# Patient Record
Sex: Female | Born: 1980 | Race: Black or African American | Hispanic: No | Marital: Single | Smoking: Never smoker
Health system: Southern US, Community
[De-identification: ages and names within clinical notes are randomized; demographics above are authoritative.]

## PROBLEM LIST (undated history)

## (undated) ENCOUNTER — Inpatient Hospital Stay (HOSPITAL_COMMUNITY): Payer: Self-pay

## (undated) DIAGNOSIS — L309 Dermatitis, unspecified: Secondary | ICD-10-CM

## (undated) DIAGNOSIS — Z3490 Encounter for supervision of normal pregnancy, unspecified, unspecified trimester: Secondary | ICD-10-CM

## (undated) DIAGNOSIS — O139 Gestational [pregnancy-induced] hypertension without significant proteinuria, unspecified trimester: Secondary | ICD-10-CM

## (undated) DIAGNOSIS — J302 Other seasonal allergic rhinitis: Secondary | ICD-10-CM

## (undated) HISTORY — PX: NO PAST SURGERIES: SHX2092

## (undated) HISTORY — PX: OTHER SURGICAL HISTORY: SHX169

## (undated) HISTORY — PX: UPPER GI ENDOSCOPY: SHX6162

---

## 2004-06-07 ENCOUNTER — Ambulatory Visit: Payer: Self-pay | Admitting: Internal Medicine

## 2004-06-14 ENCOUNTER — Ambulatory Visit: Payer: Self-pay | Admitting: Internal Medicine

## 2004-06-21 ENCOUNTER — Ambulatory Visit: Payer: Self-pay | Admitting: Internal Medicine

## 2004-11-28 ENCOUNTER — Ambulatory Visit: Payer: Self-pay | Admitting: Internal Medicine

## 2004-12-08 ENCOUNTER — Ambulatory Visit: Payer: Self-pay | Admitting: Internal Medicine

## 2005-05-09 ENCOUNTER — Ambulatory Visit: Payer: Self-pay | Admitting: Internal Medicine

## 2006-02-15 ENCOUNTER — Ambulatory Visit: Payer: Self-pay | Admitting: Obstetrics and Gynecology

## 2006-02-15 ENCOUNTER — Inpatient Hospital Stay (HOSPITAL_COMMUNITY): Admission: AD | Admit: 2006-02-15 | Discharge: 2006-02-15 | Payer: Self-pay | Admitting: Family Medicine

## 2006-02-23 ENCOUNTER — Other Ambulatory Visit: Admission: RE | Admit: 2006-02-23 | Discharge: 2006-02-23 | Payer: Self-pay | Admitting: Obstetrics and Gynecology

## 2006-03-26 ENCOUNTER — Inpatient Hospital Stay (HOSPITAL_COMMUNITY): Admission: AD | Admit: 2006-03-26 | Discharge: 2006-03-30 | Payer: Self-pay | Admitting: Obstetrics and Gynecology

## 2006-10-11 ENCOUNTER — Emergency Department (HOSPITAL_COMMUNITY): Admission: EM | Admit: 2006-10-11 | Discharge: 2006-10-11 | Payer: Self-pay | Admitting: Emergency Medicine

## 2009-10-04 ENCOUNTER — Emergency Department (HOSPITAL_COMMUNITY): Admission: EM | Admit: 2009-10-04 | Discharge: 2009-10-04 | Payer: Self-pay | Admitting: Emergency Medicine

## 2010-07-03 LAB — POCT I-STAT, CHEM 8
BUN: 6 mg/dL (ref 6–23)
Calcium, Ion: 1.11 mmol/L — ABNORMAL LOW (ref 1.12–1.32)
Chloride: 105 mEq/L (ref 96–112)
Hemoglobin: 12.9 g/dL (ref 12.0–15.0)
Sodium: 139 mEq/L (ref 135–145)

## 2010-07-03 LAB — URINALYSIS, ROUTINE W REFLEX MICROSCOPIC
Bilirubin Urine: NEGATIVE
Hgb urine dipstick: NEGATIVE
Urobilinogen, UA: 0.2 mg/dL (ref 0.0–1.0)
pH: 6 (ref 5.0–8.0)

## 2010-10-06 ENCOUNTER — Emergency Department (HOSPITAL_BASED_OUTPATIENT_CLINIC_OR_DEPARTMENT_OTHER)
Admission: EM | Admit: 2010-10-06 | Discharge: 2010-10-06 | Disposition: A | Discharge status: Home / Self Care | Payer: Self-pay | Attending: Emergency Medicine | Admitting: Emergency Medicine

## 2010-10-06 ENCOUNTER — Emergency Department (INDEPENDENT_AMBULATORY_CARE_PROVIDER_SITE_OTHER): Payer: Self-pay

## 2010-10-06 DIAGNOSIS — R51 Headache: Secondary | ICD-10-CM | POA: Insufficient documentation

## 2010-10-06 DIAGNOSIS — I10 Essential (primary) hypertension: Secondary | ICD-10-CM | POA: Insufficient documentation

## 2011-01-29 ENCOUNTER — Emergency Department (HOSPITAL_COMMUNITY)
Admission: EM | Admit: 2011-01-29 | Discharge: 2011-01-29 | Disposition: A | Discharge status: Home / Self Care | Payer: No Typology Code available for payment source | Attending: Emergency Medicine | Admitting: Emergency Medicine

## 2011-01-29 ENCOUNTER — Emergency Department (HOSPITAL_COMMUNITY): Payer: No Typology Code available for payment source

## 2011-01-29 DIAGNOSIS — Y92410 Unspecified street and highway as the place of occurrence of the external cause: Secondary | ICD-10-CM | POA: Insufficient documentation

## 2011-01-29 DIAGNOSIS — S2249XA Multiple fractures of ribs, unspecified side, initial encounter for closed fracture: Secondary | ICD-10-CM | POA: Insufficient documentation

## 2011-01-29 DIAGNOSIS — R109 Unspecified abdominal pain: Secondary | ICD-10-CM | POA: Insufficient documentation

## 2011-01-29 DIAGNOSIS — I10 Essential (primary) hypertension: Secondary | ICD-10-CM | POA: Insufficient documentation

## 2011-01-29 DIAGNOSIS — R599 Enlarged lymph nodes, unspecified: Secondary | ICD-10-CM | POA: Insufficient documentation

## 2011-01-29 DIAGNOSIS — Y998 Other external cause status: Secondary | ICD-10-CM | POA: Insufficient documentation

## 2011-01-29 LAB — POCT I-STAT, CHEM 8
BUN: 6 mg/dL (ref 6–23)
Calcium, Ion: 1.17 mmol/L (ref 1.12–1.32)
Chloride: 103 mEq/L (ref 96–112)
Potassium: 3.7 mEq/L (ref 3.5–5.1)

## 2011-01-29 MED ORDER — IOHEXOL 300 MG/ML  SOLN
125.0000 mL | Freq: Once | INTRAMUSCULAR | Status: AC | PRN
  Administered 2011-01-29: 125 mL via INTRAVENOUS

## 2011-01-30 ENCOUNTER — Encounter: Payer: Self-pay | Admitting: Internal Medicine

## 2011-02-14 ENCOUNTER — Encounter: Payer: Self-pay | Admitting: Internal Medicine

## 2011-02-17 ENCOUNTER — Ambulatory Visit: Payer: No Typology Code available for payment source | Admitting: Internal Medicine

## 2011-04-14 ENCOUNTER — Ambulatory Visit: Payer: No Typology Code available for payment source | Admitting: Internal Medicine

## 2011-05-01 ENCOUNTER — Encounter: Payer: Self-pay | Admitting: Internal Medicine

## 2011-05-09 ENCOUNTER — Encounter: Payer: Self-pay | Admitting: Internal Medicine

## 2011-05-11 ENCOUNTER — Ambulatory Visit (INDEPENDENT_AMBULATORY_CARE_PROVIDER_SITE_OTHER): Payer: Self-pay | Admitting: Internal Medicine

## 2011-05-11 ENCOUNTER — Encounter: Payer: Self-pay | Admitting: Internal Medicine

## 2011-05-11 DIAGNOSIS — K625 Hemorrhage of anus and rectum: Secondary | ICD-10-CM

## 2011-05-11 DIAGNOSIS — R9389 Abnormal findings on diagnostic imaging of other specified body structures: Secondary | ICD-10-CM

## 2011-05-11 DIAGNOSIS — R933 Abnormal findings on diagnostic imaging of other parts of digestive tract: Secondary | ICD-10-CM

## 2011-05-11 HISTORY — DX: Hemorrhage of anus and rectum: K62.5

## 2011-05-11 MED ORDER — PEG-KCL-NACL-NASULF-NA ASC-C 100 G PO SOLR: 1.0000 | Freq: Once | ORAL | Status: DC

## 2011-05-11 NOTE — Patient Instructions (Signed)
You have been scheduled for a colonoscopy. Please follow written instructions given to you at your visit today.  Please pick up your prep kit at the pharmacy within the next 2-3 days.  You have been given a sample of Suprep, please follow the instructions you were given today at your visit.
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
## 2011-05-30 ENCOUNTER — Ambulatory Visit (AMBULATORY_SURGERY_CENTER): Payer: Self-pay | Admitting: Internal Medicine

## 2011-05-30 ENCOUNTER — Encounter: Payer: Self-pay | Admitting: Internal Medicine

## 2011-05-30 DIAGNOSIS — K625 Hemorrhage of anus and rectum: Secondary | ICD-10-CM

## 2011-05-30 DIAGNOSIS — R9389 Abnormal findings on diagnostic imaging of other specified body structures: Secondary | ICD-10-CM

## 2011-05-30 DIAGNOSIS — R933 Abnormal findings on diagnostic imaging of other parts of digestive tract: Secondary | ICD-10-CM

## 2011-05-30 MED ORDER — SODIUM CHLORIDE 0.9 % IV SOLN: 500.0000 mL | INTRAVENOUS | Status: DC

## 2011-05-30 NOTE — Progress Notes (Signed)
Patient did not have preoperative order for IV antibiotic SSI prophylaxis. (G8918)  Patient did not experience any of the following events: a burn prior to discharge; a fall within the facility; wrong site/side/patient/procedure/implant event; or a hospital transfer or hospital admission upon discharge from the facility. (G8907)  

## 2011-05-30 NOTE — Progress Notes (Signed)
FOLLOW DISCHARGE INSTRUCTIONS (BLUE AND GREEN SHEETS).. 

## 2011-05-30 NOTE — Progress Notes (Signed)
No complaints noted in the recovery room. Maw   

## 2011-05-30 NOTE — Op Note (Signed)
[facility] [address] [place], [place]  [address]  COLONOSCOPY PROCEDURE REPORT  PATIENT:  Holly [name], Holly [name]  MR#:  [id number] BIRTHDATE:  [birth year], 30 yrs. old  GENDER:  female ENDOSCOPIST:  Carie Caddy. Novis League, MD  PROCEDURE DATE:  05/30/2011 PROCEDURE:  Colonoscopy 19147 ASA CLASS:  Class I INDICATIONS:  Abnormal CT of abdomen, rectal bleeding MEDICATIONS:   propofol (Diprivan) 310 mg IV, MAC sedation, administered by CRNA  DESCRIPTION OF PROCEDURE:   After the risks benefits and alternatives of the procedure were thoroughly explained, informed consent was obtained.  Digital rectal exam was performed and revealed no rectal masses.   The [id number] endoscope was introduced through the anus and advanced to the terminal ileum which was intubated for a short distance, without limitations. The quality of the prep was good, using MoviPrep.  The instrument was then slowly withdrawn as the colon was fully examined. <<PROCEDUREIMAGES>>  FINDINGS:  The terminal ileum appeared normal.  A normal appearing cecum, ileocecal valve, and appendiceal orifice were identified. The ascending, hepatic flexure, transverse, splenic flexure, descending, sigmoid colon, and rectum appeared unremarkable. Retroflexed views in the rectum revealed no abnormalities.   The scope was then withdrawn from the cecum and the procedure completed.  COMPLICATIONS:  None ENDOSCOPIC IMPRESSION: 1) Normal terminal ileum 2) Normal colon  RECOMMENDATIONS: 1) Can follow-up in GI clinic as needed.  Carie Caddy. Crissy Mccreadie, MD  CC:  The Patient  n. eSIGNED:   Carie Caddy. Deeanna Beightol at 05/30/2011 12:27 PM  Jeralyn Ruths, [phone]

## 2011-05-30 NOTE — Patient Instructions (Addendum)
  DISCHARGE INSTRUCTIONS (BLUE AND GREEN SHEETS).

## 2011-05-31 ENCOUNTER — Telehealth: Payer: Self-pay | Admitting: *Deleted

## 2011-05-31 NOTE — Telephone Encounter (Signed)
  Follow up Call-  Call back number 05/30/2011  Post procedure Call Back phone  # [phone] cell  Permission to leave phone message Yes     Patient questions:  Do you have a fever, pain , or abdominal swelling? no Pain Score  0 *  Have you tolerated food without any problems? yes  Have you been able to return to your normal activities? yes  Do you have any questions about your discharge instructions: Diet   no Medications  no Follow up visit  no  Do you have questions or concerns about your Care? no  Actions: * If pain score is 4 or above: No action needed, pain <4.

## 2011-12-29 ENCOUNTER — Encounter (HOSPITAL_COMMUNITY): Payer: Self-pay | Admitting: Emergency Medicine

## 2011-12-29 ENCOUNTER — Emergency Department (HOSPITAL_COMMUNITY)
Admission: EM | Admit: 2011-12-29 | Discharge: 2011-12-30 | Disposition: A | Discharge status: Home / Self Care | Payer: Medicaid Other | Attending: Emergency Medicine | Admitting: Emergency Medicine

## 2011-12-29 DIAGNOSIS — N390 Urinary tract infection, site not specified: Secondary | ICD-10-CM | POA: Insufficient documentation

## 2011-12-29 DIAGNOSIS — Z800 Family history of malignant neoplasm of digestive organs: Secondary | ICD-10-CM | POA: Insufficient documentation

## 2011-12-29 DIAGNOSIS — Z888 Allergy status to other drugs, medicaments and biological substances status: Secondary | ICD-10-CM | POA: Insufficient documentation

## 2011-12-29 DIAGNOSIS — I10 Essential (primary) hypertension: Secondary | ICD-10-CM | POA: Insufficient documentation

## 2011-12-29 DIAGNOSIS — Z841 Family history of disorders of kidney and ureter: Secondary | ICD-10-CM | POA: Insufficient documentation

## 2011-12-29 DIAGNOSIS — Z331 Pregnant state, incidental: Secondary | ICD-10-CM | POA: Insufficient documentation

## 2011-12-29 DIAGNOSIS — Z3490 Encounter for supervision of normal pregnancy, unspecified, unspecified trimester: Secondary | ICD-10-CM

## 2011-12-29 DIAGNOSIS — Z801 Family history of malignant neoplasm of trachea, bronchus and lung: Secondary | ICD-10-CM | POA: Insufficient documentation

## 2011-12-29 DIAGNOSIS — Z833 Family history of diabetes mellitus: Secondary | ICD-10-CM | POA: Insufficient documentation

## 2011-12-29 HISTORY — DX: Encounter for supervision of normal pregnancy, unspecified, unspecified trimester: Z34.90

## 2011-12-29 LAB — BASIC METABOLIC PANEL
Chloride: 99 mEq/L (ref 96–112)
GFR calc Af Amer: >90 mL/min (ref >90)
Potassium: 3.5 mEq/L (ref 3.5–5.1)
Sodium: 134 mEq/L — ABNORMAL LOW (ref 135–145)

## 2011-12-29 LAB — PREGNANCY, URINE: Preg Test, Ur: POSITIVE — AB

## 2011-12-29 LAB — URINALYSIS, ROUTINE W REFLEX MICROSCOPIC
Bilirubin Urine: NEGATIVE
Glucose, UA: NEGATIVE mg/dL
Nitrite: POSITIVE — AB
Urobilinogen, UA: 0.2 mg/dL (ref 0.0–1.0)

## 2011-12-29 LAB — CBC
MCH: 25.9 pg — ABNORMAL LOW (ref 26.0–34.0)
Platelets: 377 10*3/uL (ref 150–400)

## 2011-12-29 LAB — URINE MICROSCOPIC-ADD ON

## 2011-12-29 MED ORDER — ONDANSETRON HCL 4 MG/2ML IJ SOLN
4.0000 mg | Freq: Once | INTRAMUSCULAR | Status: AC
  Administered 2011-12-29: 4 mg via INTRAVENOUS
  Filled 2011-12-29: qty 2

## 2011-12-29 MED ORDER — SODIUM CHLORIDE 0.9 % IV BOLUS (SEPSIS)
1000.0000 mL | Freq: Once | INTRAVENOUS | Status: AC
  Administered 2011-12-29: 1000 mL via INTRAVENOUS

## 2011-12-29 NOTE — ED Provider Notes (Addendum)
History     CSN: [id number]  Arrival date & time 12/29/11  1827   First MD Initiated Contact with Patient 12/29/11 2208      Chief Complaint  Patient presents with  . Emesis    (Consider location/radiation/quality/duration/timing/severity/associated sxs/prior treatment) HPI Comments: Patient states she is approximately [redacted] weeks pregnant by a home pregnancy test.  She reports, that she has suprapubic pressure urinary frequency and dysuria, nausea, and vomiting for the past 36, hours.  She's noticed that this evening.  She had some streaks of blood in her emesis as well.  She has not established an OB/GYN care as of yet  Patient is a 31 y.o. female presenting with vomiting. The history is provided by the patient.  Emesis  This is a new problem. The current episode started yesterday. The problem occurs more than 10 times per day. The problem has not changed since onset.There has been no fever. Associated symptoms include abdominal pain. Pertinent negatives include no chills, no fever and no headaches.    Past Medical History  Diagnosis Date  . Hypertension   . Pregnancy     Past Surgical History  Procedure Date  . No past surgeries   . C-sectionx1     Family History  Problem Relation Age of Onset  . Colon cancer Neg Hx   . Esophageal cancer Neg Hx   . Stomach cancer Neg Hx   . Lung cancer Paternal Grandmother   . Diabetes Maternal Grandfather   . Kidney disease Paternal Uncle     History  Substance Use Topics  . Smoking status: Never Smoker   . Smokeless tobacco: Never Used  . Alcohol Use: No    OB History    Grav Para Term Preterm Abortions TAB SAB Ect Mult Living                  Review of Systems  Constitutional: Negative for fever, chills and appetite change.  HENT: Negative for rhinorrhea.   Respiratory: Negative for shortness of breath.   Gastrointestinal: Positive for vomiting and abdominal pain. Negative for nausea.  Genitourinary: Negative for  dysuria, flank pain, vaginal bleeding and vaginal discharge.  Musculoskeletal: Negative for back pain.  Skin: Negative for wound.  Neurological: Negative for dizziness, weakness and headaches.    Allergies  Naproxen sodium  Home Medications   Current Outpatient Rx  Name Route Sig Dispense Refill  . BISMUTH SUBSALICYLATE 262 MG/15ML PO SUSP Oral Take 15 mLs by mouth every 6 (six) hours as needed. Indigestion    . AMOXICILLIN 500 MG PO CAPS Oral Take 1 capsule (500 mg total) by mouth 3 (three) times daily. 30 capsule 0  . PRENATAL COMPLETE 14-0.4 MG PO TABS Oral Take 1 tablet by mouth every morning. 60 each 0    BP 117/50  Pulse 83  Temp 99.7 F (37.6 C) (Oral)  Resp 18  Ht 5\' 5"  (1.651 m)  Wt 260 lb (117.935 kg)  BMI 43.27 kg/m2  SpO2 100%  LMP 08/13/2011  Physical Exam  Constitutional: She appears well-developed and well-nourished.  HENT:  Head: Normocephalic.  Eyes: Pupils are equal, round, and reactive to light.  Neck: Normal range of motion.  Cardiovascular: Normal rate.   Abdominal: Soft. Bowel sounds are normal. She exhibits no distension. There is no tenderness.  Neurological: She is alert.  Skin: Skin is warm.    ED Course  Procedures (including critical care time)  Labs Reviewed  URINALYSIS, ROUTINE W REFLEX MICROSCOPIC -  Abnormal; Notable for the following:    APPearance CLOUDY (*)     Ketones, ur TRACE (*)     Nitrite POSITIVE (*)     Leukocytes, UA MODERATE (*)     All other components within normal limits  CBC - Abnormal; Notable for the following:    WBC 11.8 (*)     Hemoglobin 11.0 (*)     HCT 33.9 (*)     MCH 25.9 (*)     RDW 16.6 (*)     All other components within normal limits  BASIC METABOLIC PANEL - Abnormal; Notable for the following:    Sodium 134 (*)     BUN 4 (*)     All other components within normal limits  PREGNANCY, URINE - Abnormal; Notable for the following:    Preg Test, Ur POSITIVE (*)     All other components within  normal limits  URINE MICROSCOPIC-ADD ON - Abnormal; Notable for the following:    Squamous Epithelial / LPF FEW (*)     Bacteria, UA MANY (*)     All other components within normal limits   No results found.   1. UTI (lower urinary tract infection)   2. Pregnancy       MDM  Urine reveals a UTI.  Patient has been hydrated, given antiemetic.  She is now tolerating by mouth his will be discharged home with antibiotic, referral for OB/GYN                Arman Filter, NP 12/30/11 0256  Arman Filter, NP 02/01/12 1052

## 2011-12-29 NOTE — ED Notes (Signed)
Pt made aware that we need a urine sample.  Gave pt call bell and instructed her to call when she has to go.  Will check back shortly.

## 2011-12-29 NOTE — ED Notes (Addendum)
Patient c/o vomiting since last night.  Patient denies fevers.  Patient c/o lower abdominal pain of 8/10-  Patient states she is [redacted] wks pregnant.

## 2011-12-30 MED ORDER — PRENATAL COMPLETE 14-0.4 MG PO TABS: 1.0000 | ORAL_TABLET | Freq: Every morning | ORAL | Status: DC

## 2011-12-30 MED ORDER — AMOXICILLIN 500 MG PO CAPS
500.0000 mg | ORAL_CAPSULE | Freq: Once | ORAL | Status: AC
  Administered 2011-12-30: 500 mg via ORAL
  Filled 2011-12-30: qty 1

## 2011-12-30 MED ORDER — AMOXICILLIN 500 MG PO CAPS: 500.0000 mg | ORAL_CAPSULE | Freq: Three times a day (TID) | ORAL | Status: AC

## 2011-12-30 NOTE — ED Provider Notes (Signed)
Medical screening examination/treatment/procedure(s) were performed by non-physician practitioner and as supervising physician I was immediately available for consultation/collaboration.  Olivia Mackie, MD 12/30/11 [phone]

## 2012-01-03 NOTE — ED Notes (Signed)
Pt sts lost antibiotic Rx.  amoxicillin (AMOXIL) 500 MG capsule called into [facility] at [phone] at pt's request.

## 2012-02-06 NOTE — ED Provider Notes (Signed)
Medical screening examination/treatment/procedure(s) were performed by non-physician practitioner and as supervising physician I was immediately available for consultation/collaboration.   Nicolaos Mitrano, MD 02/06/12 1604 

## 2012-03-26 ENCOUNTER — Inpatient Hospital Stay (HOSPITAL_COMMUNITY): Payer: Medicaid Other

## 2012-03-26 ENCOUNTER — Observation Stay (HOSPITAL_COMMUNITY)
Admission: AD | Admit: 2012-03-26 | Discharge: 2012-03-29 | Disposition: A | Discharge status: Home / Self Care | Payer: Medicaid Other | Source: Ambulatory Visit | Attending: Family Medicine | Admitting: Family Medicine

## 2012-03-26 ENCOUNTER — Encounter (HOSPITAL_COMMUNITY): Payer: Self-pay

## 2012-03-26 DIAGNOSIS — O99210 Obesity complicating pregnancy, unspecified trimester: Secondary | ICD-10-CM

## 2012-03-26 DIAGNOSIS — O0930 Supervision of pregnancy with insufficient antenatal care, unspecified trimester: Secondary | ICD-10-CM

## 2012-03-26 DIAGNOSIS — O2390 Unspecified genitourinary tract infection in pregnancy, unspecified trimester: Secondary | ICD-10-CM | POA: Insufficient documentation

## 2012-03-26 DIAGNOSIS — N390 Urinary tract infection, site not specified: Secondary | ICD-10-CM | POA: Insufficient documentation

## 2012-03-26 DIAGNOSIS — O4690 Antepartum hemorrhage, unspecified, unspecified trimester: Principal | ICD-10-CM | POA: Insufficient documentation

## 2012-03-26 DIAGNOSIS — E669 Obesity, unspecified: Secondary | ICD-10-CM | POA: Insufficient documentation

## 2012-03-26 DIAGNOSIS — O4693 Antepartum hemorrhage, unspecified, third trimester: Secondary | ICD-10-CM | POA: Diagnosis present

## 2012-03-26 DIAGNOSIS — K625 Hemorrhage of anus and rectum: Secondary | ICD-10-CM | POA: Insufficient documentation

## 2012-03-26 DIAGNOSIS — A498 Other bacterial infections of unspecified site: Secondary | ICD-10-CM | POA: Insufficient documentation

## 2012-03-26 HISTORY — DX: Gestational (pregnancy-induced) hypertension without significant proteinuria, unspecified trimester: O13.9

## 2012-03-26 LAB — CBC
HCT: 30 % — ABNORMAL LOW (ref 36.0–46.0)
MCHC: 31.7 g/dL (ref 30.0–36.0)
MCV: 84 fL (ref 78.0–100.0)
RDW: 16.1 % — ABNORMAL HIGH (ref 11.5–15.5)
WBC: 8 10*3/uL (ref 4.0–10.5)

## 2012-03-26 LAB — DIFFERENTIAL
Basophils Absolute: 0 10*3/uL (ref 0.0–0.1)
Eosinophils Relative: 1 % (ref 0–5)
Lymphocytes Relative: 25 % (ref 12–46)
Monocytes Absolute: 0.4 10*3/uL (ref 0.1–1.0)

## 2012-03-26 LAB — RAPID URINE DRUG SCREEN, HOSP PERFORMED
Amphetamines: NOT DETECTED
Benzodiazepines: NOT DETECTED
Cocaine: NOT DETECTED
Opiates: NOT DETECTED
Tetrahydrocannabinol: NOT DETECTED

## 2012-03-26 LAB — HIV ANTIBODY (ROUTINE TESTING W REFLEX): HIV: NONREACTIVE

## 2012-03-26 LAB — PREPARE RBC (CROSSMATCH)

## 2012-03-26 LAB — OB RESULTS CONSOLE ABO/RH: RH Type: POSITIVE

## 2012-03-26 LAB — OB RESULTS CONSOLE ANTIBODY SCREEN: Antibody Screen: NEGATIVE

## 2012-03-26 MED ORDER — ZOLPIDEM TARTRATE 5 MG PO TABS: 5.0000 mg | ORAL_TABLET | Freq: Every evening | ORAL | Status: DC | PRN

## 2012-03-26 MED ORDER — PRENATAL MULTIVITAMIN CH
1.0000 | ORAL_TABLET | Freq: Every day | ORAL | Status: DC
  Administered 2012-03-27 – 2012-03-29 (×3): 1 via ORAL
  Filled 2012-03-26 (×5): qty 1

## 2012-03-26 MED ORDER — CALCIUM CARBONATE ANTACID 500 MG PO CHEW
2.0000 | CHEWABLE_TABLET | ORAL | Status: DC | PRN
  Administered 2012-03-29: 400 mg via ORAL
  Filled 2012-03-26 (×2): qty 2

## 2012-03-26 MED ORDER — DOCUSATE SODIUM 100 MG PO CAPS
100.0000 mg | ORAL_CAPSULE | Freq: Every day | ORAL | Status: DC
  Administered 2012-03-27 – 2012-03-29 (×3): 100 mg via ORAL
  Filled 2012-03-26 (×5): qty 1

## 2012-03-26 MED ORDER — ACETAMINOPHEN 325 MG PO TABS: 650.0000 mg | ORAL_TABLET | ORAL | Status: DC | PRN

## 2012-03-26 NOTE — MAU Note (Signed)
Patient states she had a positive pregnancy back in September. Does not remember last period and has no idea how far pregnant she is. Has felt movement. Has some spotting on tissue after urinating but no active bleeding and is not wearing a pad. Has had some cramping but none now.
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
## 2012-03-26 NOTE — MAU Provider Note (Signed)
Chart reviewed and agree with management and plan.  
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
## 2012-03-26 NOTE — H&P (Signed)
Chart reviewed and agree with management and plan.  

## 2012-03-27 DIAGNOSIS — O4690 Antepartum hemorrhage, unspecified, unspecified trimester: Secondary | ICD-10-CM

## 2012-03-27 DIAGNOSIS — O4693 Antepartum hemorrhage, unspecified, third trimester: Secondary | ICD-10-CM | POA: Diagnosis present

## 2012-03-27 MED ORDER — SODIUM CHLORIDE 0.9 % IJ SOLN
3.0000 mL | Freq: Two times a day (BID) | INTRAMUSCULAR | Status: DC
  Administered 2012-03-27 – 2012-03-28 (×4): 3 mL via INTRAVENOUS

## 2012-03-27 NOTE — Progress Notes (Signed)
Ur chart review completed.  

## 2012-03-27 NOTE — Progress Notes (Signed)
Patient ID: Holly [name], female   DOB: 03-19-1981, 31 y.o.   MRN: [id number] FACULTY PRACTICE ANTEPARTUM(COMPREHENSIVE) NOTE  Holly [name] is a 31 y.o. G3P1011 at [redacted]w[redacted]d by best clinical estimate who is admitted for vaginal bleeding.   Fetal presentation is cephalic. Length of Stay:  1  Days  Subjective: Still with bleeding, became mildly heavier overnight. Patient reports the fetal movement as active. Patient reports uterine contraction  activity as none. Patient reports  vaginal bleeding as spotting. Patient describes fluid per vagina as None.  Vitals:  Blood pressure 119/71, pulse 89, temperature 99.2 F (37.3 C), temperature source Oral, resp. rate 18, height 5' 3.5" (1.613 m), weight 280 lb (127.007 kg), SpO2 100.00%. Physical Examination:  General appearance - alert, well appearing, and in no distress Abdomen - soft, nontender, nondistended, no masses or organomegaly Fundal Height:  size equals dates Extremities: extremities normal, atraumatic, no cyanosis or edema  Membranes:intact  Fetal Monitoring:  Baseline: 140 bpm, Variability: Good {> 6 bpm), Accelerations: Reactive and Decelerations: Absent  Labs:  Recent Results (from the past 24 hour(s))  URINE RAPID DRUG SCREEN (HOSP PERFORMED)   Collection Time   03/26/12 10:50 AM      Component Value Range   Opiates NONE DETECTED  NONE DETECTED   Cocaine NONE DETECTED  NONE DETECTED   Benzodiazepines NONE DETECTED  NONE DETECTED   Amphetamines NONE DETECTED  NONE DETECTED   Tetrahydrocannabinol NONE DETECTED  NONE DETECTED   Barbiturates NONE DETECTED  NONE DETECTED  HEPATITIS B SURFACE ANTIGEN   Collection Time   03/26/12 11:40 AM      Component Value Range   Hepatitis B Surface Ag NEGATIVE  NEGATIVE  RPR   Collection Time   03/26/12 11:40 AM      Component Value Range   RPR NON REACTIVE  NON REACTIVE  CBC   Collection Time   03/26/12 11:40 AM      Component Value Range   WBC 8.0  4.0 - 10.5 K/uL   RBC 3.57 (*)  3.87 - 5.11 MIL/uL   Hemoglobin 9.5 (*) 12.0 - 15.0 g/dL   HCT 78.2 (*) 95.6 - 21.3 %   MCV 84.0  78.0 - 100.0 fL   MCH 26.6  26.0 - 34.0 pg   MCHC 31.7  30.0 - 36.0 g/dL   RDW 08.6 (*) 57.8 - 46.9 %   Platelets 338  150 - 400 K/uL  DIFFERENTIAL   Collection Time   03/26/12 11:40 AM      Component Value Range   Neutrophils Relative 70  43 - 77 %   Neutro Abs 5.6  1.7 - 7.7 K/uL   Lymphocytes Relative 25  12 - 46 %   Lymphs Abs 2.0  0.7 - 4.0 K/uL   Monocytes Relative 5  3 - 12 %   Monocytes Absolute 0.4  0.1 - 1.0 K/uL   Eosinophils Relative 1  0 - 5 %   Eosinophils Absolute 0.0  0.0 - 0.7 K/uL   Basophils Relative 0  0 - 1 %   Basophils Absolute 0.0  0.0 - 0.1 K/uL  TYPE AND SCREEN   Collection Time   03/26/12 11:40 AM      Component Value Range   ABO/RH(D) O POS     Antibody Screen NEG     Sample Expiration 03/29/2012     Unit Number [id number]     Blood Component Type RED CELLS,LR     Unit division 00  Status of Unit ALLOCATED     Transfusion Status OK TO TRANSFUSE     Crossmatch Result Compatible     Unit Number [id number]     Blood Component Type RBC LR PHER2     Unit division 00     Status of Unit ALLOCATED     Transfusion Status OK TO TRANSFUSE     Crossmatch Result Compatible    HIV ANTIBODY (ROUTINE TESTING)   Collection Time   03/26/12 11:40 AM      Component Value Range   HIV NON REACTIVE  NON REACTIVE  SICKLE CELL SCREEN   Collection Time   03/26/12 11:40 AM      Component Value Range   Sickle Cell Screen NEGATIVE  NEGATIVE  ABO/RH   Collection Time   03/26/12 11:40 AM      Component Value Range   ABO/RH(D) O POS    GLUCOSE TOLERANCE, 1 HOUR   Collection Time   03/26/12  3:32 PM      Component Value Range   Glucose, 1 Hour GTT 128  70 - 140 mg/dL  PREPARE RBC (CROSSMATCH)   Collection Time   03/26/12  5:00 PM      Component Value Range   Order Confirmation ORDER PROCESSED BY BLOOD BANK      Imaging Studies:    U/S shows no  reason for bleeding, nml visualized anatomy, no evidence of previa  Medications:  Scheduled    . docusate sodium  100 mg Oral Daily  . prenatal multivitamin  1 tablet Oral Daily   I have reviewed the patient's current medications.  ASSESSMENT: Patient Active Problem List  Diagnosis  . Rectal bleeding  . Abnormal finding on GI tract imaging    PLAN: Continue in hospital observation, until bleeding subsides.  Plan discussed with pt.  PRATT,TANYA S 03/27/2012,7:20 AM

## 2012-03-28 LAB — URINE CULTURE: Special Requests: NORMAL

## 2012-03-28 MED ORDER — SULFAMETHOXAZOLE-TMP DS 800-160 MG PO TABS
1.0000 | ORAL_TABLET | Freq: Two times a day (BID) | ORAL | Status: DC
  Administered 2012-03-28 – 2012-03-29 (×3): 1 via ORAL
  Filled 2012-03-28 (×3): qty 1

## 2012-03-28 NOTE — Progress Notes (Addendum)
Holly [name] is a 31 y.o. G3P1011 at 30 weeks by best clinical estimate who is admitted for vaginal bleeding.  Fetal presentation is cephalic.  Length of Stay: 2 Days  Subjective:  Her last occasion of vaginal bleeding/spotting was last night. Patient reports the fetal movement as active.  Patient reports uterine contraction activity as none.  Patient reports vaginal bleeding as spotting.  Patient describes fluid per vagina as None.  Vitals: Blood pressure 119/71, pulse 89, temperature 99.2 F (37.3 C), temperature source Oral, resp. rate 18, height 5' 3.5" (1.613 m), weight 280 lb (127.007 kg), SpO2 100.00%.  Physical Examination:  General appearance - alert, well appearing, and in no distress  Abdomen - soft, nontender, nondistended, no masses or organomegaly  Fundal Height: size equals dates  Extremities: extremities normal, atraumatic, no cyanosis or edema  Membranes:intact  Fetal Monitoring: Baseline: 140 bpm, Variability: Good {> 6 bpm), Accelerations: Reactive and Decelerations: Absent  Labs:  Recent Results (from the past 24 hour(s))   URINE RAPID DRUG SCREEN (HOSP PERFORMED)    Collection Time    03/26/12 10:50 AM   Component  Value  Range    Opiates  NONE DETECTED  NONE DETECTED    Cocaine  NONE DETECTED  NONE DETECTED    Benzodiazepines  NONE DETECTED  NONE DETECTED    Amphetamines  NONE DETECTED  NONE DETECTED    Tetrahydrocannabinol  NONE DETECTED  NONE DETECTED    Barbiturates  NONE DETECTED  NONE DETECTED   HEPATITIS B SURFACE ANTIGEN    Collection Time    03/26/12 11:40 AM   Component  Value  Range    Hepatitis B Surface Ag  NEGATIVE  NEGATIVE   RPR    Collection Time    03/26/12 11:40 AM   Component  Value  Range    RPR  NON REACTIVE  NON REACTIVE   CBC    Collection Time    03/26/12 11:40 AM   Component  Value  Range    WBC  8.0  4.0 - 10.5 K/uL    RBC  3.57 (*)  3.87 - 5.11 MIL/uL    Hemoglobin  9.5 (*)  12.0 - 15.0 g/dL    HCT  29.5 (*)  62.1 - 46.0  %    MCV  84.0  78.0 - 100.0 fL    MCH  26.6  26.0 - 34.0 pg    MCHC  31.7  30.0 - 36.0 g/dL    RDW  30.8 (*)  65.7 - 15.5 %    Platelets  338  150 - 400 K/uL   DIFFERENTIAL    Collection Time    03/26/12 11:40 AM   Component  Value  Range    Neutrophils Relative  70  43 - 77 %    Neutro Abs  5.6  1.7 - 7.7 K/uL    Lymphocytes Relative  25  12 - 46 %    Lymphs Abs  2.0  0.7 - 4.0 K/uL    Monocytes Relative  5  3 - 12 %    Monocytes Absolute  0.4  0.1 - 1.0 K/uL    Eosinophils Relative  1  0 - 5 %    Eosinophils Absolute  0.0  0.0 - 0.7 K/uL    Basophils Relative  0  0 - 1 %    Basophils Absolute  0.0  0.0 - 0.1 K/uL   TYPE AND SCREEN    Collection Time  03/26/12 11:40 AM   Component  Value  Range    ABO/RH(D)  O POS     Antibody Screen  NEG     Sample Expiration  03/29/2012     Unit Number  [id number]     Blood Component Type  RED CELLS,LR     Unit division  00     Status of Unit  ALLOCATED     Transfusion Status  OK TO TRANSFUSE     Crossmatch Result  Compatible     Unit Number  [id number]     Blood Component Type  RBC LR PHER2     Unit division  00     Status of Unit  ALLOCATED     Transfusion Status  OK TO TRANSFUSE     Crossmatch Result  Compatible    HIV ANTIBODY (ROUTINE TESTING)    Collection Time    03/26/12 11:40 AM   Component  Value  Range    HIV  NON REACTIVE  NON REACTIVE   SICKLE CELL SCREEN    Collection Time    03/26/12 11:40 AM   Component  Value  Range    Sickle Cell Screen  NEGATIVE  NEGATIVE   ABO/RH    Collection Time    03/26/12 11:40 AM   Component  Value  Range    ABO/RH(D)  O POS    GLUCOSE TOLERANCE, 1 HOUR    Collection Time    03/26/12 3:32 PM   Component  Value  Range    Glucose, 1 Hour GTT  128  70 - 140 mg/dL   PREPARE RBC (CROSSMATCH)    Collection Time    03/26/12 5:00 PM   Component  Value  Range    Order Confirmation  ORDER PROCESSED BY BLOOD BANK    Imaging Studies:  U/S shows no reason for bleeding, nml  visualized anatomy, no evidence of previa  Medications: Scheduled  .  docusate sodium  100 mg  Oral  Daily   .  prenatal multivitamin  1 tablet  Oral  Daily   I have reviewed the patient's current medications.  ASSESSMENT:  Patient Active Problem List   Diagnosis   .  Rectal bleeding - last noted a year ago. She had a normal colonoscopy last year.  .  Abnormal finding on GI tract imaging  E. Coli UTI  PLAN:  Continue in hospital observation, until bleeding subsides. Plan discussed with pt.  Start Bactrim DS

## 2012-03-29 LAB — TYPE AND SCREEN

## 2012-03-29 MED ORDER — SULFAMETHOXAZOLE-TMP DS 800-160 MG PO TABS: 1.0000 | ORAL_TABLET | Freq: Two times a day (BID) | ORAL | Status: DC

## 2012-03-29 NOTE — Discharge Summary (Signed)
Physician Discharge Summary  Patient ID: Holly [name] MRN: [id number] DOB/AGE: 11-27-1980 31 y.o.  Admit date: 03/26/2012 Discharge date: 03/29/2012  Admission Diagnoses: [redacted] weeks EGA with vaginal bleeding, no prenatal care, morbid obesity  Discharge Diagnoses: same Principal Problem:  *Pregnancy with third trimester bleeding, antepartum   Discharged Condition: good  Hospital Course: She was admitted and monitored. Fetal u/s and monitoring were reassuring. Her bleeding resolved almost immediately after admission and by HD#2 she was requesting to go home. (It is her daughter's birthday today). She reports good FM and denies VB, ROM, or CTXs. A 1 hour glucola was normal at 128. A urine culture did show E. Coli and she was started on bactrim DS BID.  Consults: None  Significant Diagnostic Studies: labs: as above  Treatments: observation  Discharge Exam: Blood pressure 124/66, pulse 86, temperature 98.1 F (36.7 C), temperature source Oral, resp. rate 18, height 5' 3.5" (1.613 m), weight 127.007 kg (280 lb), SpO2 100.00%. General appearance: alert Resp: clear to auscultation bilaterally Cardio: regular rate and rhythm, S1, S2 normal, no murmur, click, rub or gallop GI: soft, non-tender; bowel sounds normal; no masses,  no organomegaly Uterus: gravid, benign FHR- reactive  Disposition: 01-Home or Self Care  Discharge Orders    Future Appointments: Provider: Department: Dept Phone: Center:   04/08/2012 10:45 AM Reva Bores, MD [facility] [phone] [facility]       Medication List     As of 03/29/2012  7:40 AM    TAKE these medications         prenatal multivitamin Tabs   Take 1 tablet by mouth daily.      sulfamethoxazole-trimethoprim 800-160 MG per tablet   Commonly known as: BACTRIM DS   Take 1 tablet by mouth every 12 (twelve) hours.           Follow-up Information    Follow up with [facility]. In 10 days. (She already has an appt  scheduled for 10 days from now.)    Contact information:   [address] [place] [address] [phone]         Signed: Allie Bossier. 03/29/2012, 7:40 AM

## 2012-03-29 NOTE — Progress Notes (Signed)
weeks by best clinical estimate who is admitted for vaginal bleeding.  Fetal presentation is cephalic.  Length of Stay: 3 Days  Subjective:  Her last occasion of vaginal bleeding/spotting 2 days ago Patient reports the fetal movement as active.  Patient reports uterine contraction activity as none.  Patient reports vaginal bleeding as spotting.  Patient describes fluid per vagina as None.  Vitals: Blood pressure 119/71, pulse 89, temperature 99.2 F (37.3 C), temperature source Oral, resp. rate 18, height 5' 3.5" (1.613 m), weight 280 lb (127.007 kg), SpO2 100.00%.  Physical Examination:  General appearance - alert, well appearing, and in no distress  Abdomen - soft, nontender, nondistended, no masses or organomegaly  Fundal Height: size equals dates  Extremities: extremities normal, atraumatic, no cyanosis or edema  Membranes:intact  Fetal Monitoring: Baseline: 140 bpm, Variability: Good {> 6 bpm), Accelerations: Reactive and Decelerations: Absent  Labs:  Recent Results (from the past 24 hour(s))   URINE RAPID DRUG SCREEN (HOSP PERFORMED)    Collection Time    03/26/12 10:50 AM   Component  Value  Range    Opiates  NONE DETECTED  NONE DETECTED    Cocaine  NONE DETECTED  NONE DETECTED    Benzodiazepines  NONE DETECTED  NONE DETECTED    Amphetamines  NONE DETECTED  NONE DETECTED    Tetrahydrocannabinol  NONE DETECTED  NONE DETECTED    Barbiturates  NONE DETECTED  NONE DETECTED   HEPATITIS B SURFACE ANTIGEN    Collection Time    03/26/12 11:40 AM   Component  Value  Range    Hepatitis B Surface Ag  NEGATIVE  NEGATIVE   RPR    Collection Time    03/26/12 11:40 AM   Component  Value  Range    RPR  NON REACTIVE  NON REACTIVE   CBC    Collection Time    03/26/12 11:40 AM   Component  Value  Range    WBC  8.0  4.0 - 10.5 K/uL    RBC  3.57 (*)  3.87 - 5.11 MIL/uL    Hemoglobin  9.5 (*)  12.0 - 15.0 g/dL    HCT  16.1 (*)  09.6 - 46.0 %    MCV  84.0  78.0 - 100.0 fL    MCH   26.6  26.0 - 34.0 pg    MCHC  31.7  30.0 - 36.0 g/dL    RDW  04.5 (*)  40.9 - 15.5 %    Platelets  338  150 - 400 K/uL   DIFFERENTIAL    Collection Time    03/26/12 11:40 AM   Component  Value  Range    Neutrophils Relative  70  43 - 77 %    Neutro Abs  5.6  1.7 - 7.7 K/uL    Lymphocytes Relative  25  12 - 46 %    Lymphs Abs  2.0  0.7 - 4.0 K/uL    Monocytes Relative  5  3 - 12 %    Monocytes Absolute  0.4  0.1 - 1.0 K/uL    Eosinophils Relative  1  0 - 5 %    Eosinophils Absolute  0.0  0.0 - 0.7 K/uL    Basophils Relative  0  0 - 1 %    Basophils Absolute  0.0  0.0 - 0.1 K/uL   TYPE AND SCREEN    Collection Time    03/26/12 11:40 AM   Component  Value  Range    ABO/RH(D)  O POS     Antibody Screen  NEG     Sample Expiration  03/29/2012     Unit Number  [id number]     Blood Component Type  RED CELLS,LR     Unit division  00     Status of Unit  ALLOCATED     Transfusion Status  OK TO TRANSFUSE     Crossmatch Result  Compatible     Unit Number  [id number]     Blood Component Type  RBC LR PHER2     Unit division  00     Status of Unit  ALLOCATED     Transfusion Status  OK TO TRANSFUSE     Crossmatch Result  Compatible    HIV ANTIBODY (ROUTINE TESTING)    Collection Time    03/26/12 11:40 AM   Component  Value  Range    HIV  NON REACTIVE  NON REACTIVE   SICKLE CELL SCREEN    Collection Time    03/26/12 11:40 AM   Component  Value  Range    Sickle Cell Screen  NEGATIVE  NEGATIVE   ABO/RH    Collection Time    03/26/12 11:40 AM   Component  Value  Range    ABO/RH(D)  O POS    GLUCOSE TOLERANCE, 1 HOUR    Collection Time    03/26/12 3:32 PM   Component  Value  Range    Glucose, 1 Hour GTT  128  70 - 140 mg/dL   PREPARE RBC (CROSSMATCH)    Collection Time    03/26/12 5:00 PM   Component  Value  Range    Order Confirmation  ORDER PROCESSED BY BLOOD BANK    Imaging Studies:  U/S shows no reason for bleeding, nml visualized anatomy, no evidence of previa   Medications: Scheduled  .  docusate sodium  100 mg  Oral  Daily   .  prenatal multivitamin  1 tablet  Oral  Daily   I have reviewed the patient's current medications.  ASSESSMENT:  Patient Active Problem List   Diagnosis   .  Rectal bleeding - last noted a year ago. She had a normal colonoscopy last year.   .  Abnormal finding on GI tract imaging  E. Coli UTI   PLAN: Discharge home today. She already has an appt scheduled in the high risk clinic in 10 days.  Plan discussed with pt.  Continue Bactrim DS for 5 more days.

## 2012-04-08 ENCOUNTER — Encounter: Payer: Self-pay | Admitting: Family Medicine

## 2012-04-08 ENCOUNTER — Ambulatory Visit (INDEPENDENT_AMBULATORY_CARE_PROVIDER_SITE_OTHER): Payer: Medicaid Other | Admitting: Family Medicine

## 2012-04-08 ENCOUNTER — Other Ambulatory Visit (HOSPITAL_COMMUNITY)
Admission: RE | Admit: 2012-04-08 | Discharge: 2012-04-08 | Disposition: A | Discharge status: Home / Self Care | Payer: Medicaid Other | Source: Ambulatory Visit | Attending: Family Medicine | Admitting: Family Medicine

## 2012-04-08 VITALS — BP 130/85 | Temp 97.0°F | Wt 275.0 lb

## 2012-04-08 DIAGNOSIS — Z1151 Encounter for screening for human papillomavirus (HPV): Secondary | ICD-10-CM | POA: Insufficient documentation

## 2012-04-08 DIAGNOSIS — O34219 Maternal care for unspecified type scar from previous cesarean delivery: Secondary | ICD-10-CM | POA: Insufficient documentation

## 2012-04-08 DIAGNOSIS — O0990 Supervision of high risk pregnancy, unspecified, unspecified trimester: Secondary | ICD-10-CM | POA: Insufficient documentation

## 2012-04-08 DIAGNOSIS — Z01419 Encounter for gynecological examination (general) (routine) without abnormal findings: Secondary | ICD-10-CM | POA: Insufficient documentation

## 2012-04-08 DIAGNOSIS — Z113 Encounter for screening for infections with a predominantly sexual mode of transmission: Secondary | ICD-10-CM | POA: Insufficient documentation

## 2012-04-08 LAB — POCT URINALYSIS DIP (DEVICE)
Glucose, UA: NEGATIVE mg/dL
Nitrite: NEGATIVE
Urobilinogen, UA: 0.2 mg/dL (ref 0.0–1.0)

## 2012-04-08 NOTE — Patient Instructions (Addendum)
Contraception Choices  Contraception (birth control) is the use of any methods or devices to prevent pregnancy. Below are some methods to help avoid pregnancy.  HORMONAL METHODS   · Contraceptive implant. This is a thin, plastic tube containing progesterone hormone. It does not contain estrogen hormone. Your caregiver inserts the tube in the inner part of the upper arm. The tube can remain in place for up to 3 years. After 3 years, the implant must be removed. The implant prevents the ovaries from releasing an egg (ovulation), thickens the cervical mucus which prevents sperm from entering the uterus, and thins the lining of the inside of the uterus.  · Progesterone-only injections. These injections are given every 3 months by your caregiver to prevent pregnancy. This synthetic progesterone hormone stops the ovaries from releasing eggs. It also thickens cervical mucus and changes the uterine lining. This makes it harder for sperm to survive in the uterus.  · Birth control pills. These pills contain estrogen and progesterone hormone. They work by stopping the egg from forming in the ovary (ovulation). Birth control pills are prescribed by a caregiver. Birth control pills can also be used to treat heavy periods.  · Minipill. This type of birth control pill contains only the progesterone hormone. They are taken every day of each month and must be prescribed by your caregiver.  · Birth control patch. The patch contains hormones similar to those in birth control pills. It must be changed once a week and is prescribed by a caregiver.  · Vaginal ring. The ring contains hormones similar to those in birth control pills. It is left in the vagina for 3 weeks, removed for 1 week, and then a new one is put back in place. The patient must be comfortable inserting and removing the ring from the vagina. A caregiver's prescription is necessary.  · Emergency contraception. Emergency contraceptives prevent pregnancy after unprotected  sexual intercourse. This pill can be taken right after sex or up to 5 days after unprotected sex. It is most effective the sooner you take the pills after having sexual intercourse. Emergency contraceptive pills are available without a prescription. Check with your pharmacist. Do not use emergency contraception as your only form of birth control.  BARRIER METHODS   · Female condom. This is a thin sheath (latex or rubber) that is worn over the penis during sexual intercourse. It can be used with spermicide to increase effectiveness.  · Female condom. This is a soft, loose-fitting sheath that is put into the vagina before sexual intercourse.  · Diaphragm. This is a soft, latex, dome-shaped barrier that must be fitted by a caregiver. It is inserted into the vagina, along with a spermicidal jelly. It is inserted before intercourse. The diaphragm should be left in the vagina for 6 to 8 hours after intercourse.  · Cervical cap. This is a round, soft, latex or plastic cup that fits over the cervix and must be fitted by a caregiver. The cap can be left in place for up to 48 hours after intercourse.  · Sponge. This is a soft, circular piece of polyurethane foam. The sponge has spermicide in it. It is inserted into the vagina after wetting it and before sexual intercourse.  · Spermicides. These are chemicals that kill or block sperm from entering the cervix and uterus. They come in the form of creams, jellies, suppositories, foam, or tablets. They do not require a prescription. They are inserted into the vagina with an applicator before having sexual intercourse.   IUD). This is a T-shaped device that is put in a woman's uterus during a menstrual period to prevent pregnancy. There are 2 types:  Copper IUD. This type of IUD is wrapped in copper wire and is placed inside the uterus. Copper makes the uterus and  fallopian tubes produce a fluid that kills sperm. It can stay in place for 10 years.  Hormone IUD. This type of IUD contains the hormone progestin (synthetic progesterone). The hormone thickens the cervical mucus and prevents sperm from entering the uterus, and it also thins the uterine lining to prevent implantation of a fertilized egg. The hormone can weaken or kill the sperm that get into the uterus. It can stay in place for 5 years. PERMANENT METHODS OF CONTRACEPTION  Female tubal ligation. This is when the woman's fallopian tubes are surgically sealed, tied, or blocked to prevent the egg from traveling to the uterus.  Female sterilization. This is when the female has the tubes that carry sperm tied off (vasectomy).This blocks sperm from entering the vagina during sexual intercourse. After the procedure, the man can still ejaculate fluid (semen). NATURAL PLANNING METHODS  Natural family planning. This is not having sexual intercourse or using a barrier method (condom, diaphragm, cervical cap) on days the woman could become pregnant.  Calendar method. This is keeping track of the length of each menstrual cycle and identifying when you are fertile.  Ovulation method. This is avoiding sexual intercourse during ovulation.  Symptothermal method. This is avoiding sexual intercourse during ovulation, using a thermometer and ovulation symptoms.  Post-ovulation method. This is timing sexual intercourse after you have ovulated. Regardless of which type or method of contraception you choose, it is important that you use condoms to protect against the transmission of sexually transmitted diseases (STDs). Talk with your caregiver about which form of contraception is most appropriate for you. Document Released: 04/03/2005 Document Revised: 06/26/2011 Document Reviewed: 08/10/2010 [facility] Patient Information 2013 [place], [place].  Pregnancy - Third Trimester The third trimester of pregnancy (the last 3  months) is a period of the most rapid growth for you and your baby. The baby approaches a length of 20 inches and a weight of 6 to 10 pounds. The baby is adding on fat and getting ready for life outside your body. While inside, babies have periods of sleeping and waking, suck their thumbs, and hiccups. You can often feel small contractions of the uterus. This is false labor. It is also called Braxton-Hicks contractions. This is like a practice for labor. The usual problems in this stage of pregnancy include more difficulty breathing, swelling of the hands and feet from water retention, and having to urinate more often because of the uterus and baby pressing on your bladder.  PRENATAL EXAMS  Blood work may continue to be done during prenatal exams. These tests are done to check on your health and the probable health of your baby. Blood work is used to follow your blood levels (hemoglobin). Anemia (low hemoglobin) is common during pregnancy. Iron and vitamins are given to help prevent this. You may also continue to be checked for diabetes. Some of the past blood tests may be done again.  The size of the uterus is measured during each visit. This makes sure your baby is growing properly according to your pregnancy dates.  Your blood pressure is checked every prenatal visit. This is to make sure you are not getting toxemia.  Your urine is checked every prenatal visit for infection, diabetes and protein.  Your weight is checked at each visit. This is done to make sure gains are happening at the suggested rate and that you and your baby are growing normally.  Sometimes, an ultrasound is performed to confirm the position and the proper growth and development of the baby. This is a test done that bounces harmless sound waves off the baby so your caregiver can more accurately determine due dates.  Discuss the type of pain medication and anesthesia you will have during your labor and delivery.  Discuss the  possibility and anesthesia if a Cesarean Section might be necessary.  Inform your caregiver if there is any mental or physical violence at home. Sometimes, a specialized non-stress test, contraction stress test and biophysical profile are done to make sure the baby is not having a problem. Checking the amniotic fluid surrounding the baby is called an amniocentesis. The amniotic fluid is removed by sticking a needle into the belly (abdomen). This is sometimes done near the end of pregnancy if an early delivery is required. In this case, it is done to help make sure the baby's lungs are mature enough for the baby to live outside of the womb. If the lungs are not mature and it is unsafe to deliver the baby, an injection of cortisone medication is given to the mother 1 to 2 days before the delivery. This helps the baby's lungs mature and makes it safer to deliver the baby. CHANGES OCCURING IN THE THIRD TRIMESTER OF PREGNANCY Your body goes through many changes during pregnancy. They vary from person to person. Talk to your caregiver about changes you notice and are concerned about.  During the last trimester, you have probably had an increase in your appetite. It is normal to have cravings for certain foods. This varies from person to person and pregnancy to pregnancy.  You may begin to get stretch marks on your hips, abdomen, and breasts. These are normal changes in the body during pregnancy. There are no exercises or medications to take which prevent this change.  Constipation may be treated with a stool softener or adding bulk to your diet. Drinking lots of fluids, fiber in vegetables, fruits, and whole grains are helpful.  Exercising is also helpful. If you have been very active up until your pregnancy, most of these activities can be continued during your pregnancy. If you have been less active, it is helpful to start an exercise program such as walking. Consult your caregiver before starting exercise  programs.  Avoid all smoking, alcohol, un-prescribed drugs, herbs and "street drugs" during your pregnancy. These chemicals affect the formation and growth of the baby. Avoid chemicals throughout the pregnancy to ensure the delivery of a healthy infant.  Backache, varicose veins and hemorrhoids may develop or get worse.  You will tire more easily in the third trimester, which is normal.  The baby's movements may be stronger and more often.  You may become short of breath easily.  Your belly button may stick out.  A yellow discharge may leak from your breasts called colostrum.  You may have a bloody mucus discharge. This usually occurs a few days to a week before labor begins. HOME CARE INSTRUCTIONS   Keep your caregiver's appointments. Follow your caregiver's instructions regarding medication use, exercise, and diet.  During pregnancy, you are providing food for you and your baby. Continue to eat regular, well-balanced meals. Choose foods such as meat, fish, milk and other low fat dairy products, vegetables, fruits, and whole-grain breads and  cereals. Your caregiver will tell you of the ideal weight gain.  A physical sexual relationship may be continued throughout pregnancy if there are no other problems such as early (premature) leaking of amniotic fluid from the membranes, vaginal bleeding, or belly (abdominal) pain.  Exercise regularly if there are no restrictions. Check with your caregiver if you are unsure of the safety of your exercises. Greater weight gain will occur in the last 2 trimesters of pregnancy. Exercising helps:  Control your weight.  Get you in shape for labor and delivery.  You lose weight after you deliver.  Rest a lot with legs elevated, or as needed for leg cramps or low back pain.  Wear a good support or jogging bra for breast tenderness during pregnancy. This may help if worn during sleep. Pads or tissues may be used in the bra if you are leaking  colostrum.  Do not use hot tubs, steam rooms, or saunas.  Wear your seat belt when driving. This protects you and your baby if you are in an accident.  Avoid raw meat, cat litter boxes and soil used by cats. These carry germs that can cause birth defects in the baby.  It is easier to loose urine during pregnancy. Tightening up and strengthening the pelvic muscles will help with this problem. You can practice stopping your urination while you are going to the bathroom. These are the same muscles you need to strengthen. It is also the muscles you would use if you were trying to stop from passing gas. You can practice tightening these muscles up 10 times a set and repeating this about 3 times per day. Once you know what muscles to tighten up, do not perform these exercises during urination. It is more likely to cause an infection by backing up the urine.  Ask for help if you have financial, counseling or nutritional needs during pregnancy. Your caregiver will be able to offer counseling for these needs as well as refer you for other special needs.  Make a list of emergency phone numbers and have them available.  Plan on getting help from family or friends when you go home from the hospital.  Make a trial run to the hospital.  Take prenatal classes with the father to understand, practice and ask questions about the labor and delivery.  Prepare the baby's room/nursery.  Do not travel out of the city unless it is absolutely necessary and with the advice of your caregiver.  Wear only low or no heal shoes to have better balance and prevent falling. MEDICATIONS AND DRUG USE IN PREGNANCY  Take prenatal vitamins as directed. The vitamin should contain 1 milligram of folic acid. Keep all vitamins out of reach of children. Only a couple vitamins or tablets containing iron may be fatal to a baby or young child when ingested.  Avoid use of all medications, including herbs, over-the-counter medications,  not prescribed or suggested by your caregiver. Only take over-the-counter or prescription medicines for pain, discomfort, or fever as directed by your caregiver. Do not use aspirin, ibuprofen (Motrin, Advil, Nuprin) or naproxen (Aleve) unless OK'd by your caregiver.  Let your caregiver also know about herbs you may be using.  Alcohol is related to a number of birth defects. This includes fetal alcohol syndrome. All alcohol, in any form, should be avoided completely. Smoking will cause low birth rate and premature babies.  Street/illegal drugs are very harmful to the baby. They are absolutely forbidden. A baby born to an  addicted mother will be addicted at birth. The baby will go through the same withdrawal an adult does. SEEK MEDICAL CARE IF: You have any concerns or worries during your pregnancy. It is better to call with your questions if you feel they cannot wait, rather than worry about them. DECISIONS ABOUT CIRCUMCISION You may or may not know the sex of your baby. If you know your baby is a boy, it may be time to think about circumcision. Circumcision is the removal of the foreskin of the penis. This is the skin that covers the sensitive end of the penis. There is no proven medical need for this. Often this decision is made on what is popular at the time or based upon religious beliefs and social issues. You can discuss these issues with your caregiver or pediatrician. SEEK IMMEDIATE MEDICAL CARE IF:   An unexplained oral temperature above 102 F (38.9 C) develops, or as your caregiver suggests.  You have leaking of fluid from the vagina (birth canal). If leaking membranes are suspected, take your temperature and tell your caregiver of this when you call.  There is vaginal spotting, bleeding or passing clots. Tell your caregiver of the amount and how many pads are used.  You develop a bad smelling vaginal discharge with a change in the color from clear to white.  You develop vomiting  that lasts more than 24 hours.  You develop chills or fever.  You develop shortness of breath.  You develop burning on urination.  You loose more than 2 pounds of weight or gain more than 2 pounds of weight or as suggested by your caregiver.  You notice sudden swelling of your face, hands, and feet or legs.  You develop belly (abdominal) pain. Round ligament discomfort is a common non-cancerous (benign) cause of abdominal pain in pregnancy. Your caregiver still must evaluate you.  You develop a severe headache that does not go away.  You develop visual problems, blurred or double vision.  If you have not felt your baby move for more than 1 hour. If you think the baby is not moving as much as usual, eat something with sugar in it and lie down on your left side for an hour. The baby should move at least 4 to 5 times per hour. Call right away if your baby moves less than that.  You fall, are in a car accident or any kind of trauma.  There is mental or physical violence at home. Document Released: 03/28/2001 Document Revised: 06/26/2011 Document Reviewed: 09/30/2008 [facility] Patient Information 2013 [place], [place].  Breastfeeding Deciding to breastfeed is one of the best choices you can make for you and your baby. The information that follows gives a brief overview of the benefits of breastfeeding as well as common topics surrounding breastfeeding. BENEFITS OF BREASTFEEDING For the baby  The first milk (colostrum) helps the baby's digestive system function better.   There are antibodies in the mother's milk that help the baby fight off infections.   The baby has a lower incidence of asthma, allergies, and sudden infant death syndrome (SIDS).   The nutrients in breast milk are better for the baby than infant formulas, and breast milk helps the baby's brain grow better.   Babies who breastfeed have less gas, colic, and constipation.  For the mother  Breastfeeding helps  develop a very special bond between the mother and her baby.   Breastfeeding is convenient, always available at the correct temperature, and costs nothing.  Breastfeeding burns calories in the mother and helps her lose weight that was gained during pregnancy.   Breastfeeding makes the uterus contract back down to normal size faster and slows bleeding following delivery.   Breastfeeding mothers have a lower risk of developing breast cancer.  BREASTFEEDING FREQUENCY  A healthy, full-term baby may breastfeed as often as every hour or space his or her feedings to every 3 hours.   Watch your baby for signs of hunger. Nurse your baby if he or she shows signs of hunger. How often you nurse will vary from baby to baby.   Nurse as often as the baby requests, or when you feel the need to reduce the fullness of your breasts.   Awaken the baby if it has been 3 4 hours since the last feeding.   Frequent feeding will help the mother make more milk and will help prevent problems, such as sore nipples and engorgement of the breasts.  BABY'S POSITION AT THE BREAST  Whether lying down or sitting, be sure that the baby's tummy is facing your tummy.   Support the breast with 4 fingers underneath the breast and the thumb above. Make sure your fingers are well away from the nipple and baby's mouth.   Stroke the baby's lips gently with your finger or nipple.   When the baby's mouth is open wide enough, place all of your nipple and as much of the areola as possible into your baby's mouth.   Pull the baby in close so the tip of the nose and the baby's cheeks touch the breast during the feeding.  FEEDINGS AND SUCTION  The length of each feeding varies from baby to baby and from feeding to feeding.   The baby must suck about 2 3 minutes for your milk to get to him or her. This is called a "let down." For this reason, allow the baby to feed on each breast as long as he or she wants. Your baby  will end the feeding when he or she has received the right balance of nutrients.   To break the suction, put your finger into the corner of the baby's mouth and slide it between his or her gums before removing your breast from his or her mouth. This will help prevent sore nipples.  HOW TO TELL WHETHER YOUR BABY IS GETTING ENOUGH BREAST MILK. Wondering whether or not your baby is getting enough milk is a common concern among mothers. You can be assured that your baby is getting enough milk if:   Your baby is actively sucking and you hear swallowing.   Your baby seems relaxed and satisfied after a feeding.   Your baby nurses at least 8 12 times in a 24 hour time period. Nurse your baby until he or she unlatches or falls asleep at the first breast (at least 10 20 minutes), then offer the second side.   Your baby is wetting 5 6 disposable diapers (6 8 cloth diapers) in a 24 hour period by 25 4 days of age.   Your baby is having at least 3 4 stools every 24 hours for the first 6 weeks. The stool should be soft and yellow.   Your baby should gain 4 7 ounces per week after he or she is 34 days old.   Your breasts feel softer after nursing.  REDUCING BREAST ENGORGEMENT  In the first week after your baby is born, you may experience signs of breast engorgement. When breasts  are engorged, they feel heavy, warm, full, and may be tender to the touch. You can reduce engorgement if you:   Nurse frequently, every 2 3 hours. Mothers who breastfeed early and often have fewer problems with engorgement.   Place light ice packs on your breasts for 10 20 minutes between feedings. This reduces swelling. Wrap the ice packs in a lightweight towel to protect your skin. Bags of frozen vegetables work well for this purpose.   Take a warm shower or apply warm, moist heat to your breast for 5 10 minutes just before each feeding. This increases circulation and helps the milk flow.   Gently massage your  breast before and during the feeding. Using your finger tips, massage from the chest wall towards your nipple in a circular motion.   Make sure that the baby empties at least one breast at every feeding before switching sides.   Use a breast pump to empty the breasts if your baby is sleepy or not nursing well. You may also want to pump if you are returning to work oryou feel you are getting engorged.   Avoid bottle feeds, pacifiers, or supplemental feedings of water or juice in place of breastfeeding. Breast milk is all the food your baby needs. It is not necessary for your baby to have water or formula. In fact, to help your breasts make more milk, it is best not to give your baby supplemental feedings during the early weeks.   Be sure the baby is latched on and positioned properly while breastfeeding.   Wear a supportive bra, avoiding underwire styles.   Eat a balanced diet with enough fluids.   Rest often, relax, and take your prenatal vitamins to prevent fatigue, stress, and anemia.  If you follow these suggestions, your engorgement should improve in 24 48 hours. If you are still experiencing difficulty, call your lactation consultant or caregiver.  CARING FOR YOURSELF Take care of your breasts  Bathe or shower daily.   Avoid using soap on your nipples.   Start feedings on your left breast at one feeding and on your right breast at the next feeding.   You will notice an increase in your milk supply 2 5 days after delivery. You may feel some discomfort from engorgement, which makes your breasts very firm and often tender. Engorgement "peaks" out within 24 48 hours. In the meantime, apply warm moist towels to your breasts for 5 10 minutes before feeding. Gentle massage and expression of some milk before feeding will soften your breasts, making it easier for your baby to latch on.   Wear a well-fitting nursing bra, and air dry your nipples for a 3 after each  feeding.   Only use cotton bra pads.   Only use pure lanolin on your nipples after nursing. You do not need to wash it off before feeding the baby again. Another option is to express a few drops of breast milk and gently massage it into your nipples.  Take care of yourself  Eat well-balanced meals and nutritious snacks.   Drinking milk, fruit juice, and water to satisfy your thirst (about 8 glasses a day).   Get plenty of rest.  Avoid foods that you notice affect the baby in a bad way.  SEEK MEDICAL CARE IF:   You have difficulty with breastfeeding and need help.   You have a hard, red, sore area on your breast that is accompanied by a fever.   Your baby is  too sleepy to eat well or is having trouble sleeping.   Your baby is wetting less than 6 diapers a day, by 14 days of age.   Your baby's skin or white part of his or her eyes is more yellow than it was in the hospital.   You feel depressed.  Document Released: 04/03/2005 Document Revised: 10/03/2011 Document Reviewed: 07/02/2011 [facility] Patient Information 2013 [place], [place]. Vaginal Birth After Cesarean Delivery Vaginal birth after Cesarean delivery (VBAC) is giving birth vaginally after previously delivering a baby by a cesarean. In the past, if a woman had a Cesarean delivery, all births afterwards would be done by Cesarean delivery. This is no longer true. It can be safe for the mother to try a vaginal delivery after having a Cesarean. The final decision to have a VBAC or repeat Cesarean delivery should be between the patient and her caregiver. The risks and benefits can be discussed relative to the reason for, and the type of the previous Cesarean delivery. WOMEN WHO PLAN TO HAVE A VBAC SHOULD CHECK WITH THEIR DOCTOR TO BE SURE THAT:  The previous Cesarean was done with a low transverse uterine incision (not a vertical classical incision).  The birth canal is big enough for the baby.  There were no other  operations on the uterus.  They will have an electronic fetal monitor (EFM) on at all times during labor.  An operating room would be available and ready in case an emergency Cesarean is needed.  A doctor and surgical nursing staff would be available at all times during labor to be ready to do an emergency Cesarean if necessary.  An anesthesiologist would be present in case an emergency Cesarean is needed.  The nursery is prepared and has adequate personnel and necessary equipment available to care for the baby in case of an emergency Cesarean. BENEFITS OF VBAC:  Shorter stay in the hospital.  Lower delivery, nursery and hospital costs.  Less blood loss and need for blood transfusions.  Less fever and discomfort from major surgery.  Lower risk of blood clots.  Lower risk of infection.  Shorter recovery after going home.  Lower risk of other surgical complications, such as opening of the incision or hernia in the incision.  Decreased risk of injury to other organs.  Decreased risk for having to remove the uterus (hysterectomy).  Decreased risk for the placenta to completely or partially cover the opening of the uterus (placenta previa) with a future pregnancy.  Ability to have a larger family if desired. RISKS OF A VBAC:  Rupture of the uterus.  Having to remove the uterus (hysterectomy) if it ruptures.  All the complications of major surgery and/or injury to other organs.  Excessive bleeding, blood clots and infection.  Lower Apgar scores (method to evaluate the newborn based on appearance, pulse, grimace, activity, and respiration) and more risks to the baby.  There is a higher risk of uterine rupture if you induce or augment labor.  There is a higher risk of uterine rupture if you use medications to ripen the cervix. VBAC SHOULD NOT BE DONE IF:  The previous Cesarean was done with a vertical (classical) or T-shaped incision, or you do not know what kind of an  incision was made.  You had a ruptured uterus.  You had surgery on your uterus.  You have medical or obstetrical problems.  There are problems with the baby.  There were two previous Cesarean deliveries and no vaginal deliveries. OTHER FACTS  TO KNOW ABOUT VBAC:  It is safe to have an epidural anesthetic with VBAC.  It is safe to turn the baby from a breech position (attempt an external cephalic version).  It is safe to try a VBAC with twins.  Pregnancies later than 40 weeks have not been successful with VBAC.  There is an increased failure rate of a VBAC in obese pregnant women.  There is an increased failure rate with VABC if the baby weighs 8.8 pounds (4000 grams) or more.  There is an increased failure rate if the time between the Cesarean and VBAC is less than 19 months.  There is an increased failure rate if pre-eclampsia is present (high blood pressure, protein in the urine and swelling of face and extremities).  VBAC is very successful if there was a previous vaginal birth.  VBAC is very successful when the labor starts spontaneously before the due date.  Delivery of VBAC is similar to having a normal spontaneous vaginal delivery. It is important to discuss VBAC with your caregiver early in the pregnancy so you can understand the risks, benefits and options. It will give you time to decide what is best in your particular case relevant to the reason for your previous Cesarean delivery. It should be understood that medical changes in the mother or pregnancy may occur during the pregnancy, which make it necessary to change you or your caregiver's initial decision. The counseling, concerns and decisions should be documented in the medical record and signed by all parties. Document Released: 09/24/2006 Document Revised: 06/26/2011 Document Reviewed: 05/15/2008 [facility] Patient Information 2013 [place], [place].

## 2012-04-08 NOTE — Progress Notes (Signed)
Pulse- 95 New ob packet given Weight gain of 11-20lbs

## 2012-04-08 NOTE — Progress Notes (Signed)
   Subjective:    Holly [name] is a G3P1011 [redacted]w[redacted]d being seen today for her first obstetrical visit.  Her obstetrical history is significant for obesity, pregnancy induced hypertension and low fluid and previous C-Section for low fluid. Patient does intend to breast feed. Pregnancy history fully reviewed.  Admitted with small amount of bleeding, none since and has resolved.  Patient reports no complaints.  Filed Vitals:   04/08/12 1058  BP: 130/85  Temp: 97 F (36.1 C)  Weight: 275 lb (124.739 kg)    HISTORY: OB History    Grav Para Term Preterm Abortions TAB SAB Ect Mult Living   3 1 1  1  1   1      # Outc Date GA Lbr Len/2nd Wgt Sex Del Anes PTL Lv   1 TRM 12/07 [redacted]w[redacted]d  4lb5oz(1.956kg) F LTCS EPI  Yes   Comments: PIH, loosing fluid   2 SAB            3 CUR              Past Medical History  Diagnosis Date  . Pregnancy   . Pregnancy induced hypertension    Past Surgical History  Procedure Date  . No past surgeries   . C-sectionx1   . Cesarean section    Family History  Problem Relation Age of Onset  . Colon cancer Neg Hx   . Esophageal cancer Neg Hx   . Stomach cancer Neg Hx   . Lung cancer Paternal Grandmother   . Diabetes Maternal Grandfather   . Kidney disease Paternal Uncle      Exam    Uterus:     Pelvic Exam:    Perineum: Normal Perineum   Vulva: normal   Vagina:  normal mucosa       Cervix: no lesions and nulliparous appearance   Adnexa: no mass, fullness, tenderness   Bony Pelvis: average  System:     Skin: normal coloration and turgor, no rashes    Neurologic: oriented   Extremities: normal strength, tone, and muscle mass   HEENT sclera clear, anicteric   Mouth/Teeth mucous membranes moist, pharynx normal without lesions   Neck supple   Cardiovascular: regular rate and rhythm   Respiratory:  appears well, vitals normal, no respiratory distress, acyanotic, normal RR, ear and throat exam is normal, neck free of mass or lymphadenopathy,  chest clear, no wheezing, crepitations, rhonchi, normal symmetric air entry   Abdomen: soft, non-tender; bowel sounds normal; no masses,  no organomegaly      Assessment:    Pregnancy: [id number] Patient Active Problem List  Diagnosis  . Rectal bleeding  . Abnormal finding on GI tract imaging  . Pregnancy with third trimester bleeding, antepartum  . Unspecified high-risk pregnancy        Plan:     Initial labs drawn. Prenatal vitamins. Problem list reviewed and updated. Genetic Screening discussed Quad Screen: too late.  Ultrasound discussed; fetal survey: results reviewed. After lengthy discussion, pt. Elects for TOLAC--needs to sign VBAC consent--info given today.  Follow up in 2 weeks.   Holly [name] S 04/08/2012

## 2012-04-08 NOTE — Progress Notes (Signed)
Nutrition note: 1st visit consult Pt has gained 35# @ [redacted]w[redacted]d, which is > expected. Pt reports eating 3 meals & 2 snacks/d. Pt reports drinking water, milk & juice. Pt reports taking PNV. Pt reports no N&V but has heartburn. NKFA. Pt reports she walks some days.  Pt received verbal & written education on general nutrition during pregnancy & tips to decrease heartburn.  Disc wt gain goals of 11-20# or 0.5#/wk & encouraged exercise & diet along with BF after pregnancy to lose wt. Pt agrees to continue taking PNV & monitor her portion sizes. Pt has WIC & plans to BF. F/u if referred Blondell Reveal, MS, RD, LDN

## 2012-04-09 LAB — CULTURE, OB URINE
Colony Count: NO GROWTH
Organism ID, Bacteria: NO GROWTH

## 2012-04-14 ENCOUNTER — Inpatient Hospital Stay (HOSPITAL_COMMUNITY)
Admission: AD | Admit: 2012-04-14 | Discharge: 2012-04-14 | Disposition: A | Discharge status: Home / Self Care | Payer: Medicaid Other | Source: Ambulatory Visit | Attending: Family Medicine | Admitting: Family Medicine

## 2012-04-14 ENCOUNTER — Encounter (HOSPITAL_COMMUNITY): Payer: Self-pay | Admitting: Family

## 2012-04-14 DIAGNOSIS — R109 Unspecified abdominal pain: Secondary | ICD-10-CM | POA: Insufficient documentation

## 2012-04-14 DIAGNOSIS — O0990 Supervision of high risk pregnancy, unspecified, unspecified trimester: Secondary | ICD-10-CM

## 2012-04-14 DIAGNOSIS — O34219 Maternal care for unspecified type scar from previous cesarean delivery: Secondary | ICD-10-CM

## 2012-04-14 DIAGNOSIS — O99891 Other specified diseases and conditions complicating pregnancy: Secondary | ICD-10-CM

## 2012-04-14 LAB — CBC
HCT: 31.1 % — ABNORMAL LOW (ref 36.0–46.0)
MCV: 85.4 fL (ref 78.0–100.0)
RDW: 16.9 % — ABNORMAL HIGH (ref 11.5–15.5)
WBC: 7.9 10*3/uL (ref 4.0–10.5)

## 2012-04-14 LAB — URINALYSIS, ROUTINE W REFLEX MICROSCOPIC
Protein, ur: NEGATIVE mg/dL
Urobilinogen, UA: 0.2 mg/dL (ref 0.0–1.0)

## 2012-04-14 LAB — COMPREHENSIVE METABOLIC PANEL
Albumin: 2.7 g/dL — ABNORMAL LOW (ref 3.5–5.2)
BUN: 7 mg/dL (ref 6–23)
Chloride: 101 mEq/L (ref 96–112)
Creatinine, Ser: 0.49 mg/dL — ABNORMAL LOW (ref 0.50–1.10)
GFR calc non Af Amer: >90 mL/min (ref >90)
Total Bilirubin: 0.3 mg/dL (ref 0.3–1.2)

## 2012-04-14 LAB — PROTEIN / CREATININE RATIO, URINE
Creatinine, Urine: 252.98 mg/dL
Total Protein, Urine: 44.6 mg/dL

## 2012-04-14 LAB — URINE MICROSCOPIC-ADD ON

## 2012-04-14 MED ORDER — ACETAMINOPHEN 500 MG PO TABS
1000.0000 mg | ORAL_TABLET | Freq: Once | ORAL | Status: AC
  Administered 2012-04-14: 1000 mg via ORAL
  Filled 2012-04-14: qty 2

## 2012-04-14 MED ORDER — GI COCKTAIL ~~LOC~~
30.0000 mL | Freq: Once | ORAL | Status: AC
  Administered 2012-04-14: 30 mL via ORAL
  Filled 2012-04-14: qty 30

## 2012-04-14 NOTE — MAU Provider Note (Signed)
Chief Complaint:  Abdominal Pain   First Provider Initiated Contact with Patient 04/14/12 1129      HPI: Holly [name] is a 31 y.o. G3P1011 at [redacted]w[redacted]d who presents to maternity admissions reporting abdominal pain. The pain has been present for about 1 week but the patient reports that it is worse this morning. She rates it at a 9/10 today. The patient states that the pain is mid and left upper abdomen predominantly, but also occasional in the lower abdomen. She denies N/V/D.   Denies contractions, leakage of fluid or vaginal bleeding. Good fetal movement.   Pregnancy Course:  Late prenatal care Admission for vaginal bleeding 03/26/12 First prenatal care visit in clinic on 04/08/12  Past Medical History: Past Medical History  Diagnosis Date  . Pregnancy   . Pregnancy induced hypertension     Past obstetric history: OB History    Grav Para Term Preterm Abortions TAB SAB Ect Mult Living   3 1 1  1  1   1      # Outc Date GA Lbr Len/2nd Wgt Sex Del Anes PTL Lv   1 TRM 12/07 [redacted]w[redacted]d  4lb5oz(1.956kg) F LTCS EPI  Yes   Comments: PIH, losing fluid   2 SAB            3 CUR               Past Surgical History: Past Surgical History  Procedure Date  . No past surgeries   . C-sectionx1   . Cesarean section     Family History: Family History  Problem Relation Age of Onset  . Colon cancer Neg Hx   . Esophageal cancer Neg Hx   . Stomach cancer Neg Hx   . Lung cancer Paternal Grandmother   . Diabetes Maternal Grandfather   . Kidney disease Paternal Uncle     Social History: History  Substance Use Topics  . Smoking status: Never Smoker   . Smokeless tobacco: Never Used  . Alcohol Use: No    Allergies:  Allergies  Allergen Reactions  . Naproxen Sodium     Chest burns     Meds:  No prescriptions prior to admission    ROS: Pertinent findings in history of present illness.  Physical Exam  Blood pressure 107/65, pulse 89, temperature 98.1 F (36.7 C), temperature  source Oral, resp. rate 18, height 5' 3.5" (1.613 m), weight 278 lb 3.2 oz (126.191 kg). GENERAL: Well-developed, obese female in no acute distress.  HEENT: normocephalic HEART: normal rate, rhythm.  RESP: normal effort. Lungs clear to ascultation ABDOMEN: Soft, mild tenderness to palpation of the LUQ and epigastric area, exam limited by body habitus EXTREMITIES: Nontender, no edema NEURO: alert and oriented    FHT:  Baseline 135 , moderate variability, accelerations present, no decelerations Contractions: None   Labs: Results for orders placed during the hospital encounter of 04/14/12 (from the past 24 hour(s))  URINALYSIS, ROUTINE W REFLEX MICROSCOPIC     Status: Abnormal   Collection Time   04/14/12 10:55 AM      Component Value Range   Color, Urine YELLOW  YELLOW   APPearance CLOUDY (*) CLEAR   Specific Gravity, Urine >1.030 (*) 1.005 - 1.030   pH 6.0  5.0 - 8.0   Glucose, UA NEGATIVE  NEGATIVE mg/dL   Hgb urine dipstick NEGATIVE  NEGATIVE   Bilirubin Urine NEGATIVE  NEGATIVE   Ketones, ur 15 (*) NEGATIVE mg/dL   Protein, ur NEGATIVE  NEGATIVE  mg/dL   Urobilinogen, UA 0.2  0.0 - 1.0 mg/dL   Nitrite NEGATIVE  NEGATIVE   Leukocytes, UA SMALL (*) NEGATIVE  URINE MICROSCOPIC-ADD ON     Status: Abnormal   Collection Time   04/14/12 10:55 AM      Component Value Range   Squamous Epithelial / LPF MANY (*) RARE   WBC, UA 3-6  <3 WBC/hpf   Bacteria, UA MANY (*) RARE   Urine-Other MUCOUS PRESENT    CBC     Status: Abnormal   Collection Time   04/14/12  1:26 PM      Component Value Range   WBC 7.9  4.0 - 10.5 K/uL   RBC 3.64 (*) 3.87 - 5.11 MIL/uL   Hemoglobin 9.9 (*) 12.0 - 15.0 g/dL   HCT 16.1 (*) 09.6 - 04.5 %   MCV 85.4  78.0 - 100.0 fL   MCH 27.2  26.0 - 34.0 pg   MCHC 31.8  30.0 - 36.0 g/dL   RDW 40.9 (*) 81.1 - 91.4 %   Platelets 327  150 - 400 K/uL  COMPREHENSIVE METABOLIC PANEL     Status: Abnormal   Collection Time   04/14/12  1:26 PM      Component Value  Range   Sodium 132 (*) 135 - 145 mEq/L   Potassium 3.7  3.5 - 5.1 mEq/L   Chloride 101  96 - 112 mEq/L   CO2 21  19 - 32 mEq/L   Glucose, Bld 135 (*) 70 - 99 mg/dL   BUN 7  6 - 23 mg/dL   Creatinine, Ser 7.82 (*) 0.50 - 1.10 mg/dL   Calcium 9.0  8.4 - 95.6 mg/dL   Total Protein 6.6  6.0 - 8.3 g/dL   Albumin 2.7 (*) 3.5 - 5.2 g/dL   AST 18  0 - 37 U/L   ALT 12  0 - 35 U/L   Alkaline Phosphatase 79  39 - 117 U/L   Total Bilirubin 0.3  0.3 - 1.2 mg/dL   GFR calc non Af Amer >90  >90 mL/min   GFR calc Af Amer >90  >90 mL/min    Imaging:  None  MAU Course: Patient monitored x 47 minutes. Results noted above.  GI cocktail given @ 1200 - little relief Tylenol give - some relief Consulted with Sharen Counter; suggested CBC, CMP to rule out liver etiology or pre-eclampsia BP good today. CBC, CMP - WNL  Assessment: 1. Unspecified high-risk pregnancy   2. Previous cesarean delivery, antepartum condition or complication   3. Round ligament pain  Plan: Discharge home Patient encourage to use tylenol for pain Discussed pre-eclampsia precautions and when to return to MAU if needed Patient will keep appointment with the clinic for prenatal care as scheduled.      Follow-up Information    Follow up with [facility]. (As scheduled)    Contact information:   [address] [place] [address] [phone]          Medication List     As of 04/14/2012  2:54 PM    TAKE these medications         prenatal multivitamin Tabs   Take 1 tablet by mouth daily.          Freddi Starr, PA-C 04/14/2012 2:54 PM

## 2012-04-14 NOTE — MAU Note (Signed)
Pt reports waking up with pain in her upper and lower abd that comes and goes.  Good fetal movement reported. Denies any vaginal bleeding or discharge

## 2012-04-14 NOTE — MAU Note (Signed)
Patient presents to MAU with c/o sharp, intermittent upper to mid abdominal pain; pain 9.5/10 awakened from sleep this morning with it. Denies n/v/d. LBM today; reports normal. Denies vaginal bleeding/discharge.  Was admitted for vaginal bleeding earlier this month. Late to [facility].

## 2012-04-15 ENCOUNTER — Ambulatory Visit (INDEPENDENT_AMBULATORY_CARE_PROVIDER_SITE_OTHER): Payer: Medicaid Other | Admitting: Obstetrics and Gynecology

## 2012-04-15 VITALS — BP 116/76 | Temp 97.9°F | Wt 278.9 lb

## 2012-04-15 DIAGNOSIS — N390 Urinary tract infection, site not specified: Secondary | ICD-10-CM | POA: Insufficient documentation

## 2012-04-15 DIAGNOSIS — O34219 Maternal care for unspecified type scar from previous cesarean delivery: Secondary | ICD-10-CM

## 2012-04-15 DIAGNOSIS — O0990 Supervision of high risk pregnancy, unspecified, unspecified trimester: Secondary | ICD-10-CM

## 2012-04-15 DIAGNOSIS — E669 Obesity, unspecified: Secondary | ICD-10-CM | POA: Insufficient documentation

## 2012-04-15 LAB — URINE CULTURE: Colony Count: >=100000

## 2012-04-15 LAB — POCT URINALYSIS DIP (DEVICE)
Bilirubin Urine: NEGATIVE
Nitrite: NEGATIVE
Protein, ur: 30 mg/dL — AB
Urobilinogen, UA: 0.2 mg/dL (ref 0.0–1.0)
pH: 6 (ref 5.0–8.0)

## 2012-04-15 MED ORDER — RANITIDINE HCL 150 MG PO TABS: 150.0000 mg | ORAL_TABLET | Freq: Two times a day (BID) | ORAL | Status: DC

## 2012-04-15 NOTE — Progress Notes (Signed)
p=[phone] 

## 2012-04-15 NOTE — Progress Notes (Signed)
Doing well. Late to care. UDS was neg. No further spotting.  Tx'd Bactrim, urine C&S was neg.  Wants TOLAC, states did not get consent form yet.> given and reviewed.  Plans Mirena.  BP recheck 116/76

## 2012-04-15 NOTE — Addendum Note (Signed)
Addended by: Caren Griffins C on: 04/15/2012 10:48 AM   Modules accepted: Orders

## 2012-04-15 NOTE — Progress Notes (Signed)
Pulse- 99  Pain/pressure- lower abd Went MAU 04/14/12 for migraines pressure at [facility]

## 2012-04-15 NOTE — Patient Instructions (Signed)
Pregnancy - Third Trimester  The third trimester of pregnancy (the last 3 months) is a period of the most rapid growth for you and your baby. The baby approaches a length of 20 inches and a weight of 6 to 10 pounds. The baby is adding on fat and getting ready for life outside your body. While inside, babies have periods of sleeping and waking, suck their thumbs, and hiccups. You can often feel small contractions of the uterus. This is false labor. It is also called Braxton-Hicks contractions. This is like a practice for labor. The usual problems in this stage of pregnancy include more difficulty breathing, swelling of the hands and feet from water retention, and having to urinate more often because of the uterus and baby pressing on your bladder.   PRENATAL EXAMS  · Blood work may continue to be done during prenatal exams. These tests are done to check on your health and the probable health of your baby. Blood work is used to follow your blood levels (hemoglobin). Anemia (low hemoglobin) is common during pregnancy. Iron and vitamins are given to help prevent this. You may also continue to be checked for diabetes. Some of the past blood tests may be done again.  · The size of the uterus is measured during each visit. This makes sure your baby is growing properly according to your pregnancy dates.  · Your blood pressure is checked every prenatal visit. This is to make sure you are not getting toxemia.  · Your urine is checked every prenatal visit for infection, diabetes and protein.  · Your weight is checked at each visit. This is done to make sure gains are happening at the suggested rate and that you and your baby are growing normally.  · Sometimes, an ultrasound is performed to confirm the position and the proper growth and development of the baby. This is a test done that bounces harmless sound waves off the baby so your caregiver can more accurately determine due dates.  · Discuss the type of pain medication and  anesthesia you will have during your labor and delivery.  · Discuss the possibility and anesthesia if a Cesarean Section might be necessary.  · Inform your caregiver if there is any mental or physical violence at home.  Sometimes, a specialized non-stress test, contraction stress test and biophysical profile are done to make sure the baby is not having a problem. Checking the amniotic fluid surrounding the baby is called an amniocentesis. The amniotic fluid is removed by sticking a needle into the belly (abdomen). This is sometimes done near the end of pregnancy if an early delivery is required. In this case, it is done to help make sure the baby's lungs are mature enough for the baby to live outside of the womb. If the lungs are not mature and it is unsafe to deliver the baby, an injection of cortisone medication is given to the mother 1 to 2 days before the delivery. This helps the baby's lungs mature and makes it safer to deliver the baby.  CHANGES OCCURING IN THE THIRD TRIMESTER OF PREGNANCY  Your body goes through many changes during pregnancy. They vary from person to person. Talk to your caregiver about changes you notice and are concerned about.  · During the last trimester, you have probably had an increase in your appetite. It is normal to have cravings for certain foods. This varies from person to person and pregnancy to pregnancy.  · You may begin to   get stretch marks on your hips, abdomen, and breasts. These are normal changes in the body during pregnancy. There are no exercises or medications to take which prevent this change.  · Constipation may be treated with a stool softener or adding bulk to your diet. Drinking lots of fluids, fiber in vegetables, fruits, and whole grains are helpful.  · Exercising is also helpful. If you have been very active up until your pregnancy, most of these activities can be continued during your pregnancy. If you have been less active, it is helpful to start an exercise  program such as walking. Consult your caregiver before starting exercise programs.  · Avoid all smoking, alcohol, un-prescribed drugs, herbs and "street drugs" during your pregnancy. These chemicals affect the formation and growth of the baby. Avoid chemicals throughout the pregnancy to ensure the delivery of a healthy infant.  · Backache, varicose veins and hemorrhoids may develop or get worse.  · You will tire more easily in the third trimester, which is normal.  · The baby's movements may be stronger and more often.  · You may become short of breath easily.  · Your belly button may stick out.  · A yellow discharge may leak from your breasts called colostrum.  · You may have a bloody mucus discharge. This usually occurs a few days to a week before labor begins.  HOME CARE INSTRUCTIONS   · Keep your caregiver's appointments. Follow your caregiver's instructions regarding medication use, exercise, and diet.  · During pregnancy, you are providing food for you and your baby. Continue to eat regular, well-balanced meals. Choose foods such as meat, fish, milk and other low fat dairy products, vegetables, fruits, and whole-grain breads and cereals. Your caregiver will tell you of the ideal weight gain.  · A physical sexual relationship may be continued throughout pregnancy if there are no other problems such as early (premature) leaking of amniotic fluid from the membranes, vaginal bleeding, or belly (abdominal) pain.  · Exercise regularly if there are no restrictions. Check with your caregiver if you are unsure of the safety of your exercises. Greater weight gain will occur in the last 2 trimesters of pregnancy. Exercising helps:  · Control your weight.  · Get you in shape for labor and delivery.  · You lose weight after you deliver.  · Rest a lot with legs elevated, or as needed for leg cramps or low back pain.  · Wear a good support or jogging bra for breast tenderness during pregnancy. This may help if worn during  sleep. Pads or tissues may be used in the bra if you are leaking colostrum.  · Do not use hot tubs, steam rooms, or saunas.  · Wear your seat belt when driving. This protects you and your baby if you are in an accident.  · Avoid raw meat, cat litter boxes and soil used by cats. These carry germs that can cause birth defects in the baby.  · It is easier to loose urine during pregnancy. Tightening up and strengthening the pelvic muscles will help with this problem. You can practice stopping your urination while you are going to the bathroom. These are the same muscles you need to strengthen. It is also the muscles you would use if you were trying to stop from passing gas. You can practice tightening these muscles up 10 times a set and repeating this about 3 times per day. Once you know what muscles to tighten up, do not perform these   exercises during urination. It is more likely to cause an infection by backing up the urine.  · Ask for help if you have financial, counseling or nutritional needs during pregnancy. Your caregiver will be able to offer counseling for these needs as well as refer you for other special needs.  · Make a list of emergency phone numbers and have them available.  · Plan on getting help from family or friends when you go home from the hospital.  · Make a trial run to the hospital.  · Take prenatal classes with the father to understand, practice and ask questions about the labor and delivery.  · Prepare the baby's room/nursery.  · Do not travel out of the city unless it is absolutely necessary and with the advice of your caregiver.  · Wear only low or no heal shoes to have better balance and prevent falling.  MEDICATIONS AND DRUG USE IN PREGNANCY  · Take prenatal vitamins as directed. The vitamin should contain 1 milligram of folic acid. Keep all vitamins out of reach of children. Only a couple vitamins or tablets containing iron may be fatal to a baby or young child when ingested.  · Avoid use  of all medications, including herbs, over-the-counter medications, not prescribed or suggested by your caregiver. Only take over-the-counter or prescription medicines for pain, discomfort, or fever as directed by your caregiver. Do not use aspirin, ibuprofen (Motrin®, Advil®, Nuprin®) or naproxen (Aleve®) unless OK'd by your caregiver.  · Let your caregiver also know about herbs you may be using.  · Alcohol is related to a number of birth defects. This includes fetal alcohol syndrome. All alcohol, in any form, should be avoided completely. Smoking will cause low birth rate and premature babies.  · Street/illegal drugs are very harmful to the baby. They are absolutely forbidden. A baby born to an addicted mother will be addicted at birth. The baby will go through the same withdrawal an adult does.  SEEK MEDICAL CARE IF:  You have any concerns or worries during your pregnancy. It is better to call with your questions if you feel they cannot wait, rather than worry about them.  DECISIONS ABOUT CIRCUMCISION  You may or may not know the sex of your baby. If you know your baby is a boy, it may be time to think about circumcision. Circumcision is the removal of the foreskin of the penis. This is the skin that covers the sensitive end of the penis. There is no proven medical need for this. Often this decision is made on what is popular at the time or based upon religious beliefs and social issues. You can discuss these issues with your caregiver or pediatrician.  SEEK IMMEDIATE MEDICAL CARE IF:   · An unexplained oral temperature above 102° F (38.9° C) develops, or as your caregiver suggests.  · You have leaking of fluid from the vagina (birth canal). If leaking membranes are suspected, take your temperature and tell your caregiver of this when you call.  · There is vaginal spotting, bleeding or passing clots. Tell your caregiver of the amount and how many pads are used.  · You develop a bad smelling vaginal discharge with  a change in the color from clear to white.  · You develop vomiting that lasts more than 24 hours.  · You develop chills or fever.  · You develop shortness of breath.  · You develop burning on urination.  · You loose more than 2 pounds of weight   or gain more than 2 pounds of weight or as suggested by your caregiver.  · You notice sudden swelling of your face, hands, and feet or legs.  · You develop belly (abdominal) pain. Round ligament discomfort is a common non-cancerous (benign) cause of abdominal pain in pregnancy. Your caregiver still must evaluate you.  · You develop a severe headache that does not go away.  · You develop visual problems, blurred or double vision.  · If you have not felt your baby move for more than 1 hour. If you think the baby is not moving as much as usual, eat something with sugar in it and lie down on your left side for an hour. The baby should move at least 4 to 5 times per hour. Call right away if your baby moves less than that.  · You fall, are in a car accident or any kind of trauma.  · There is mental or physical violence at home.  Document Released: 03/28/2001 Document Revised: 06/26/2011 Document Reviewed: 09/30/2008  [employer]® Patient Information ©2013 [employer].

## 2012-04-15 NOTE — Progress Notes (Signed)
RX Zantac for GERD. OTC antacids not working

## 2012-04-17 NOTE — L&D Delivery Note (Signed)
Holly [name] is a 32 y.o. G3P1011 with prior c-section presenting at [redacted]w[redacted]d for induction of labor for oligohydramnios. Patient desired TOLAC and consent was signed. She underwent cervical ripening with foley bulb followed by pitocin. She progressed appropriately to complete dilation.  Delivery Note At 12:09 PM a viable female was delivered via Vaginal, Spontaneous Delivery (Presentation: vertex; Occiput Anterior).  APGAR: 9, 9; weight pending.   Placenta status: Intact, Spontaneous.  Cord: 3 vessels with the following complications: Short.  Cord pH: n/a  Anesthesia: Epidural  Episiotomy: None Lacerations: bilateral periurethral, left vaginal wall Suture Repair: 3.0 vicryl Est. Blood Loss (mL): 350  Mom to postpartum.  Baby to nursery-stable.  Napoleon Form 05/28/2012, 12:41 PM

## 2012-04-17 NOTE — L&D Delivery Note (Signed)
Attestation of Attending Supervision of Obstetric Fellow: Evaluation and management procedures were performed by the Obstetric Fellow under my supervision and collaboration.  I have reviewed the Obstetric Fellow's note and chart, and I agree with the management and plan.  Ronrico Dupin, MD, FACOG Attending Obstetrician & Gynecologist Faculty Practice, [facility]   

## 2012-04-23 ENCOUNTER — Encounter: Payer: Self-pay | Admitting: *Deleted

## 2012-04-29 ENCOUNTER — Ambulatory Visit (INDEPENDENT_AMBULATORY_CARE_PROVIDER_SITE_OTHER): Payer: Medicaid Other | Admitting: Family Medicine

## 2012-04-29 VITALS — BP 137/87 | Temp 98.2°F | Wt 280.8 lb

## 2012-04-29 DIAGNOSIS — O228X9 Other venous complications in pregnancy, unspecified trimester: Secondary | ICD-10-CM

## 2012-04-29 DIAGNOSIS — O99891 Other specified diseases and conditions complicating pregnancy: Secondary | ICD-10-CM

## 2012-04-29 DIAGNOSIS — O99619 Diseases of the digestive system complicating pregnancy, unspecified trimester: Secondary | ICD-10-CM

## 2012-04-29 DIAGNOSIS — K649 Unspecified hemorrhoids: Secondary | ICD-10-CM

## 2012-04-29 DIAGNOSIS — O2240 Hemorrhoids in pregnancy, unspecified trimester: Secondary | ICD-10-CM

## 2012-04-29 DIAGNOSIS — K5900 Constipation, unspecified: Secondary | ICD-10-CM

## 2012-04-29 MED ORDER — LIDOCAINE 5 % EX OINT: TOPICAL_OINTMENT | CUTANEOUS | Status: DC | PRN

## 2012-04-29 MED ORDER — POLYETHYLENE GLYCOL 3350 17 GM/SCOOP PO POWD: 17.0000 g | Freq: Three times a day (TID) | ORAL | Status: DC | PRN

## 2012-04-29 NOTE — Progress Notes (Signed)
Rectal bleeding, hemorrhoids, very painful. Using sitz baths, hemorrhoid cream, colace. Stools still hard and q 4 days. Rx MiraLax, pt instructed to use 2 to 3 times today then once or twice daily to keep stools regular/loose. Lidocaine ointment to help with pain. Describes occasional pinkish vaginal discharge, no itching or discomfort, not watery. Declines pelvic/rectal exam.

## 2012-04-29 NOTE — Patient Instructions (Signed)
Take Miralax with 8 oz water or juice. Use 2 or 3 times today and until you have a normal bowel movement, then once or twice daily to keep stools regular and loose.  Constipation, Adult Constipation is when a person has fewer than 3 bowel movements a week; has difficulty having a bowel movement; or has stools that are dry, hard, or larger than normal. As people grow older, constipation is more common. If you try to fix constipation with medicines that make you have a bowel movement (laxatives), the problem may get worse. Long-term laxative use may cause the muscles of the colon to become weak. A low-fiber diet, not taking in enough fluids, and taking certain medicines may make constipation worse. CAUSES   Certain medicines, such as antidepressants, pain medicine, iron supplements, antacids, and water pills.   Certain diseases, such as diabetes, irritable bowel syndrome (IBS), thyroid disease, or depression.   Not drinking enough water.   Not eating enough fiber-rich foods.   Stress or travel.  Lack of physical activity or exercise.  Not going to the restroom when there is the urge to have a bowel movement.  Ignoring the urge to have a bowel movement.  Using laxatives too much. SYMPTOMS   Having fewer than 3 bowel movements a week.   Straining to have a bowel movement.   Having hard, dry, or larger than normal stools.   Feeling full or bloated.   Pain in the lower abdomen.  Not feeling relief after having a bowel movement. DIAGNOSIS  Your caregiver will take a medical history and perform a physical exam. Further testing may be done for severe constipation. Some tests may include:   A barium enema X-ray to examine your rectum, colon, and sometimes, your small intestine.  A sigmoidoscopy to examine your lower colon.  A colonoscopy to examine your entire colon. TREATMENT  Treatment will depend on the severity of your constipation and what is causing it. Some dietary  treatments include drinking more fluids and eating more fiber-rich foods. Lifestyle treatments may include regular exercise. If these diet and lifestyle recommendations do not help, your caregiver may recommend taking over-the-counter laxative medicines to help you have bowel movements. Prescription medicines may be prescribed if over-the-counter medicines do not work.  HOME CARE INSTRUCTIONS   Increase dietary fiber in your diet, such as fruits, vegetables, whole grains, and beans. Limit high-fat and processed sugars in your diet, such as [place] fries, hamburgers, cookies, candies, and soda.   A fiber supplement may be added to your diet if you cannot get enough fiber from foods.   Drink enough fluids to keep your urine clear or pale yellow.   Exercise regularly or as directed by your caregiver.   Go to the restroom when you have the urge to go. Do not hold it.  Only take medicines as directed by your caregiver. Do not take other medicines for constipation without talking to your caregiver first. SEEK IMMEDIATE MEDICAL CARE IF:   You have bright red blood in your stool.   Your constipation lasts for more than 4 days or gets worse.   You have abdominal or rectal pain.   You have thin, pencil-like stools.  You have unexplained weight loss. MAKE SURE YOU:   Understand these instructions.  Will watch your condition.  Will get help right away if you are not doing well or get worse. Document Released: 12/31/2003 Document Revised: 06/26/2011 Document Reviewed: 03/07/2011 [facility] Patient Information 2013 [place], [place].  Pregnancy -  Third Trimester The third trimester of pregnancy (the last 3 months) is a period of the most rapid growth for you and your baby. The baby approaches a length of 20 inches and a weight of 6 to 10 pounds. The baby is adding on fat and getting ready for life outside your body. While inside, babies have periods of sleeping and waking, suck their thumbs,  and hiccups. You can often feel small contractions of the uterus. This is false labor. It is also called Braxton-Hicks contractions. This is like a practice for labor. The usual problems in this stage of pregnancy include more difficulty breathing, swelling of the hands and feet from water retention, and having to urinate more often because of the uterus and baby pressing on your bladder.  PRENATAL EXAMS  Blood work may continue to be done during prenatal exams. These tests are done to check on your health and the probable health of your baby. Blood work is used to follow your blood levels (hemoglobin). Anemia (low hemoglobin) is common during pregnancy. Iron and vitamins are given to help prevent this. You may also continue to be checked for diabetes. Some of the past blood tests may be done again.  The size of the uterus is measured during each visit. This makes sure your baby is growing properly according to your pregnancy dates.  Your blood pressure is checked every prenatal visit. This is to make sure you are not getting toxemia.  Your urine is checked every prenatal visit for infection, diabetes and protein.  Your weight is checked at each visit. This is done to make sure gains are happening at the suggested rate and that you and your baby are growing normally.  Sometimes, an ultrasound is performed to confirm the position and the proper growth and development of the baby. This is a test done that bounces harmless sound waves off the baby so your caregiver can more accurately determine due dates.  Discuss the type of pain medication and anesthesia you will have during your labor and delivery.  Discuss the possibility and anesthesia if a Cesarean Section might be necessary.  Inform your caregiver if there is any mental or physical violence at home. Sometimes, a specialized non-stress test, contraction stress test and biophysical profile are done to make sure the baby is not having a problem.  Checking the amniotic fluid surrounding the baby is called an amniocentesis. The amniotic fluid is removed by sticking a needle into the belly (abdomen). This is sometimes done near the end of pregnancy if an early delivery is required. In this case, it is done to help make sure the baby's lungs are mature enough for the baby to live outside of the womb. If the lungs are not mature and it is unsafe to deliver the baby, an injection of cortisone medication is given to the mother 1 to 2 days before the delivery. This helps the baby's lungs mature and makes it safer to deliver the baby. CHANGES OCCURING IN THE THIRD TRIMESTER OF PREGNANCY Your body goes through many changes during pregnancy. They vary from person to person. Talk to your caregiver about changes you notice and are concerned about.  During the last trimester, you have probably had an increase in your appetite. It is normal to have cravings for certain foods. This varies from person to person and pregnancy to pregnancy.  You may begin to get stretch marks on your hips, abdomen, and breasts. These are normal changes in the body during  pregnancy. There are no exercises or medications to take which prevent this change.  Constipation may be treated with a stool softener or adding bulk to your diet. Drinking lots of fluids, fiber in vegetables, fruits, and whole grains are helpful.  Exercising is also helpful. If you have been very active up until your pregnancy, most of these activities can be continued during your pregnancy. If you have been less active, it is helpful to start an exercise program such as walking. Consult your caregiver before starting exercise programs.  Avoid all smoking, alcohol, un-prescribed drugs, herbs and "street drugs" during your pregnancy. These chemicals affect the formation and growth of the baby. Avoid chemicals throughout the pregnancy to ensure the delivery of a healthy infant.  Backache, varicose veins and  hemorrhoids may develop or get worse.  You will tire more easily in the third trimester, which is normal.  The baby's movements may be stronger and more often.  You may become short of breath easily.  Your belly button may stick out.  A yellow discharge may leak from your breasts called colostrum.  You may have a bloody mucus discharge. This usually occurs a few days to a week before labor begins. HOME CARE INSTRUCTIONS   Keep your caregiver's appointments. Follow your caregiver's instructions regarding medication use, exercise, and diet.  During pregnancy, you are providing food for you and your baby. Continue to eat regular, well-balanced meals. Choose foods such as meat, fish, milk and other low fat dairy products, vegetables, fruits, and whole-grain breads and cereals. Your caregiver will tell you of the ideal weight gain.  A physical sexual relationship may be continued throughout pregnancy if there are no other problems such as early (premature) leaking of amniotic fluid from the membranes, vaginal bleeding, or belly (abdominal) pain.  Exercise regularly if there are no restrictions. Check with your caregiver if you are unsure of the safety of your exercises. Greater weight gain will occur in the last 2 trimesters of pregnancy. Exercising helps:  Control your weight.  Get you in shape for labor and delivery.  You lose weight after you deliver.  Rest a lot with legs elevated, or as needed for leg cramps or low back pain.  Wear a good support or jogging bra for breast tenderness during pregnancy. This may help if worn during sleep. Pads or tissues may be used in the bra if you are leaking colostrum.  Do not use hot tubs, steam rooms, or saunas.  Wear your seat belt when driving. This protects you and your baby if you are in an accident.  Avoid raw meat, cat litter boxes and soil used by cats. These carry germs that can cause birth defects in the baby.  It is easier to loose  urine during pregnancy. Tightening up and strengthening the pelvic muscles will help with this problem. You can practice stopping your urination while you are going to the bathroom. These are the same muscles you need to strengthen. It is also the muscles you would use if you were trying to stop from passing gas. You can practice tightening these muscles up 10 times a set and repeating this about 3 times per day. Once you know what muscles to tighten up, do not perform these exercises during urination. It is more likely to cause an infection by backing up the urine.  Ask for help if you have financial, counseling or nutritional needs during pregnancy. Your caregiver will be able to offer counseling for these needs  as well as refer you for other special needs.  Make a list of emergency phone numbers and have them available.  Plan on getting help from family or friends when you go home from the hospital.  Make a trial run to the hospital.  Take prenatal classes with the father to understand, practice and ask questions about the labor and delivery.  Prepare the baby's room/nursery.  Do not travel out of the city unless it is absolutely necessary and with the advice of your caregiver.  Wear only low or no heal shoes to have better balance and prevent falling. MEDICATIONS AND DRUG USE IN PREGNANCY  Take prenatal vitamins as directed. The vitamin should contain 1 milligram of folic acid. Keep all vitamins out of reach of children. Only a couple vitamins or tablets containing iron may be fatal to a baby or young child when ingested.  Avoid use of all medications, including herbs, over-the-counter medications, not prescribed or suggested by your caregiver. Only take over-the-counter or prescription medicines for pain, discomfort, or fever as directed by your caregiver. Do not use aspirin, ibuprofen (Motrin, Advil, Nuprin) or naproxen (Aleve) unless OK'd by your caregiver.  Let your caregiver also  know about herbs you may be using.  Alcohol is related to a number of birth defects. This includes fetal alcohol syndrome. All alcohol, in any form, should be avoided completely. Smoking will cause low birth rate and premature babies.  Street/illegal drugs are very harmful to the baby. They are absolutely forbidden. A baby born to an addicted mother will be addicted at birth. The baby will go through the same withdrawal an adult does. SEEK MEDICAL CARE IF: You have any concerns or worries during your pregnancy. It is better to call with your questions if you feel they cannot wait, rather than worry about them. DECISIONS ABOUT CIRCUMCISION You may or may not know the sex of your baby. If you know your baby is a boy, it may be time to think about circumcision. Circumcision is the removal of the foreskin of the penis. This is the skin that covers the sensitive end of the penis. There is no proven medical need for this. Often this decision is made on what is popular at the time or based upon religious beliefs and social issues. You can discuss these issues with your caregiver or pediatrician. SEEK IMMEDIATE MEDICAL CARE IF:   An unexplained oral temperature above 102 F (38.9 C) develops, or as your caregiver suggests.  You have leaking of fluid from the vagina (birth canal). If leaking membranes are suspected, take your temperature and tell your caregiver of this when you call.  There is vaginal spotting, bleeding or passing clots. Tell your caregiver of the amount and how many pads are used.  You develop a bad smelling vaginal discharge with a change in the color from clear to white.  You develop vomiting that lasts more than 24 hours.  You develop chills or fever.  You develop shortness of breath.  You develop burning on urination.  You loose more than 2 pounds of weight or gain more than 2 pounds of weight or as suggested by your caregiver.  You notice sudden swelling of your face,  hands, and feet or legs.  You develop belly (abdominal) pain. Round ligament discomfort is a common non-cancerous (benign) cause of abdominal pain in pregnancy. Your caregiver still must evaluate you.  You develop a severe headache that does not go away.  You develop visual problems,  blurred or double vision.  If you have not felt your baby move for more than 1 hour. If you think the baby is not moving as much as usual, eat something with sugar in it and lie down on your left side for an hour. The baby should move at least 4 to 5 times per hour. Call right away if your baby moves less than that.  You fall, are in a car accident or any kind of trauma.  There is mental or physical violence at home. Document Released: 03/28/2001 Document Revised: 06/26/2011 Document Reviewed: 09/30/2008 [facility] Patient Information 2013 [place], [place].

## 2012-04-29 NOTE — Progress Notes (Signed)
Pulse- 107 Patient reports a lot of pain coming from hemorrhoids, also reports rectal bleeding

## 2012-04-30 ENCOUNTER — Inpatient Hospital Stay (HOSPITAL_COMMUNITY)
Admission: AD | Admit: 2012-04-30 | Discharge: 2012-04-30 | Disposition: A | Discharge status: Home / Self Care | Payer: Medicaid Other | Source: Ambulatory Visit | Attending: Family Medicine | Admitting: Family Medicine

## 2012-04-30 ENCOUNTER — Inpatient Hospital Stay (HOSPITAL_COMMUNITY): Payer: Medicaid Other

## 2012-04-30 ENCOUNTER — Encounter (HOSPITAL_COMMUNITY): Payer: Self-pay | Admitting: *Deleted

## 2012-04-30 DIAGNOSIS — A499 Bacterial infection, unspecified: Secondary | ICD-10-CM | POA: Insufficient documentation

## 2012-04-30 DIAGNOSIS — O2390 Unspecified genitourinary tract infection in pregnancy, unspecified trimester: Secondary | ICD-10-CM | POA: Insufficient documentation

## 2012-04-30 DIAGNOSIS — B9689 Other specified bacterial agents as the cause of diseases classified elsewhere: Secondary | ICD-10-CM

## 2012-04-30 DIAGNOSIS — R109 Unspecified abdominal pain: Secondary | ICD-10-CM | POA: Insufficient documentation

## 2012-04-30 DIAGNOSIS — O4690 Antepartum hemorrhage, unspecified, unspecified trimester: Secondary | ICD-10-CM | POA: Insufficient documentation

## 2012-04-30 DIAGNOSIS — N760 Acute vaginitis: Secondary | ICD-10-CM | POA: Insufficient documentation

## 2012-04-30 LAB — URINALYSIS, ROUTINE W REFLEX MICROSCOPIC
Bilirubin Urine: NEGATIVE
Ketones, ur: 40 mg/dL — AB
Nitrite: NEGATIVE
Protein, ur: NEGATIVE mg/dL
Urobilinogen, UA: 0.2 mg/dL (ref 0.0–1.0)

## 2012-04-30 LAB — WET PREP, GENITAL: Trich, Wet Prep: NONE SEEN

## 2012-04-30 MED ORDER — ACETAMINOPHEN 500 MG PO TABS
1000.0000 mg | ORAL_TABLET | Freq: Once | ORAL | Status: AC
  Administered 2012-04-30: 1000 mg via ORAL
  Filled 2012-04-30: qty 2

## 2012-04-30 MED ORDER — METRONIDAZOLE 500 MG PO TABS: 500.0000 mg | ORAL_TABLET | Freq: Two times a day (BID) | ORAL | Status: AC

## 2012-04-30 NOTE — MAU Note (Signed)
C/o diarrhea and nausea along with bleeding started this AM (0730);

## 2012-04-30 NOTE — MAU Note (Signed)
Hospitalized back in December before Christmas for a week for vaginal bleeding;

## 2012-04-30 NOTE — MAU Note (Signed)
No bleeding or pain at this time; was not wearing a peri-pad;

## 2012-04-30 NOTE — MAU Provider Note (Signed)
History     CSN: [id number]  Arrival date and time: 04/30/12 1412   None     Chief Complaint  Patient presents with  . Vaginal Bleeding   HPI 32 y.o. G3P1011 at [redacted]w[redacted]d with bleeding per vagina since this morning. Amount is similar to start of period. Happened when wiping after voided x 2. No spotting between voids. No dysuria. No vaginal discharge or itching. Also having some abdominal pain - epigastric and lower quadrants since this morning with nausea. Abdominal pain is sharp, comes and goes q 5 minutes. Baby moving well. No LOF.  Last intercourse several months ago. Has been constipated with hemorrhoids and rectal bleeding recently. Was started on MiraLax yesterday in clinic but has not taken yet because started having diarrhea today.   Hospitalized in December for pinkish vaginal discharge with no further bleeding, reassuring fetal status and no evidence of preterm labor. She has a UTI at that time and rectal bleeding.   OB History    Grav Para Term Preterm Abortions TAB SAB Ect Mult Living   3 1 1  1  1   1       Past Medical History  Diagnosis Date  . Pregnancy   . Pregnancy induced hypertension     Past Surgical History  Procedure Date  . No past surgeries   . C-sectionx1   . Cesarean section     Family History  Problem Relation Age of Onset  . Colon cancer Neg Hx   . Esophageal cancer Neg Hx   . Stomach cancer Neg Hx   . Lung cancer Paternal Grandmother   . Diabetes Maternal Grandfather   . Kidney disease Paternal Uncle     History  Substance Use Topics  . Smoking status: Never Smoker   . Smokeless tobacco: Never Used  . Alcohol Use: No    Allergies:  Allergies  Allergen Reactions  . Naproxen Sodium     Chest burns     Prescriptions prior to admission  Medication Sig Dispense Refill  . Prenatal Vit-Fe Fumarate-FA (PRENATAL MULTIVITAMIN) TABS Take 1 tablet by mouth daily.      . ranitidine (ZANTAC) 150 MG tablet Take 1 tablet (150 mg total) by  mouth 2 (two) times daily.  30 tablet  1  . lidocaine (XYLOCAINE) 5 % ointment Apply topically as needed.  35.44 g  1  . polyethylene glycol powder (GLYCOLAX) powder Take 17 g by mouth 3 (three) times daily as needed.  255 g  3    Review of Systems  Constitutional: Negative for fever and chills.  Eyes: Negative for blurred vision and double vision.  Gastrointestinal: Positive for nausea, abdominal pain and diarrhea. Negative for vomiting and constipation.  Genitourinary: Negative for dysuria.  Neurological: Positive for headaches.   Physical Exam   Blood pressure 116/67, pulse 89, temperature 98.5 F (36.9 C), temperature source Oral, resp. rate 18, height 5' 3.5" (1.613 m), weight 127.007 kg (280 lb).  Physical Exam  Constitutional: She is oriented to person, place, and time. She appears well-developed and well-nourished. No distress.  HENT:  Head: Normocephalic and atraumatic.  Eyes: Conjunctivae normal and EOM are normal.  Neck: Normal range of motion.  Cardiovascular: Normal rate, regular rhythm and normal heart sounds.   Respiratory: Effort normal and breath sounds normal. No respiratory distress.  GI: Soft. There is tenderness (diffuse, mild). There is no rebound and no guarding.  Genitourinary:       Normal external genitalia. No blood  in vaginal vault or on cervix. Creamy white discharge, odor. Cervix visually closed. Erythema and irritation around introitus. No CMT but uncomfortable with spec exam. Tender, swollen external hemorrhoids at 6:00 and 12:00  Neurological: She is alert and oriented to person, place, and time.  Skin: Skin is warm and dry.  Psychiatric: She has a normal mood and affect.    FHTs:  120, mod variability, accels present, no decels.  Reactive, Cat I Toco:  Irregular contractions and irritability  Cervix:  FT externally/long/high  Results for orders placed during the hospital encounter of 04/30/12 (from the past 24 hour(s))  URINALYSIS, ROUTINE W  REFLEX MICROSCOPIC     Status: Abnormal   Collection Time   04/30/12  2:20 PM      Component Value Range   Color, Urine YELLOW  YELLOW   APPearance CLOUDY (*) CLEAR   Specific Gravity, Urine >1.030 (*) 1.005 - 1.030   pH 6.0  5.0 - 8.0   Glucose, UA NEGATIVE  NEGATIVE mg/dL   Hgb urine dipstick MODERATE (*) NEGATIVE   Bilirubin Urine NEGATIVE  NEGATIVE   Ketones, ur 40 (*) NEGATIVE mg/dL   Protein, ur NEGATIVE  NEGATIVE mg/dL   Urobilinogen, UA 0.2  0.0 - 1.0 mg/dL   Nitrite NEGATIVE  NEGATIVE   Leukocytes, UA TRACE (*) NEGATIVE  URINE MICROSCOPIC-ADD ON     Status: Abnormal   Collection Time   04/30/12  2:20 PM      Component Value Range   Squamous Epithelial / LPF MANY (*) RARE   WBC, UA 0-2  <3 WBC/hpf   Bacteria, UA MANY (*) RARE   Urine-Other MUCOUS PRESENT    WET PREP, GENITAL     Status: Abnormal   Collection Time   04/30/12  3:37 PM      Component Value Range   Yeast Wet Prep HPF POC NONE SEEN  NONE SEEN   Trich, Wet Prep NONE SEEN  NONE SEEN   Clue Cells Wet Prep HPF POC MODERATE (*) NONE SEEN   WBC, Wet Prep HPF POC FEW (*) NONE SEEN   *RADIOLOGY REPORT*  Clinical Data: Vaginal bleeding, pregnant.   LIMITED OBSTETRIC ULTRASOUND  Number of Fetuses: 1  Heart Rate: 122 bpm  Movement: Present  Presentation: Cephalic  Placental Location: Anterior  Previa: None  Amniotic Fluid (Subjective): Normal  Vertical Pocket AFI 11.2 cm (5%ile = 7.9 cm; 95%ile = 24.9 cm)  BPD: 8.5 cm 34 w 1 d EDC: 06/10/2012   MATERNAL FINDINGS:  Cervix: 3.3 cm length, closed  Uterus/Adnexae: Unremarkable   IMPRESSION:  1. Single live 34-week-1-day intrauterine gestation in cephalic  presentation. No evidence of abruption. Recommend followup with non-emergent complete OB 14+ wk US examination for fetal biometric evaluation and anatomic survey if  not already performed.   Original Report Authenticated By: D. Andria Rhein, MD   MAU Course  Procedures   Assessment and Plan  32 y.o.  G3P1011 at [redacted]w[redacted]d with vaginal bleeding. - Likely due to bacterial vaginosis, sono and FHTs reassuring. No cervical change, cervix closed and long on ultrasound - Patient reluctant to go home. Reassured and discussed labor precautions.  - Treat with metronidazole, return if more bleeding, ctx, LOF or baby not moving. Discussed kick counts.   Napoleon Form 04/30/2012, 3:22 PM

## 2012-05-01 LAB — GC/CHLAMYDIA PROBE AMP
CT Probe RNA: NEGATIVE
GC Probe RNA: NEGATIVE

## 2012-05-01 NOTE — MAU Provider Note (Signed)
Chart reviewed and agree with management and plan.  

## 2012-05-13 ENCOUNTER — Encounter: Payer: Medicaid Other | Admitting: Family Medicine

## 2012-05-20 ENCOUNTER — Encounter: Payer: Medicaid Other | Admitting: Obstetrics and Gynecology

## 2012-05-22 ENCOUNTER — Inpatient Hospital Stay (HOSPITAL_COMMUNITY)
Admission: AD | Admit: 2012-05-22 | Discharge: 2012-05-22 | Disposition: A | Discharge status: Home / Self Care | Payer: Medicaid Other | Source: Ambulatory Visit | Attending: Obstetrics and Gynecology | Admitting: Obstetrics and Gynecology

## 2012-05-22 ENCOUNTER — Ambulatory Visit (INDEPENDENT_AMBULATORY_CARE_PROVIDER_SITE_OTHER): Payer: Medicaid Other | Admitting: Family Medicine

## 2012-05-22 ENCOUNTER — Other Ambulatory Visit (HOSPITAL_COMMUNITY)
Admission: RE | Admit: 2012-05-22 | Discharge: 2012-05-22 | Disposition: A | Discharge status: Home / Self Care | Payer: Medicaid Other | Source: Ambulatory Visit | Attending: Family Medicine | Admitting: Family Medicine

## 2012-05-22 ENCOUNTER — Encounter (HOSPITAL_COMMUNITY): Payer: Self-pay | Admitting: *Deleted

## 2012-05-22 VITALS — BP 144/84 | Wt 289.7 lb

## 2012-05-22 DIAGNOSIS — O169 Unspecified maternal hypertension, unspecified trimester: Secondary | ICD-10-CM

## 2012-05-22 DIAGNOSIS — O34219 Maternal care for unspecified type scar from previous cesarean delivery: Secondary | ICD-10-CM

## 2012-05-22 DIAGNOSIS — O99891 Other specified diseases and conditions complicating pregnancy: Secondary | ICD-10-CM | POA: Insufficient documentation

## 2012-05-22 DIAGNOSIS — O139 Gestational [pregnancy-induced] hypertension without significant proteinuria, unspecified trimester: Secondary | ICD-10-CM

## 2012-05-22 DIAGNOSIS — Z113 Encounter for screening for infections with a predominantly sexual mode of transmission: Secondary | ICD-10-CM | POA: Insufficient documentation

## 2012-05-22 DIAGNOSIS — R030 Elevated blood-pressure reading, without diagnosis of hypertension: Secondary | ICD-10-CM | POA: Insufficient documentation

## 2012-05-22 DIAGNOSIS — O0990 Supervision of high risk pregnancy, unspecified, unspecified trimester: Secondary | ICD-10-CM

## 2012-05-22 DIAGNOSIS — R51 Headache: Secondary | ICD-10-CM

## 2012-05-22 LAB — CBC
HCT: 30.4 % — ABNORMAL LOW (ref 36.0–46.0)
HCT: 32.1 % — ABNORMAL LOW (ref 36.0–46.0)
Hemoglobin: 10.3 g/dL — ABNORMAL LOW (ref 12.0–15.0)
Hemoglobin: 10.3 g/dL — ABNORMAL LOW (ref 12.0–15.0)
MCH: 27.2 pg (ref 26.0–34.0)
MCHC: 33.9 g/dL (ref 30.0–36.0)
MCHC: 32.1 g/dL (ref 30.0–36.0)
MCV: 84.9 fL (ref 78.0–100.0)
RBC: 3.76 MIL/uL — ABNORMAL LOW (ref 3.87–5.11)
RDW: 15.8 % — ABNORMAL HIGH (ref 11.5–15.5)

## 2012-05-22 LAB — URINALYSIS, ROUTINE W REFLEX MICROSCOPIC
Hgb urine dipstick: NEGATIVE
Leukocytes, UA: NEGATIVE
Nitrite: NEGATIVE
Protein, ur: NEGATIVE mg/dL
Specific Gravity, Urine: >1.03 — ABNORMAL HIGH (ref 1.005–1.030)
Urobilinogen, UA: 0.2 mg/dL (ref 0.0–1.0)

## 2012-05-22 LAB — COMPREHENSIVE METABOLIC PANEL
ALT: 13 U/L (ref 0–35)
AST: 17 U/L (ref 0–37)
AST: 22 U/L (ref 0–37)
Albumin: 2.5 g/dL — ABNORMAL LOW (ref 3.5–5.2)
Alkaline Phosphatase: 100 U/L (ref 39–117)
Alkaline Phosphatase: 108 U/L (ref 39–117)
BUN: 7 mg/dL (ref 6–23)
Calcium: 8.5 mg/dL (ref 8.4–10.5)
Calcium: 8.8 mg/dL (ref 8.4–10.5)
Creat: 0.55 mg/dL (ref 0.50–1.10)
Glucose, Bld: 133 mg/dL — ABNORMAL HIGH (ref 70–99)
Glucose, Bld: 97 mg/dL (ref 70–99)
Potassium: 3.9 mEq/L (ref 3.5–5.1)
Sodium: 132 mEq/L — ABNORMAL LOW (ref 135–145)
Total Protein: 6.4 g/dL (ref 6.0–8.3)

## 2012-05-22 LAB — PROTEIN / CREATININE RATIO, URINE: Total Protein, Urine: 18.3 mg/dL

## 2012-05-22 MED ORDER — BUTALBITAL-APAP-CAFFEINE 50-325-40 MG PO TABS
1.0000 | ORAL_TABLET | Freq: Once | ORAL | Status: AC
  Administered 2012-05-22: 1 via ORAL
  Filled 2012-05-22: qty 1

## 2012-05-22 NOTE — Patient Instructions (Addendum)
Hypertension During Pregnancy Hypertension is also called high blood pressure. It can occur at any time in life and during pregnancy. When you have hypertension, there is extra pressure inside your blood vessels that carry blood from the heart to the rest of your body (arteries). Hypertension during pregnancy can cause problems for you and your baby. Your baby might not weigh as much as it should at birth or might be born early (premature). Very bad cases of hypertension during pregnancy can be life-threatening.  There are different types of hypertension during pregnancy.   Chronic hypertension. This happens when a woman has hypertension before pregnancy and it continues during pregnancy.  Gestational hypertension. This is when hypertension develops during pregnancy.  Preeclampsia or toxemia of pregnancy. This is a very serious type of hypertension that develops only during pregnancy. It is a disease that affects the whole body (systemic) and can be very dangerous for both mother and baby.  Gestational hypertension and preeclampsia usually go away after your baby is born. Blood pressure generally stabilizes within 6 weeks. Women who have hypertension during pregnancy have a greater chance of developing hypertension later in life or with future pregnancies. UNDERSTANDING BLOOD PRESSURE Blood pressure moves blood in your body. Sometimes, the force that moves the blood becomes too strong.  A blood pressure reading is given in 2 numbers and looks like a fraction.  The top number is called the systolic pressure. When your heart beats, it forces more blood to flow through the arteries. Pressure inside the arteries goes up.  The bottom number is the diastolic pressure. Pressure goes down between beats. That is when the heart is resting.  You may have hypertension if:  Your systolic blood pressure is above 140.  Your diastolic pressure is above 90. RISK FACTORS Some factors make you more likely to  develop hypertension during pregnancy. Risk factors include:  Having hypertension before pregnancy.  Having hypertension during a previous pregnancy.  Being overweight.  Being older than 40.  Being pregnant with more than 1 baby (multiples).  Having diabetes or kidney problems. SYMPTOMS Chronic and gestational hypertension may not cause symptoms. Preeclampsia has symptoms, which may include:  Increased protein in your urine. Your caregiver will check for this at every prenatal visit.  Swelling of your hands and face.  Rapid weight gain.  Headaches.  Visual changes.  Being bothered by light.  Abdominal pain, especially in the right upper area.  Chest pain.  Shortness of breath.  Increased reflexes.  Seizures. Seizures occur with a more severe form of preeclampsia, called eclampsia. DIAGNOSIS   You may be diagnosed with hypertension during pregnancy during a regular prenatal exam. At each visit, tests may include:  Blood pressure checks.  A urine test to check for protein in your urine.  The type of hypertension you are diagnosed with depends on when you developed it. It also depends on your specific blood pressure reading.  Developing hypertension before 20 weeks of pregnancy is consistent with chronic hypertension.  Developing hypertension after 20 weeks of pregnancy is consistent with gestational hypertension.  Hypertension with increased urinary protein is diagnosed as preeclampsia.  Blood pressure measurements that stay above 160 systolic or 110 diastolic are a sign of severe preeclampsia. TREATMENT Treatment for hypertension during pregnancy varies. Treatment depends on the type of hypertension and how serious it is.  If you take medicine for chronic hypertension, you may need to switch medicines.  Drugs called ACE inhibitors should not be taken during pregnancy.    Low-dose aspirin may be suggested for women who have risk factors for preeclampsia.  If  you have gestational hypertension, you may need to take a blood pressure medicine that is safe during pregnancy. Your caregiver will recommend the appropriate medicine.  If you have severe preeclampsia, you may need to be in the hospital. Caregivers will watch you and the baby very closely. You also may need to take medicine (magnesium sulfate) to prevent seizures and lower blood pressure.  Sometimes an early delivery is needed. This may be the case if the condition worsens. It would be done to protect you and the baby. The only cure for preeclampsia is delivery. HOME CARE INSTRUCTIONS  Schedule and keep all of your regular prenatal care.  Follow your caregiver's instructions for taking medicines. Tell your caregiver about all medicines you take. This includes over-the-counter medicines.  Eat as little salt as possible.  Get regular exercise.  Do not drink alcohol.  Do not use tobacco products.  Do not drink products with caffeine.  Lie on your left side when resting.  Tell your doctor if you have any preeclampsia symptoms. SEEK IMMEDIATE MEDICAL CARE IF:  You have severe abdominal pain.  You have sudden swelling in the hands, ankles, or face.  You gain 4 pounds (1.8 kg) or more in 1 week.  You vomit repeatedly.  You have vaginal bleeding.  You do not feel the baby moving as much.  You have a headache.  You have blurred or double vision.  You have muscle twitching or spasms.  You have shortness of breath.  You have blue fingernails and lips.  You have blood in your urine. MAKE SURE YOU:  Understand these instructions.  Will watch your condition.  Will get help right away if you are not doing well. Document Released: 12/20/2010 Document Revised: 06/26/2011 Document Reviewed: 12/20/2010 [facility] Patient Information 2013 [place], [place].  Trial of Labor After Cesarean Information A trial of labor after cesarean (TOLAC) is when a woman tries to give birth  vaginally after a previous cesarean delivery. When successful, this is called a vaginal birth after cesarean (VBAC). TOLAC may be a safe and appropriate option for you depending on your history and other risk factors. The chances of a successful VBAC depends on the reason you previously had a cesarean. Women have higher VBAC success rates if their cesarean was due to:   A breech (malpresentation) baby.  Emergency reasons.  Medical factors like hypertension. Women have lower VBAC success rates if their cesarean was done because they:   Did not dilate.  Could not push their baby out. Talk to your caregiver about VBAC benefits, risks, and success rates. Discuss your plans for having more children. After this is done, you and your caregiver can decide whether to attempt TOLAC.  MOST SUCCESSFUL CANDIDATES FOR TOLAC TOLAC is possible for some women who:  Had 1 past horizontal (low transverse) incision during a cesarean.  Are carrying twins and had 1 past low transverse incision during a cesarean.  Do not have a very large (macrosomic) baby.  Do not have a vertical (classical) uterine scar.  Are in labor and are less than [redacted] weeks pregnant. TOLAC is also supported for women who meet appropriate criteria and:  Are under the age of 21.  Are tall and have a body mass index (BMI) of less than 30.  Are likely carrying a baby that is at an average, or less than average, birth weight.  Have an unknown  uterine scar.  Deliver in a hospital with a proper medical team. This team should be able to handle possible complications such as a uterine rupture.  Have thorough counseling about the benefits and risks of TOLAC.  Have discussed future pregnancy plans with their caregiver.  Plan to have several more pregnancies. TOLAC may be most appropriate for women who meet the above guidelines and who plan to have more pregnancies. TOLAC is not recommended for home births. LEAST SUCCESSFUL CANDIDATES  FOR TOLAC TOLAC is least successful for women who:  Have an induced labor with an unfavorable cervix. An unfavorable cervix is when the cerix is not dilating sufficiently (among other factors).  Have never had a vaginal delivery.  Had a past cesarean for failed progress.  Had a past cesarean due to abnormal fetal heart rate patterns on the monitor (nonrassuring tracing).  Have a macrosomic baby.  Are postterm. This means the pregancy has lasted beyond 42 weeks from the first day of the last menstrual period. There are no high-quality studies comparing the risks and benefits of TOLAC and elective repeat cesarean deliveries. SUGGESTED BENEFITS OF TOLAC The benefits of TOLAC include:   Having a faster recovery time.  Having less pain than with a cesarean.  Having the partner involved in the delivery process. SUGGESTED RISKS OF TOLAC The highest risk of complications happens to women who attempt a TOLAC and fail. A failed TOLAC results in an unplanned cesarean delivery. Risks related to [facility] or repeat cesarean surgeries include:   Blood loss.  Infection.  Blood clot.  Injury to surrounding tissues or organs.  Removal of the uterus (hysterectomy).  Potential problems with the placenta (placenta previa, placental acreta) in future pregnancies. While very rare, the main concerns with TOLAC are:  Rupture of the uterine scar from a past cesarean surgery.  Needing an emergency cesarean surgery.  Having a bad outcome for the baby (perinatal morbidity).  Having closely spaced pregnancies (less than 6 months apart). ADVANTAGES TO HAVING A REPEAT CESAREAN DELIVERY   It is convenient to be able to schedule a cesarean delivery.  A woman can easily have surgery to prevent future pregnancies (sterilization) during a cesarean, if desired.  There is a lower rate of hysterectomy later in life due to the uterus falling down (pelvic relaxation).  The risks associated with TOLAC are  not applicable. FOR MORE INFORMATION [employer] of Obstetricians [employer] Gynecologists: www.acog.org [employer]: www.midwife.org Document Released: 12/20/2010 Document Revised: 06/26/2011 Document Reviewed: 12/20/2010 [facility] Patient Information 2013 [place], [place].

## 2012-05-22 NOTE — Progress Notes (Signed)
Pt states she has had diarrhea for several weeks prior to  Having her lax prescription filled

## 2012-05-22 NOTE — ED Notes (Signed)
Chief Complaint:  No chief complaint on file.   First Provider Initiated Contact with Patient 05/22/12 1651      HPI: Holly [name] is a 32 y.o. G3P1011 at 102w6d who presents to maternity admissions reporting headache. She had elevated BP in clinic this am to 144/84 and told to come back to MAU if she had worsening headache.  Headache is right sided, constant, not relieved with sleeping. Associated with nausea. Did not take any medicine. No change in vision. Also reports some right upper quadrant pain for 1 week.  Denies contractions, leakage of fluid or vaginal bleeding. Good fetal movement.   Pregnancy Course:  Admission on 03/29/12 for vaginal bleeding at 30 wga. Found to have urine positive for E-Coli which was treated with bactrim DS bid.  H/o pregnancy induced hypertension in previous pregnancy  Past Medical History: Past Medical History  Diagnosis Date  . Pregnancy   . Pregnancy induced hypertension     Past obstetric history: OB History    Grav Para Term Preterm Abortions TAB SAB Ect Mult Living   3 1 1  1  1   1      # Outc Date GA Lbr Len/2nd Wgt Sex Del Anes PTL Lv   1 TRM 12/07 [redacted]w[redacted]d  4lb5oz(1.956kg) F LTCS EPI  Yes   Comments: PIH, losing fluid   2 SAB            3 CUR               Past Surgical History: Past Surgical History  Procedure Date  . No past surgeries   . C-sectionx1   . Cesarean section     Family History: Family History  Problem Relation Age of Onset  . Colon cancer Neg Hx   . Esophageal cancer Neg Hx   . Stomach cancer Neg Hx   . Lung cancer Paternal Grandmother   . Diabetes Maternal Grandfather   . Kidney disease Paternal Uncle   . Hypertension Father   . Hyperlipidemia Father     Social History: History  Substance Use Topics  . Smoking status: Never Smoker   . Smokeless tobacco: Never Used  . Alcohol Use: No    Allergies:  Allergies  Allergen Reactions  . Naproxen Sodium     Chest burns     Meds:  Prescriptions prior  to admission  Medication Sig Dispense Refill  . Prenatal Vit-Fe Fumarate-FA (PRENATAL MULTIVITAMIN) TABS Take 1 tablet by mouth daily.      . ranitidine (ZANTAC) 150 MG tablet Take 150 mg by mouth daily as needed. For acid reflux        ROS: Pertinent findings in history of present illness.  Physical Exam  Blood pressure 131/80, pulse 89, temperature 98.2 F (36.8 C), temperature source Oral, resp. rate 18. GENERAL: Well-developed, well-nourished female in no acute distress.  HEENT: normocephalic HEART: normal rate RESP: normal effort ABDOMEN: Soft, mildly tender in epigastric area, no RUQ tenderness, gravid appropriate for gestational age EXTREMITIES: Nontender, trace edema NEURO: alert and oriented, CN2-12 grossly intact, DTR 1+ bilaterally.     FHT:  Baseline 120's , moderate variability, accelerations present, no decelerations Contractions: none   Labs: Results for orders placed during the hospital encounter of 05/22/12 (from the past 24 hour(s))  URINALYSIS, ROUTINE W REFLEX MICROSCOPIC     Status: Abnormal   Collection Time   05/22/12  4:20 PM      Component Value Range   Color, Urine YELLOW  YELLOW   APPearance CLEAR  CLEAR   Specific Gravity, Urine >1.030 (*) 1.005 - 1.030   pH 6.0  5.0 - 8.0   Glucose, UA NEGATIVE  NEGATIVE mg/dL   Hgb urine dipstick NEGATIVE  NEGATIVE   Bilirubin Urine NEGATIVE  NEGATIVE   Ketones, ur NEGATIVE  NEGATIVE mg/dL   Protein, ur NEGATIVE  NEGATIVE mg/dL   Urobilinogen, UA 0.2  0.0 - 1.0 mg/dL   Nitrite NEGATIVE  NEGATIVE   Leukocytes, UA NEGATIVE  NEGATIVE  COMPREHENSIVE METABOLIC PANEL     Status: Abnormal   Collection Time   05/22/12  5:10 PM      Component Value Range   Sodium 132 (*) 135 - 145 mEq/L   Potassium 3.9  3.5 - 5.1 mEq/L   Chloride 101  96 - 112 mEq/L   CO2 20  19 - 32 mEq/L   Glucose, Bld 97  70 - 99 mg/dL   BUN 7  6 - 23 mg/dL   Creatinine, Ser 1.19 (*) 0.50 - 1.10 mg/dL   Calcium 8.8  8.4 - 14.7 mg/dL   Total  Protein 6.4  6.0 - 8.3 g/dL   Albumin 2.5 (*) 3.5 - 5.2 g/dL   AST 22  0 - 37 U/L   ALT 13  0 - 35 U/L   Alkaline Phosphatase 108  39 - 117 U/L   Total Bilirubin 0.2 (*) 0.3 - 1.2 mg/dL   GFR calc non Af Amer >90  >90 mL/min   GFR calc Af Amer >90  >90 mL/min  CBC     Status: Abnormal   Collection Time   05/22/12  5:10 PM      Component Value Range   WBC 8.8  4.0 - 10.5 K/uL   RBC 3.78 (*) 3.87 - 5.11 MIL/uL   Hemoglobin 10.3 (*) 12.0 - 15.0 g/dL   HCT 82.9 (*) 56.2 - 13.0 %   MCV 84.9  78.0 - 100.0 fL   MCH 27.2  26.0 - 34.0 pg   MCHC 32.1  30.0 - 36.0 g/dL   RDW 86.5 (*) 78.4 - 69.6 %   Platelets 328  150 - 400 K/uL    Imaging:  US Ob Limited  04/30/2012  *RADIOLOGY REPORT*  Clinical Data: Vaginal bleeding, pregnant.  LIMITED OBSTETRIC ULTRASOUND  Number of Fetuses: 1 Heart Rate: 122 bpm Movement: Present  Presentation: Cephalic Placental Location: Anterior Previa: None Amniotic Fluid (Subjective): Normal  Vertical Pocket      AFI 11.2 cm  (5%ile = 7.9 cm; 95%ile = 24.9 cm)  BPD:  8.5 cm      34 w  1 d         EDC:  06/10/2012  MATERNAL FINDINGS: Cervix: 3.3 cm length, closed Uterus/Adnexae:  Unremarkable  IMPRESSION:  1.  Single live 34-week-1-day intrauterine gestation in cephalic presentation.  No evidence of abruption.  Recommend followup with non-emergent complete OB 14+ wk US examination for fetal biometric evaluation and anatomic survey if not already performed.   Original Report Authenticated By: D. Andria Rhein, MD    MAU Course: - CBC, CMP drawn. Patient monitored for NST. NST reactive.  - urine Pr/Cr collected.   Normal platelet count, normal LFT's, no protein in urine with BP's in MAU between 128-131/72-80 which are reassuring. NST reactive and reassuring. Headache significantly improved without medications. Will treat it with fiorinal 50 x1.  Pr/Cr ratio still pending. Patient stable for discharge with close follow up next Monday.  Assessment: 1. Headache      Plan: Discharge home Labor precautions and fetal kick counts     Follow-up Information    Follow up with [facility]. In 1 week. (keep your appointment for Monday)    Contact information:   [address] [place] [address]           Medication List     As of 05/22/2012  7:49 PM    TAKE these medications         prenatal multivitamin Tabs   Take 1 tablet by mouth daily.      ranitidine 150 MG tablet   Commonly known as: ZANTAC   Take 150 mg by mouth daily as needed. For acid reflux         Lonia Skinner, MD 05/22/2012 7:49 PM

## 2012-05-22 NOTE — Progress Notes (Signed)
Pulse- 98 

## 2012-05-22 NOTE — Progress Notes (Signed)
Pt states she has a headache that comes and goes and she rates it an 8. Headache is not present at this time

## 2012-05-22 NOTE — Progress Notes (Signed)
Elevated BP today. Will get PIH labs and order 24 hour urine protein. Pt cannot collect until this weekend so will get prot/creat ratio today. Had prot/creat ratio of 0.18 on 12/29. Pt has had mild headaches off and on, none today. No vision changes. Had PIH last pregnancy with low fluid, delivered at 37 weeks.

## 2012-05-23 LAB — PROTEIN / CREATININE RATIO, URINE: Total Protein, Urine: 20 mg/dL

## 2012-05-26 ENCOUNTER — Inpatient Hospital Stay (EMERGENCY_DEPARTMENT_HOSPITAL)
Admission: AD | Admit: 2012-05-26 | Discharge: 2012-05-27 | Disposition: A | Discharge status: Home / Self Care | Payer: Medicaid Other | Source: Ambulatory Visit | Attending: Obstetrics and Gynecology | Admitting: Obstetrics and Gynecology

## 2012-05-26 ENCOUNTER — Encounter (HOSPITAL_COMMUNITY): Payer: Self-pay | Admitting: *Deleted

## 2012-05-26 DIAGNOSIS — O139 Gestational [pregnancy-induced] hypertension without significant proteinuria, unspecified trimester: Secondary | ICD-10-CM | POA: Insufficient documentation

## 2012-05-26 DIAGNOSIS — O133 Gestational [pregnancy-induced] hypertension without significant proteinuria, third trimester: Secondary | ICD-10-CM

## 2012-05-26 DIAGNOSIS — R51 Headache: Secondary | ICD-10-CM | POA: Insufficient documentation

## 2012-05-26 DIAGNOSIS — O34219 Maternal care for unspecified type scar from previous cesarean delivery: Secondary | ICD-10-CM | POA: Insufficient documentation

## 2012-05-26 DIAGNOSIS — H538 Other visual disturbances: Secondary | ICD-10-CM | POA: Insufficient documentation

## 2012-05-26 DIAGNOSIS — O0990 Supervision of high risk pregnancy, unspecified, unspecified trimester: Secondary | ICD-10-CM

## 2012-05-26 DIAGNOSIS — O09899 Supervision of other high risk pregnancies, unspecified trimester: Secondary | ICD-10-CM | POA: Insufficient documentation

## 2012-05-26 DIAGNOSIS — O4693 Antepartum hemorrhage, unspecified, third trimester: Secondary | ICD-10-CM

## 2012-05-26 LAB — URINALYSIS, ROUTINE W REFLEX MICROSCOPIC
Bilirubin Urine: NEGATIVE
Specific Gravity, Urine: >1.03 — ABNORMAL HIGH (ref 1.005–1.030)
Urobilinogen, UA: 0.2 mg/dL (ref 0.0–1.0)
pH: 6 (ref 5.0–8.0)

## 2012-05-26 LAB — URINE MICROSCOPIC-ADD ON

## 2012-05-26 MED ORDER — ACETAMINOPHEN 500 MG PO TABS
1000.0000 mg | ORAL_TABLET | Freq: Once | ORAL | Status: AC
  Administered 2012-05-27: 1000 mg via ORAL
  Filled 2012-05-26: qty 2

## 2012-05-26 NOTE — ED Notes (Signed)
I examined pt and agree with documentation above and resident plan of care. MUHAMMAD,WALIDAH  

## 2012-05-27 ENCOUNTER — Encounter (HOSPITAL_COMMUNITY): Payer: Self-pay | Admitting: Family Medicine

## 2012-05-27 ENCOUNTER — Inpatient Hospital Stay (EMERGENCY_DEPARTMENT_HOSPITAL)
Admission: AD | Admit: 2012-05-27 | Discharge: 2012-05-27 | Disposition: A | Discharge status: Home / Self Care | Payer: Medicaid Other | Source: Ambulatory Visit | Attending: Obstetrics & Gynecology | Admitting: Obstetrics & Gynecology

## 2012-05-27 ENCOUNTER — Other Ambulatory Visit: Payer: Medicaid Other

## 2012-05-27 ENCOUNTER — Encounter: Payer: Self-pay | Admitting: Obstetrics & Gynecology

## 2012-05-27 ENCOUNTER — Encounter (HOSPITAL_COMMUNITY): Payer: Self-pay | Admitting: *Deleted

## 2012-05-27 ENCOUNTER — Encounter: Payer: Self-pay | Admitting: Family Medicine

## 2012-05-27 ENCOUNTER — Inpatient Hospital Stay (HOSPITAL_COMMUNITY)
Admission: AD | Admit: 2012-05-27 | Discharge: 2012-05-30 | DRG: 775 | Disposition: A | Discharge status: Home / Self Care | Payer: Medicaid Other | Source: Ambulatory Visit | Attending: Obstetrics & Gynecology | Admitting: Obstetrics & Gynecology

## 2012-05-27 ENCOUNTER — Inpatient Hospital Stay (HOSPITAL_COMMUNITY): Payer: Medicaid Other

## 2012-05-27 DIAGNOSIS — O0990 Supervision of high risk pregnancy, unspecified, unspecified trimester: Secondary | ICD-10-CM

## 2012-05-27 DIAGNOSIS — R51 Headache: Secondary | ICD-10-CM | POA: Insufficient documentation

## 2012-05-27 DIAGNOSIS — O4100X0 Oligohydramnios, unspecified trimester, not applicable or unspecified: Principal | ICD-10-CM | POA: Diagnosis present

## 2012-05-27 DIAGNOSIS — O4690 Antepartum hemorrhage, unspecified, unspecified trimester: Secondary | ICD-10-CM

## 2012-05-27 DIAGNOSIS — O139 Gestational [pregnancy-induced] hypertension without significant proteinuria, unspecified trimester: Secondary | ICD-10-CM | POA: Diagnosis present

## 2012-05-27 DIAGNOSIS — O09899 Supervision of other high risk pregnancies, unspecified trimester: Secondary | ICD-10-CM

## 2012-05-27 DIAGNOSIS — O34219 Maternal care for unspecified type scar from previous cesarean delivery: Secondary | ICD-10-CM

## 2012-05-27 DIAGNOSIS — O4693 Antepartum hemorrhage, unspecified, third trimester: Secondary | ICD-10-CM

## 2012-05-27 DIAGNOSIS — O169 Unspecified maternal hypertension, unspecified trimester: Secondary | ICD-10-CM

## 2012-05-27 DIAGNOSIS — O359XX0 Maternal care for (suspected) fetal abnormality and damage, unspecified, not applicable or unspecified: Secondary | ICD-10-CM

## 2012-05-27 DIAGNOSIS — O288 Other abnormal findings on antenatal screening of mother: Secondary | ICD-10-CM

## 2012-05-27 LAB — URINALYSIS, ROUTINE W REFLEX MICROSCOPIC
Bilirubin Urine: NEGATIVE
Hgb urine dipstick: NEGATIVE
Ketones, ur: 15 mg/dL — AB
Nitrite: NEGATIVE
Protein, ur: NEGATIVE mg/dL
Urobilinogen, UA: 0.2 mg/dL (ref 0.0–1.0)

## 2012-05-27 LAB — CBC
Hemoglobin: 10 g/dL — ABNORMAL LOW (ref 12.0–15.0)
MCH: 27.1 pg (ref 26.0–34.0)
MCH: 27.2 pg (ref 26.0–34.0)
MCHC: 32.3 g/dL (ref 30.0–36.0)
MCHC: 32.3 g/dL (ref 30.0–36.0)
MCV: 84.4 fL (ref 78.0–100.0)
Platelets: 316 10*3/uL (ref 150–400)
Platelets: 336 10*3/uL (ref 150–400)
RDW: 15.5 % (ref 11.5–15.5)
RDW: 15.5 % (ref 11.5–15.5)
WBC: 8.6 10*3/uL (ref 4.0–10.5)

## 2012-05-27 LAB — PROTEIN / CREATININE RATIO, URINE: Total Protein, Urine: 21.5 mg/dL

## 2012-05-27 LAB — COMPREHENSIVE METABOLIC PANEL
ALT: 15 U/L (ref 0–35)
Albumin: 2.6 g/dL — ABNORMAL LOW (ref 3.5–5.2)
Alkaline Phosphatase: 116 U/L (ref 39–117)
BUN: 7 mg/dL (ref 6–23)
Chloride: 100 mEq/L (ref 96–112)
Potassium: 3.7 mEq/L (ref 3.5–5.1)
Sodium: 130 mEq/L — ABNORMAL LOW (ref 135–145)
Total Bilirubin: 0.3 mg/dL (ref 0.3–1.2)

## 2012-05-27 MED ORDER — PROMETHAZINE HCL 25 MG PO TABS
25.0000 mg | ORAL_TABLET | Freq: Once | ORAL | Status: AC
  Administered 2012-05-27: 25 mg via ORAL
  Filled 2012-05-27: qty 1

## 2012-05-27 MED ORDER — CITRIC ACID-SODIUM CITRATE 334-500 MG/5ML PO SOLN: 30.0000 mL | ORAL | Status: DC | PRN

## 2012-05-27 MED ORDER — ONDANSETRON HCL 4 MG/2ML IJ SOLN
4.0000 mg | Freq: Four times a day (QID) | INTRAMUSCULAR | Status: DC | PRN
  Administered 2012-05-28: 4 mg via INTRAVENOUS
  Filled 2012-05-27: qty 2

## 2012-05-27 MED ORDER — METOCLOPRAMIDE HCL 10 MG PO TABS: 10.0000 mg | ORAL_TABLET | Freq: Once | ORAL | Status: DC

## 2012-05-27 MED ORDER — LACTATED RINGERS IV SOLN
500.0000 mL | INTRAVENOUS | Status: DC | PRN
  Administered 2012-05-28: 300 mL via INTRAVENOUS

## 2012-05-27 MED ORDER — OXYCODONE-ACETAMINOPHEN 5-325 MG PO TABS: 1.0000 | ORAL_TABLET | ORAL | Status: DC | PRN

## 2012-05-27 MED ORDER — LACTATED RINGERS IV SOLN
INTRAVENOUS | Status: DC
  Administered 2012-05-28 (×3): via INTRAVENOUS

## 2012-05-27 MED ORDER — OXYTOCIN BOLUS FROM INFUSION
500.0000 mL | INTRAVENOUS | Status: DC
  Administered 2012-05-28: 500 mL via INTRAVENOUS

## 2012-05-27 MED ORDER — ACETAMINOPHEN 325 MG PO TABS
650.0000 mg | ORAL_TABLET | Freq: Four times a day (QID) | ORAL | Status: DC | PRN
  Administered 2012-05-27: 650 mg via ORAL
  Filled 2012-05-27: qty 2

## 2012-05-27 MED ORDER — ACETAMINOPHEN 325 MG PO TABS: 650.0000 mg | ORAL_TABLET | ORAL | Status: DC | PRN

## 2012-05-27 MED ORDER — LIDOCAINE HCL (PF) 1 % IJ SOLN: 30.0000 mL | INTRAMUSCULAR | Status: DC | PRN

## 2012-05-27 MED ORDER — OXYTOCIN 40 UNITS IN LACTATED RINGERS INFUSION - SIMPLE MED
62.5000 mL/h | INTRAVENOUS | Status: DC
  Filled 2012-05-27: qty 1000

## 2012-05-27 NOTE — MAU Note (Signed)
Pt sent from clinic for elevated B/P's. Pt c/o headache and dizzyness and spots.

## 2012-05-27 NOTE — MAU Provider Note (Signed)
Attestation of Attending Supervision of Advanced Practitioner (CNM/NP): Evaluation and management procedures were performed by the Advanced Practitioner under my supervision and collaboration.  I have reviewed the Advanced Practitioner's note and chart, and I agree with the management and plan.  Rashelle Ireland 05/27/2012 7:56 AM

## 2012-05-27 NOTE — MAU Provider Note (Signed)
Chief Complaint:  No chief complaint on file.  @MAUPATCONTACT @  HPI: Holly [name] is a 32 y.o. G3P1011 at [redacted]w[redacted]d who presents to maternity admissions reporting headache and spotty vision that started last night.  Patient was in MAU last night and had workup for [facility] given that she had headache and elevated BP to 141/83. AST/ALT, platelets were normal. Pr/Cr ratio was 0.10. She was in clinic this am for lab work and felt headache with dark spots in vision and came to MAU for eval.  Denies contractions, leakage of fluid or vaginal bleeding. Good fetal movement.   Pregnancy Course:  PIH Last [place] on January 2014 limited US with AFI: 11.2.  H/o Csection with low fluid at 37wga Past Medical History: Past Medical History  Diagnosis Date  . Pregnancy   . Pregnancy induced hypertension     Past obstetric history: OB History   Grav Para Term Preterm Abortions TAB SAB Ect Mult Living   3 1 1  1  1   1      # Outc Date GA Lbr Len/2nd Wgt Sex Del Anes PTL Lv   1 TRM 12/07 [redacted]w[redacted]d  4.540JW([id number]) F LTCS EPI  Yes   Comments: PIH, losing fluid   2 SAB            3 CUR               Past Surgical History: Past Surgical History  Procedure Laterality Date  . No past surgeries    . C-sectionx1    . Cesarean section      Family History: Family History  Problem Relation Age of Onset  . Colon cancer Neg Hx   . Esophageal cancer Neg Hx   . Stomach cancer Neg Hx   . Lung cancer Paternal Grandmother   . Diabetes Maternal Grandfather   . Kidney disease Paternal Uncle   . Hypertension Father   . Hyperlipidemia Father     Social History: History  Substance Use Topics  . Smoking status: Never Smoker   . Smokeless tobacco: Never Used  . Alcohol Use: No    Allergies:  Allergies  Allergen Reactions  . Naproxen Sodium     Chest burns     Meds:  Prescriptions prior to admission  Medication Sig Dispense Refill  . Prenatal Vit-Fe Fumarate-FA (PRENATAL MULTIVITAMIN) TABS Take 1 tablet  by mouth daily.      . ranitidine (ZANTAC) 150 MG tablet Take 150 mg by mouth daily as needed. For acid reflux        ROS: Pertinent findings in history of present illness.  Physical Exam  Blood pressure 134/87, pulse 91, height 5\' 4"  (1.626 m), weight 133.448 kg (294 lb 3.2 oz), last menstrual period 08/13/2011. GENERAL: Well-developed, well-nourished female in no acute distress.  HEENT: normocephalic HEART: normal rate RESP: normal effort ABDOMEN: Soft, non-tender, gravid appropriate for gestational age EXTREMITIES: Nontender, no edema NEURO: alert and oriented    FHT:  Baseline 130 , moderate variability, accelerations present, no decelerations Contractions: none   Labs: Results for orders placed during the hospital encounter of 05/26/12 (from the past 24 hour(s))  URINALYSIS, ROUTINE W REFLEX MICROSCOPIC     Status: Abnormal   Collection Time    05/26/12 11:00 PM      Result Value Range   Color, Urine YELLOW  YELLOW   APPearance CLEAR  CLEAR   Specific Gravity, Urine >1.030 (*) 1.005 - 1.030   pH 6.0  5.0 - 8.0  Glucose, UA NEGATIVE  NEGATIVE mg/dL   Hgb urine dipstick TRACE (*) NEGATIVE   Bilirubin Urine NEGATIVE  NEGATIVE   Ketones, ur NEGATIVE  NEGATIVE mg/dL   Protein, ur NEGATIVE  NEGATIVE mg/dL   Urobilinogen, UA 0.2  0.0 - 1.0 mg/dL   Nitrite NEGATIVE  NEGATIVE   Leukocytes, UA TRACE (*) NEGATIVE  URINE MICROSCOPIC-ADD ON     Status: Abnormal   Collection Time    05/26/12 11:00 PM      Result Value Range   Squamous Epithelial / LPF FEW (*) RARE   WBC, UA 3-6  <3 WBC/hpf   RBC / HPF 3-6  <3 RBC/hpf   Bacteria, UA FEW (*) RARE  PROTEIN / CREATININE RATIO, URINE     Status: None   Collection Time    05/26/12 11:00 PM      Result Value Range   Creatinine, Urine 224.34     Total Protein, Urine 21.5     PROTEIN CREATININE RATIO 0.10  0.00 - 0.15  COMPREHENSIVE METABOLIC PANEL     Status: Abnormal   Collection Time    05/27/12 12:10 AM      Result Value  Range   Sodium 130 (*) 135 - 145 mEq/L   Potassium 3.7  3.5 - 5.1 mEq/L   Chloride 100  96 - 112 mEq/L   CO2 20  19 - 32 mEq/L   Glucose, Bld 89  70 - 99 mg/dL   BUN 7  6 - 23 mg/dL   Creatinine, Ser 2.95  0.50 - 1.10 mg/dL   Calcium 8.8  8.4 - 28.4 mg/dL   Total Protein 6.7  6.0 - 8.3 g/dL   Albumin 2.6 (*) 3.5 - 5.2 g/dL   AST 19  0 - 37 U/L   ALT 15  0 - 35 U/L   Alkaline Phosphatase 116  39 - 117 U/L   Total Bilirubin 0.3  0.3 - 1.2 mg/dL   GFR calc non Af Amer >90  >90 mL/min   GFR calc Af Amer >90  >90 mL/min  CBC     Status: Abnormal   Collection Time    05/27/12 12:10 AM      Result Value Range   WBC 8.6  4.0 - 10.5 K/uL   RBC 3.78 (*) 3.87 - 5.11 MIL/uL   Hemoglobin 10.3 (*) 12.0 - 15.0 g/dL   HCT 13.2 (*) 44.0 - 10.2 %   MCV 84.4  78.0 - 100.0 fL   MCH 27.2  26.0 - 34.0 pg   MCHC 32.3  30.0 - 36.0 g/dL   RDW 72.5  36.6 - 44.0 %   Platelets 336  150 - 400 K/uL    Imaging:  US Ob Limited  04/30/2012  *RADIOLOGY REPORT*  Clinical Data: Vaginal bleeding, pregnant.  LIMITED OBSTETRIC ULTRASOUND  Number of Fetuses: 1 Heart Rate: 122 bpm Movement: Present  Presentation: Cephalic Placental Location: Anterior Previa: None Amniotic Fluid (Subjective): Normal  Vertical Pocket      AFI 11.2 cm  (5%ile = 7.9 cm; 95%ile = 24.9 cm)  BPD:  8.5 cm      34 w  1 d         EDC:  06/10/2012  MATERNAL FINDINGS: Cervix: 3.3 cm length, closed Uterus/Adnexae:  Unremarkable  IMPRESSION:  1.  Single live 34-week-1-day intrauterine gestation in cephalic presentation.  No evidence of abruption.  Recommend followup with non-emergent complete OB 14+ wk US examination for fetal biometric evaluation  and anatomic survey if not already performed.   Original Report Authenticated By: D. Andria Rhein, MD    MAU Course: - reviewed lab from 05/27/12 with normal AST/ALT, platelet and Pr/Cr.  - will obtain serial BP's - treat headache with reglan, phenergan and tylenol.  - US showing oligohydramnios with AFI  of 3.32 less than 3rd percentile, growth: 85th percentile, AFI: 6/8 (-2 for fluid).    Assessment: 1. Unspecified high-risk pregnancy   2. Previous cesarean delivery, antepartum condition or complication   3. Pregnancy with third trimester bleeding, antepartum   4.  Oligohydramnios at term  Plan: Discharge home for patient to pick up belongings and eat. Patient to return this evening for induction by foley bulb for oligohydramnios (TOLAC patient).  Labor precautions and fetal kick counts    Medication List    ASK your doctor about these medications       prenatal multivitamin Tabs  Take 1 tablet by mouth daily.     ranitidine 150 MG tablet  Commonly known as:  ZANTAC  Take 150 mg by mouth daily as needed. For acid reflux        Lonia Skinner, MD 05/27/2012 11:47 AM  I have seen this patient and agree with the above resident's note.  Discussed assessment and findings with Dr Penne Lash. Pt to return in 2 hours for IOL by Foley bulb.    LEFTWICH-KIRBY, LISA Certified Nurse-Midwife

## 2012-05-27 NOTE — H&P (Signed)
HPI: Holly [name] is a 32 y.o. year old G44P1011 female at [redacted]w[redacted]d weeks gestation by [place] who presents for  IOL. Previous pregnancy induction for Oligohydramnios and GHTN, requiring CS  Maternal Medical History:  Reason for admission: Nausea. IOL for oligohydramnios, GHTN    Fetal activity: Perceived fetal activity is normal.   Last perceived fetal movement was within the past hour.    Prenatal complications: PIH and oligohydramnios.   No pre-eclampsia.   Prenatal Complications - Diabetes: none.    OB History   Grav Para Term Preterm Abortions TAB SAB Ect Mult Living   3 1 1  1  1   1      Past Medical History  Diagnosis Date  . Pregnancy   . Pregnancy induced hypertension    Past Surgical History  Procedure Laterality Date  . No past surgeries    . C-sectionx1    . Cesarean section     Family History: family history includes Diabetes in her maternal grandfather; Hyperlipidemia in her father; Hypertension in her father; Kidney disease in her paternal uncle; and Lung cancer in her paternal grandmother.  There is no history of Colon cancer, and Esophageal cancer, and Stomach cancer, . Social History:  reports that she has never smoked. She has never used smokeless tobacco. She reports that she does not drink alcohol or use illicit drugs.   Prenatal Transfer Tool  Maternal Diabetes: No Genetic Screening: Declined, Too Late in care Maternal Ultrasounds/Referrals: Abnormal:  Findings:   Fetal Kidney Anomalies, Horseshoe Kidney  Fetal Ultrasounds or other Referrals:  None Maternal Substance Abuse:  No Significant Maternal Medications:  None Significant Maternal Lab Results:  Lab values include: Group B Strep negative Other Comments:  None  Review of Systems  Constitutional: Negative for fever.  Eyes: Negative for blurred vision and double vision.  Respiratory: Negative for cough.   Cardiovascular: Positive for leg swelling. Negative for chest pain.  Gastrointestinal: Negative  for nausea, vomiting and abdominal pain.  Genitourinary: Negative for dysuria.  Musculoskeletal: Negative for myalgias.  Neurological: Negative for dizziness and headaches.      Blood pressure 127/71, pulse 80, temperature 98.2 F (36.8 C), temperature source Oral, resp. rate 20, last menstrual period 08/13/2011. Maternal Exam:  Cervix: Cervix evaluated by digital exam.     Physical Exam  Constitutional: She appears well-developed and well-nourished. No distress.  HENT:  Head: Normocephalic.  Eyes: EOM are normal.  Cardiovascular: Normal rate and regular rhythm.   Respiratory: Effort normal and breath sounds normal.  GI: Soft.  Musculoskeletal: She exhibits edema (trace).  Skin: Skin is warm and dry.    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  Tawnya Crook

## 2012-05-27 NOTE — MAU Provider Note (Signed)
History     CSN: [id number]  Arrival date and time: 05/26/12 2252   None     Chief Complaint  Patient presents with  . Hypertension  . Headache   HPI  Holly [name] is a 32 y.o. G3P1011 at [redacted]w[redacted]d who presents today with a headache and blurry vision. She has a history of high blood pressure with her last pregnancy, and was worried about it due to these symptoms.   Past Medical History  Diagnosis Date  . Pregnancy   . Pregnancy induced hypertension     Past Surgical History  Procedure Laterality Date  . No past surgeries    . C-sectionx1    . Cesarean section      Family History  Problem Relation Age of Onset  . Colon cancer Neg Hx   . Esophageal cancer Neg Hx   . Stomach cancer Neg Hx   . Lung cancer Paternal Grandmother   . Diabetes Maternal Grandfather   . Kidney disease Paternal Uncle   . Hypertension Father   . Hyperlipidemia Father     History  Substance Use Topics  . Smoking status: Never Smoker   . Smokeless tobacco: Never Used  . Alcohol Use: No    Allergies:  Allergies  Allergen Reactions  . Naproxen Sodium     Chest burns     Prescriptions prior to admission  Medication Sig Dispense Refill  . Prenatal Vit-Fe Fumarate-FA (PRENATAL MULTIVITAMIN) TABS Take 1 tablet by mouth daily.      . ranitidine (ZANTAC) 150 MG tablet Take 150 mg by mouth daily as needed. For acid reflux        Review of Systems  Constitutional: Negative for fever.  Eyes: Positive for blurred vision.  Gastrointestinal: Negative for nausea, vomiting, abdominal pain, diarrhea and constipation.  Genitourinary: Negative for dysuria, urgency and frequency.  Musculoskeletal: Negative for myalgias.  Neurological: Positive for headaches.   Physical Exam   Blood pressure 141/83, pulse 93, temperature 97.8 F (36.6 C), resp. rate 20, height 5' 5.5" (1.664 m), weight 295 lb 9.6 oz (134.083 kg), last menstrual period 08/13/2011, SpO2 100.00%.  Physical Exam  Nursing note and  vitals reviewed. Constitutional: She is oriented to person, place, and time. She appears well-developed and well-nourished. No distress.  Cardiovascular: Normal rate.   Respiratory: Effort normal.  GI: Soft.  Neurological: She is alert and oriented to person, place, and time. She has normal reflexes.  No clonus  Skin: Skin is warm and dry.  Psychiatric: She has a normal mood and affect.   FHT: 140, moderate variability with 15x15 accels, no decels Toco: no UCS MAU Course  Procedures  Results for orders placed during the hospital encounter of 05/26/12 (from the past 24 hour(s))  URINALYSIS, ROUTINE W REFLEX MICROSCOPIC     Status: Abnormal   Collection Time    05/26/12 11:00 PM      Result Value Range   Color, Urine YELLOW  YELLOW   APPearance CLEAR  CLEAR   Specific Gravity, Urine >1.030 (*) 1.005 - 1.030   pH 6.0  5.0 - 8.0   Glucose, UA NEGATIVE  NEGATIVE mg/dL   Hgb urine dipstick TRACE (*) NEGATIVE   Bilirubin Urine NEGATIVE  NEGATIVE   Ketones, ur NEGATIVE  NEGATIVE mg/dL   Protein, ur NEGATIVE  NEGATIVE mg/dL   Urobilinogen, UA 0.2  0.0 - 1.0 mg/dL   Nitrite NEGATIVE  NEGATIVE   Leukocytes, UA TRACE (*) NEGATIVE  URINE MICROSCOPIC-ADD ON  Status: Abnormal   Collection Time    05/26/12 11:00 PM      Result Value Range   Squamous Epithelial / LPF FEW (*) RARE   WBC, UA 3-6  <3 WBC/hpf   RBC / HPF 3-6  <3 RBC/hpf   Bacteria, UA FEW (*) RARE  PROTEIN / CREATININE RATIO, URINE     Status: None   Collection Time    05/26/12 11:00 PM      Result Value Range   Creatinine, Urine 224.34     Total Protein, Urine 21.5     PROTEIN CREATININE RATIO 0.10  0.00 - 0.15  COMPREHENSIVE METABOLIC PANEL     Status: Abnormal   Collection Time    05/27/12 12:10 AM      Result Value Range   Sodium 130 (*) 135 - 145 mEq/L   Potassium 3.7  3.5 - 5.1 mEq/L   Chloride 100  96 - 112 mEq/L   CO2 20  19 - 32 mEq/L   Glucose, Bld 89  70 - 99 mg/dL   BUN 7  6 - 23 mg/dL   Creatinine,  Ser 1.61  0.50 - 1.10 mg/dL   Calcium 8.8  8.4 - 09.6 mg/dL   Total Protein 6.7  6.0 - 8.3 g/dL   Albumin 2.6 (*) 3.5 - 5.2 g/dL   AST 19  0 - 37 U/L   ALT 15  0 - 35 U/L   Alkaline Phosphatase 116  39 - 117 U/L   Total Bilirubin 0.3  0.3 - 1.2 mg/dL   GFR calc non Af Amer >90  >90 mL/min   GFR calc Af Amer >90  >90 mL/min  CBC     Status: Abnormal   Collection Time    05/27/12 12:10 AM      Result Value Range   WBC 8.6  4.0 - 10.5 K/uL   RBC 3.78 (*) 3.87 - 5.11 MIL/uL   Hemoglobin 10.3 (*) 12.0 - 15.0 g/dL   HCT 04.5 (*) 40.9 - 81.1 %   MCV 84.4  78.0 - 100.0 fL   MCH 27.2  26.0 - 34.0 pg   MCHC 32.3  30.0 - 36.0 g/dL   RDW 91.4  78.2 - 95.6 %   Platelets 336  150 - 400 K/uL    Assessment and Plan   1. Unspecified high-risk pregnancy   2. Previous cesarean delivery, antepartum condition or complication   3. Pregnancy with third trimester bleeding, antepartum   4. Gestational hypertension w/o significant proteinuria in 3rd trimester    Pre-eclampsia danger signs reviewed FU as scheduled in the clinic.   Tawnya Crook 05/27/2012, 12:38 AM

## 2012-05-27 NOTE — Progress Notes (Signed)
Patient here for lab appointment- but per lab all labs done last night.  Patient request to speak to nurse- states she wants to know plan- states was here in MAU last night and worried because she was delivered with previous pregnancy because blood pressure was elevated. Reviewed patient labs and informed her liver enzymes, platelet and protein all wnl and that blood pressure was ok . Discussed doctors will continue to monitor frequent labs, her blood pressure, etc and deliver if needed- next appt 05/29/12. Patient states feels dizzy today and request blood pressure checked. BP 140/95 which is higher than last night. Called Dr. Penne Lash with patient c/o dizziness , and Blood pressure- took patient to MAU for evaluation, report called to MAU nurses.

## 2012-05-28 ENCOUNTER — Encounter (HOSPITAL_COMMUNITY): Payer: Self-pay | Admitting: Anesthesiology

## 2012-05-28 ENCOUNTER — Encounter (HOSPITAL_COMMUNITY): Payer: Self-pay | Admitting: *Deleted

## 2012-05-28 ENCOUNTER — Inpatient Hospital Stay (HOSPITAL_COMMUNITY): Payer: Medicaid Other | Admitting: Anesthesiology

## 2012-05-28 DIAGNOSIS — O139 Gestational [pregnancy-induced] hypertension without significant proteinuria, unspecified trimester: Secondary | ICD-10-CM

## 2012-05-28 DIAGNOSIS — O34219 Maternal care for unspecified type scar from previous cesarean delivery: Secondary | ICD-10-CM

## 2012-05-28 DIAGNOSIS — O4100X0 Oligohydramnios, unspecified trimester, not applicable or unspecified: Secondary | ICD-10-CM

## 2012-05-28 LAB — TYPE AND SCREEN
ABO/RH(D): O POS
Antibody Screen: NEGATIVE

## 2012-05-28 LAB — CBC
HCT: 33.2 % — ABNORMAL LOW (ref 36.0–46.0)
Hemoglobin: 10.5 g/dL — ABNORMAL LOW (ref 12.0–15.0)
MCH: 26.7 pg (ref 26.0–34.0)
MCHC: 31.6 g/dL (ref 30.0–36.0)
MCHC: 32.6 g/dL (ref 30.0–36.0)
MCV: 84.7 fL (ref 78.0–100.0)
Platelets: 296 10*3/uL (ref 150–400)
RDW: 15.4 % (ref 11.5–15.5)
WBC: 16.8 10*3/uL — ABNORMAL HIGH (ref 4.0–10.5)

## 2012-05-28 LAB — PROTEIN / CREATININE RATIO, URINE: Creatinine, Urine: 170.59 mg/dL

## 2012-05-28 LAB — URINE CULTURE: Colony Count: 60000

## 2012-05-28 LAB — COMPREHENSIVE METABOLIC PANEL
AST: 21 U/L (ref 0–37)
Albumin: 2.5 g/dL — ABNORMAL LOW (ref 3.5–5.2)
Chloride: 102 mEq/L (ref 96–112)
Creatinine, Ser: 0.6 mg/dL (ref 0.50–1.10)
Potassium: 4.4 mEq/L (ref 3.5–5.1)
Total Bilirubin: 0.5 mg/dL (ref 0.3–1.2)
Total Protein: 6.4 g/dL (ref 6.0–8.3)

## 2012-05-28 LAB — RPR: RPR Ser Ql: NONREACTIVE

## 2012-05-28 MED ORDER — PRENATAL MULTIVITAMIN CH
1.0000 | ORAL_TABLET | Freq: Every day | ORAL | Status: DC
  Administered 2012-05-29 – 2012-05-30 (×2): 1 via ORAL
  Filled 2012-05-28 (×2): qty 1

## 2012-05-28 MED ORDER — DIPHENHYDRAMINE HCL 50 MG/ML IJ SOLN: 12.5000 mg | INTRAMUSCULAR | Status: DC | PRN

## 2012-05-28 MED ORDER — FERROUS SULFATE 325 (65 FE) MG PO TABS
325.0000 mg | ORAL_TABLET | Freq: Two times a day (BID) | ORAL | Status: DC
  Administered 2012-05-28 – 2012-05-30 (×4): 325 mg via ORAL
  Filled 2012-05-28 (×4): qty 1

## 2012-05-28 MED ORDER — EPHEDRINE 5 MG/ML INJ
10.0000 mg | INTRAVENOUS | Status: DC | PRN
  Filled 2012-05-28: qty 4

## 2012-05-28 MED ORDER — DIBUCAINE 1 % RE OINT: 1.0000 "application " | TOPICAL_OINTMENT | RECTAL | Status: DC | PRN

## 2012-05-28 MED ORDER — OXYCODONE-ACETAMINOPHEN 5-325 MG PO TABS
1.0000 | ORAL_TABLET | ORAL | Status: DC | PRN
  Administered 2012-05-28 – 2012-05-29 (×2): 1 via ORAL
  Administered 2012-05-29: 2 via ORAL
  Filled 2012-05-28: qty 2
  Filled 2012-05-28 (×2): qty 1

## 2012-05-28 MED ORDER — TERBUTALINE SULFATE 1 MG/ML IJ SOLN: 0.2500 mg | Freq: Once | INTRAMUSCULAR | Status: DC | PRN

## 2012-05-28 MED ORDER — LIDOCAINE HCL (PF) 1 % IJ SOLN
INTRAMUSCULAR | Status: DC | PRN
  Administered 2012-05-28 (×4): 4 mL

## 2012-05-28 MED ORDER — DIPHENHYDRAMINE HCL 25 MG PO CAPS: 25.0000 mg | ORAL_CAPSULE | Freq: Four times a day (QID) | ORAL | Status: DC | PRN

## 2012-05-28 MED ORDER — BISACODYL 10 MG RE SUPP: 10.0000 mg | Freq: Every day | RECTAL | Status: DC | PRN

## 2012-05-28 MED ORDER — OXYTOCIN 40 UNITS IN LACTATED RINGERS INFUSION - SIMPLE MED
1.0000 m[IU]/min | INTRAVENOUS | Status: DC
  Administered 2012-05-28: 2 m[IU]/min via INTRAVENOUS

## 2012-05-28 MED ORDER — SENNOSIDES-DOCUSATE SODIUM 8.6-50 MG PO TABS
2.0000 | ORAL_TABLET | Freq: Every day | ORAL | Status: DC
  Administered 2012-05-28 – 2012-05-29 (×2): 2 via ORAL

## 2012-05-28 MED ORDER — TETANUS-DIPHTH-ACELL PERTUSSIS 5-2.5-18.5 LF-MCG/0.5 IM SUSP
0.5000 mL | Freq: Once | INTRAMUSCULAR | Status: AC
  Administered 2012-05-29: 0.5 mL via INTRAMUSCULAR
  Filled 2012-05-28: qty 0.5

## 2012-05-28 MED ORDER — WITCH HAZEL-GLYCERIN EX PADS: 1.0000 "application " | MEDICATED_PAD | CUTANEOUS | Status: DC | PRN

## 2012-05-28 MED ORDER — LANOLIN HYDROUS EX OINT: TOPICAL_OINTMENT | CUTANEOUS | Status: DC | PRN

## 2012-05-28 MED ORDER — FENTANYL CITRATE 0.05 MG/ML IJ SOLN
100.0000 ug | INTRAMUSCULAR | Status: DC | PRN
  Administered 2012-05-28 (×4): 100 ug via INTRAVENOUS
  Filled 2012-05-28 (×4): qty 2

## 2012-05-28 MED ORDER — EPHEDRINE 5 MG/ML INJ: 10.0000 mg | INTRAVENOUS | Status: DC | PRN

## 2012-05-28 MED ORDER — MEASLES, MUMPS & RUBELLA VAC ~~LOC~~ INJ
0.5000 mL | INJECTION | Freq: Once | SUBCUTANEOUS | Status: DC
  Filled 2012-05-28: qty 0.5

## 2012-05-28 MED ORDER — SIMETHICONE 80 MG PO CHEW: 80.0000 mg | CHEWABLE_TABLET | ORAL | Status: DC | PRN

## 2012-05-28 MED ORDER — LACTATED RINGERS IV SOLN
500.0000 mL | Freq: Once | INTRAVENOUS | Status: AC
  Administered 2012-05-28: 500 mL via INTRAVENOUS

## 2012-05-28 MED ORDER — ONDANSETRON HCL 4 MG PO TABS: 4.0000 mg | ORAL_TABLET | ORAL | Status: DC | PRN

## 2012-05-28 MED ORDER — FENTANYL 2.5 MCG/ML BUPIVACAINE 1/10 % EPIDURAL INFUSION (WH - ANES)
14.0000 mL/h | INTRAMUSCULAR | Status: DC
  Administered 2012-05-28: 14 mL/h via EPIDURAL
  Filled 2012-05-28: qty 125

## 2012-05-28 MED ORDER — FLEET ENEMA 7-19 GM/118ML RE ENEM: 1.0000 | ENEMA | Freq: Every day | RECTAL | Status: DC | PRN

## 2012-05-28 MED ORDER — OXYTOCIN 40 UNITS IN LACTATED RINGERS INFUSION - SIMPLE MED: 62.5000 mL/h | INTRAVENOUS | Status: DC | PRN

## 2012-05-28 MED ORDER — PHENYLEPHRINE 40 MCG/ML (10ML) SYRINGE FOR IV PUSH (FOR BLOOD PRESSURE SUPPORT)
80.0000 ug | PREFILLED_SYRINGE | INTRAVENOUS | Status: DC | PRN
  Filled 2012-05-28: qty 5

## 2012-05-28 MED ORDER — PHENYLEPHRINE 40 MCG/ML (10ML) SYRINGE FOR IV PUSH (FOR BLOOD PRESSURE SUPPORT): 80.0000 ug | PREFILLED_SYRINGE | INTRAVENOUS | Status: DC | PRN

## 2012-05-28 MED ORDER — ONDANSETRON HCL 4 MG/2ML IJ SOLN: 4.0000 mg | INTRAMUSCULAR | Status: DC | PRN

## 2012-05-28 MED ORDER — BENZOCAINE-MENTHOL 20-0.5 % EX AERO
1.0000 "application " | INHALATION_SPRAY | CUTANEOUS | Status: DC | PRN
  Administered 2012-05-29: 1 via TOPICAL
  Filled 2012-05-28: qty 56

## 2012-05-28 MED ORDER — IBUPROFEN 600 MG PO TABS
600.0000 mg | ORAL_TABLET | Freq: Four times a day (QID) | ORAL | Status: DC
  Administered 2012-05-28 – 2012-05-30 (×8): 600 mg via ORAL
  Filled 2012-05-28 (×8): qty 1

## 2012-05-28 MED ORDER — ZOLPIDEM TARTRATE 5 MG PO TABS: 5.0000 mg | ORAL_TABLET | Freq: Every evening | ORAL | Status: DC | PRN

## 2012-05-28 NOTE — Progress Notes (Signed)
  Subjective: Pt in some pain at this point, foley bulb still in  Objective: BP 144/88  Pulse 85  Temp(Src) 96.1 F (35.6 C) (Oral)  Resp 20  LMP 08/13/2011      FHT:  FHR: 115 bpm, variability: moderate,  accelerations:  Present,  decelerations:  Absent UC:   none SVE:   Dilation: 1.5 Effacement (%): 40 Station: -3 Exam by:: [employer] CNM  Labs: Lab Results  Component Value Date   WBC 8.7 05/27/2012   HGB 10.0* 05/27/2012   HCT 31.0* 05/27/2012   MCV 84.0 05/27/2012   PLT 316 05/27/2012    Assessment / Plan: Induction of labor due to gestational hypertension and Oligo,  progressing well on pitocin  Labor: Progressing normally Preeclampsia:  N/A Fetal Wellbeing:  Category I Pain Control:  Fentanyl I/D:  n/a Anticipated MOD:  NSVD  Aunya Lemler R. Paulina Fusi, DO of [facility] 05/28/2012, 12:43 AM

## 2012-05-28 NOTE — Progress Notes (Signed)
  Subjective: Pt received more Fentanyl for pain, currently sleeping   Objective: BP 132/61  Pulse 86  Temp(Src) 98 F (36.7 C) (Oral)  Resp 18  LMP 08/13/2011      FHT:  FHR: 110 bpm, variability: moderate,  accelerations:  Present,  decelerations:  Absent UC:   none SVE:   Dilation: 1.5 Effacement (%): 40 Station: -3 Exam by:: [employer] CNM  Labs: Lab Results  Component Value Date   WBC 8.7 05/27/2012   HGB 10.0* 05/27/2012   HCT 31.0* 05/27/2012   MCV 84.0 05/27/2012   PLT 316 05/27/2012    Assessment / Plan: Induction of labor due to gestational hypertension and Oligo,  progressing well on pitocin, foley bulb in place   Labor: Progressing normally, starting Pitocin, foley bulb in place  Preeclampsia:  N/A Fetal Wellbeing:  Category I Pain Control:  Fentanyl I/D:  n/a Anticipated MOD:  NSVD  Holly Solivan R. Paulina Fusi, DO of [facility] 05/28/2012, 4:58 AM

## 2012-05-28 NOTE — Anesthesia Procedure Notes (Signed)
Epidural Patient location during procedure: OB Start time: 05/28/2012 8:14 AM  Staffing Performed by: anesthesiologist   Preanesthetic Checklist Completed: patient identified, site marked, surgical consent, pre-op evaluation, timeout performed, IV checked, risks and benefits discussed and monitors and equipment checked  Epidural Patient position: sitting Prep: site prepped and draped and DuraPrep Patient monitoring: continuous pulse ox and blood pressure Approach: midline Injection technique: LOR air  Needle:  Needle type: Tuohy  Needle gauge: 17 G Needle length: 9 cm and 9 Needle insertion depth: 9 cm Catheter type: closed end flexible Catheter size: 19 Gauge Catheter at skin depth: 15 cm Test dose: negative  Assessment Events: blood not aspirated, injection not painful, no injection resistance, negative IV test and no paresthesia  Additional Notes Discussed risk of headache, infection, bleeding, nerve injury and failed or incomplete block.  Patient voices understanding and wishes to proceed.  Epidural placed with mild difficulty due to body habitus and inability to feel landmarks.  No paresthesia.  Patient tolerated procedure well with no apparent complications.  Jasmine December, MDReason for block:procedure for pain

## 2012-05-28 NOTE — Progress Notes (Signed)
Patient ID: Holly [name], female   DOB: [birth year], 32 y.o.   MRN: [id number]   S:  Pt has epidural, very comfortable. Thinks water broke either last night when went to toilet or this am when she vomited and felt gush of fluid.  Foley bulb out.  OCeasar Mons Vitals:   05/28/12 0914 05/28/12 0919 05/28/12 0924 05/28/12 0932  BP:    123/77  Pulse: 91 90 95 91  Temp:      TempSrc:      Resp:    18   BP have been 120-130s/70-80s except earlier this morning prior to epidural: several 140s/80-90s.  Cervix: 6/100/-2, very soft and stretchy, anterior. Attempted AROM but not much membrane left. Very soft caput.  Fern positive - unknown time of rupture Fluid is pink/blood-tinged.  FHTs:  110s, moderate variability, accels present, repetitive variables, some to 90s, good recovery, now with some earlies Cat II Toco:  q 2-3 minutes, not picking up well   A/P 31 y.o. G3P1011 at [redacted]w[redacted]d with GHTN, oligohydramnios -  BP are controlled/mild. CMP, protein/creatinine ratio pending today, previously normal (05/26/12) -  Progressing well s/p foley bulb. Pitocin at 4, cervix 6 cm - IUPC placed - FHTs cat II, overall reassuring  Napoleon Form, MD 05/28/2012 10:05 AM

## 2012-05-28 NOTE — Progress Notes (Signed)
Patient had epidural cath upon admission.  L & D RN came and removed after review of PIH labs.

## 2012-05-28 NOTE — Progress Notes (Addendum)
Patient ID: Holly [name], female   DOB: [birth year], 32 y.o.   MRN: [id number]   S:  Pt feeling pressure with contractions  O:  Filed Vitals:   05/28/12 1002  BP: 129/78  Pulse: 81  Temp:   Resp: 18    Cervix:  9/100/0  FHTs:  Baseline 110, moderate variability,no accels, early and variable decels present.  MVUs:  <150 but not picking up well.  A/P Progressing well. Will reposition to get rid of cervix on right side. Anticipate SVD, will start pushing once complete. FSE placed  Napoleon Form

## 2012-05-28 NOTE — Progress Notes (Signed)
UR chart review completed.  

## 2012-05-28 NOTE — Anesthesia Postprocedure Evaluation (Signed)
  Anesthesia Post-op Note  Patient: Holly [name]  Procedure(s) Performed: * No procedures listed *  Patient Location: Mother/Baby  Anesthesia Type:Epidural  Level of Consciousness: awake, alert , oriented and patient cooperative  Airway and Oxygen Therapy: Patient Spontanous Breathing  Post-op Pain: mild  Post-op Assessment: Patient's Cardiovascular Status Stable, Respiratory Function Stable, Patent Airway, No signs of Nausea or vomiting, Adequate PO intake and Pain level controlled  Post-op Vital Signs: Reviewed and stable  Complications: No apparent anesthesia complications

## 2012-05-28 NOTE — Anesthesia Preprocedure Evaluation (Signed)
Anesthesia Evaluation  Patient identified by MRN, date of birth, ID band Patient awake    Reviewed: Allergy & Precautions, H&P , NPO status , Patient's Chart, lab work & pertinent test results, reviewed documented beta blocker date and time   History of Anesthesia Complications Negative for: history of anesthetic complications  Airway Mallampati: III TM Distance: >3 FB Neck ROM: full    Dental  (+) Teeth Intact   Pulmonary neg pulmonary ROS,  breath sounds clear to auscultation        Cardiovascular hypertension (PIH), Rhythm:regular Rate:Normal     Neuro/Psych negative neurological ROS  negative psych ROS   GI/Hepatic Neg liver ROS, GERD-  Medicated,  Endo/Other  Morbid obesity  Renal/GU negative Renal ROS  negative genitourinary   Musculoskeletal   Abdominal   Peds  Hematology  (+) anemia ,   Anesthesia Other Findings   Reproductive/Obstetrics (+) Pregnancy (TOLAC - h/o C/ S x1)                           Anesthesia Physical Anesthesia Plan  ASA: III  Anesthesia Plan: Epidural   Post-op Pain Management:    Induction:   Airway Management Planned:   Additional Equipment:   Intra-op Plan:   Post-operative Plan:   Informed Consent: I have reviewed the patients History and Physical, chart, labs and discussed the procedure including the risks, benefits and alternatives for the proposed anesthesia with the patient or authorized representative who has indicated his/her understanding and acceptance.     Plan Discussed with:   Anesthesia Plan Comments:         Anesthesia Quick Evaluation

## 2012-05-29 ENCOUNTER — Encounter: Payer: Medicaid Other | Admitting: Advanced Practice Midwife

## 2012-05-29 ENCOUNTER — Encounter: Payer: Self-pay | Admitting: Advanced Practice Midwife

## 2012-05-29 NOTE — Progress Notes (Signed)
Post Partum Day 1 Subjective: no complaints, up ad lib, voiding, tolerating PO, + flatus and minimal vaginal bleeding, blood pressure this AM is 145/89 with pressures yesterday afternoon from 122-134/79-88, protein/Cr .14 yesterday Objective: Blood pressure 145/89, pulse 80, temperature 98.4 F (36.9 C), temperature source Oral, resp. rate 18, last menstrual period 08/13/2011, unknown if currently breastfeeding.  Physical Exam:  General: alert, cooperative and no distress Lochia: appropriate Uterine Fundus: firm Incision: n/a DVT Evaluation: No evidence of DVT seen on physical exam. Negative Homan's sign. No cords or calf tenderness. No significant calf/ankle edema.   Recent Labs  05/28/12 0733 05/28/12 1348  HGB 10.5* 11.0*  HCT 33.2* 33.7*    Assessment/Plan: Plan for discharge tomorrow, Breastfeeding and Contraception (patient plans on getting IUD with her family physician), no circumcision   LOS: 2 days   Adela Glimpse 05/29/2012, 7:38 AM

## 2012-05-29 NOTE — Progress Notes (Signed)
I have seen and examined this patient and I agree with the above. Cam Hai 8:50 AM 05/29/2012

## 2012-05-29 NOTE — Clinical Social Work Maternal (Signed)
    Clinical Social Work Department PSYCHOSOCIAL ASSESSMENT - MATERNAL/CHILD 05/29/2012  Patient:  Holly [name],Holly [name]  Account Number:  [id number]  Admit Date:  05/27/2012  Marjo Bicker Name:   Holly [name]    Clinical Social Worker:  Nobie Putnam, LCSW   Date/Time:  05/29/2012 11:21 AM  Date Referred:  05/29/2012   Referral source  CN     Referred reason  Other - See comment   Other referral source:    I:  FAMILY / HOME ENVIRONMENT Child's legal guardian:  PARENT  Guardian - Name Guardian - Age Guardian - Address  [name] [address]; [place], [place] [address]  Samella Parr. [place] 25    Other household support members/support persons Name Relationship DOB  [employer] DAUGHTER 03/27/06   Other support:    II  PSYCHOSOCIAL DATA Information Source:  Patient Interview  Insurance claims handler Resources Employment:   Environmental manager resources:  Medicaid If Medicaid - County:  [place] Clinical biochemist  WIC  Section 8   School / Grade:   Maternity Care Coordinator / Child Services Coordination / Early Interventions:  Cultural issues impacting care:    III  STRENGTHS Strengths  Adequate Resources  Home prepared for Child (including basic supplies)  Supportive family/friends   Strength comment:    IV  RISK FACTORS AND CURRENT PROBLEMS Current Problem:  YES   Risk Factor & Current Problem Patient Issue Family Issue Risk Factor / Current Problem Comment  Other - See comment Y N NPNC    V  SOCIAL WORK ASSESSMENT CSW referral received to assess pt's reason for [facility].  Pt told CSW that she started [facility] in November before her care was transferred to high risk clinic.  As per chart review, there is not any documentation to support pt's statement.  CSW explained hospital drug testing policy & pt verbalized understanding.  She denies any illegal substance use.  UDS is negative, meconium results are pending.  Pt denies  previous CPS involvement.  She has all the necessary supplies for the infant.  She identified her parents, Peyton Najjar & Tarri Guilfoil, as her primary support system.  She is not sure if FOB will be involved.  Pt appeared to be bonding with infant and appropriate.  CSW will continue to monitor drug screen results and make a referral if needed.      VI SOCIAL WORK PLAN Social Work Plan  No Further Intervention Required / No Barriers to Discharge   Type of pt/family education:   If child protective services report - county:   If child protective services report - date:   Information/referral to community resources comment:   Other social work plan:

## 2012-05-30 MED ORDER — IBUPROFEN 600 MG PO TABS: 600.0000 mg | ORAL_TABLET | Freq: Four times a day (QID) | ORAL | Status: DC

## 2012-05-30 NOTE — Discharge Summary (Signed)
Holly [name] is a 32 y.o. year old G36P1011 female at [redacted]w[redacted]d weeks gestation by [place] who presented for IOL due to oligohydramnios and GHTN.  Pt with previous CS who desired VBAC, pt received epidural, and progressed well.  On 05/28/12 @ 1209 PM, pt delivered a viable female infant by VSD, position OA, Apgars 9/9, placenta delivered intact and spontaneously, 3 VC without complications, vaginal wall tear requiring 3.0 vicryl repair and EBL 350.  Pt would like to breast feed and unsure of BC at this point.    Obstetric Discharge Summary Reason for Admission: induction of labor Prenatal Procedures: none Intrapartum Procedures: spontaneous vaginal delivery Postpartum Procedures: none Complications-Operative and Postpartum: vaginal laceration Hemoglobin  Date Value Range Status  05/28/2012 11.0* 12.0 - 15.0 g/dL Final     HCT  Date Value Range Status  05/28/2012 33.7* 36.0 - 46.0 % Final    Physical Exam:  General: alert and cooperative Lochia: appropriate Uterine Fundus: firm Incision: N/A DVT Evaluation: No evidence of DVT seen on physical exam.  Discharge Diagnoses: Term Pregnancy-delivered  Discharge Information: Date: 05/30/2012 Activity: pelvic rest Diet: routine Medications: Ibuprofen Condition: stable Instructions: refer to practice specific booklet Discharge to: home   Newborn Data: Live born female  Birth Weight: 7 lb 6 oz (3345 g) APGAR: 9, 9  Home with mother.  [name], DO of [facility] 05/30/2012, 7:36 AM  I have seen and examined this patient and agree the above assessment. CRESENZO-DISHMAN,Garyson Stelly 05/30/2012 7:41 AM

## 2012-06-05 ENCOUNTER — Encounter (HOSPITAL_COMMUNITY): Payer: Self-pay | Admitting: *Deleted

## 2012-06-06 NOTE — H&P (Signed)
Attestation of Attending Supervision of Advanced Practitioner (CNM/NP): Evaluation and management procedures were performed by the Advanced Practitioner under my supervision and collaboration. I have reviewed the Advanced Practitioner's note and chart, and I agree with the management and plan.  LEGGETT,KELLY H. 11:53 PM

## 2012-06-06 NOTE — MAU Provider Note (Signed)
Attestation of Attending Supervision of Advanced Practitioner (CNM/NP): Evaluation and management procedures were performed by the Advanced Practitioner under my supervision and collaboration. I have reviewed the Advanced Practitioner's note and chart, and I agree with the management and plan.  Holly [name] H. 11:55 PM

## 2012-06-07 ENCOUNTER — Encounter (HOSPITAL_COMMUNITY): Payer: Self-pay

## 2012-06-07 ENCOUNTER — Inpatient Hospital Stay (HOSPITAL_COMMUNITY)
Admission: AD | Admit: 2012-06-07 | Discharge: 2012-06-07 | Disposition: A | Discharge status: Home / Self Care | Payer: Medicaid Other | Source: Ambulatory Visit | Attending: Obstetrics and Gynecology | Admitting: Obstetrics and Gynecology

## 2012-06-07 DIAGNOSIS — O2390 Unspecified genitourinary tract infection in pregnancy, unspecified trimester: Secondary | ICD-10-CM | POA: Insufficient documentation

## 2012-06-07 DIAGNOSIS — O165 Unspecified maternal hypertension, complicating the puerperium: Secondary | ICD-10-CM

## 2012-06-07 DIAGNOSIS — R030 Elevated blood-pressure reading, without diagnosis of hypertension: Secondary | ICD-10-CM

## 2012-06-07 DIAGNOSIS — O34219 Maternal care for unspecified type scar from previous cesarean delivery: Secondary | ICD-10-CM

## 2012-06-07 DIAGNOSIS — N949 Unspecified condition associated with female genital organs and menstrual cycle: Secondary | ICD-10-CM | POA: Insufficient documentation

## 2012-06-07 DIAGNOSIS — R300 Dysuria: Secondary | ICD-10-CM | POA: Insufficient documentation

## 2012-06-07 DIAGNOSIS — N390 Urinary tract infection, site not specified: Secondary | ICD-10-CM

## 2012-06-07 DIAGNOSIS — R51 Headache: Secondary | ICD-10-CM | POA: Insufficient documentation

## 2012-06-07 LAB — URINALYSIS, ROUTINE W REFLEX MICROSCOPIC
Glucose, UA: NEGATIVE mg/dL
Protein, ur: 30 mg/dL — AB
pH: 6 (ref 5.0–8.0)

## 2012-06-07 LAB — COMPREHENSIVE METABOLIC PANEL WITH GFR
ALT: 33 U/L (ref 0–35)
AST: 23 U/L (ref 0–37)
Albumin: 3.2 g/dL — ABNORMAL LOW (ref 3.5–5.2)
Alkaline Phosphatase: 106 U/L (ref 39–117)
BUN: 11 mg/dL (ref 6–23)
CO2: 25 meq/L (ref 19–32)
Calcium: 8.7 mg/dL (ref 8.4–10.5)
Chloride: 106 meq/L (ref 96–112)
Creatinine, Ser: 0.85 mg/dL (ref 0.50–1.10)
GFR calc Af Amer: >90 mL/min
GFR calc non Af Amer: 90 mL/min — ABNORMAL LOW
Glucose, Bld: 103 mg/dL — ABNORMAL HIGH (ref 70–99)
Potassium: 3.8 meq/L (ref 3.5–5.1)
Sodium: 140 meq/L (ref 135–145)
Total Bilirubin: 0.4 mg/dL (ref 0.3–1.2)
Total Protein: 6.6 g/dL (ref 6.0–8.3)

## 2012-06-07 LAB — CBC
HCT: 34.7 % — ABNORMAL LOW (ref 36.0–46.0)
Hemoglobin: 10.9 g/dL — ABNORMAL LOW (ref 12.0–15.0)
MCH: 26.7 pg (ref 26.0–34.0)
MCHC: 31.4 g/dL (ref 30.0–36.0)
MCV: 85 fL (ref 78.0–100.0)
Platelets: 308 K/uL (ref 150–400)
RBC: 4.08 MIL/uL (ref 3.87–5.11)
RDW: 15.5 % (ref 11.5–15.5)
WBC: 7.7 K/uL (ref 4.0–10.5)

## 2012-06-07 LAB — URINE MICROSCOPIC-ADD ON

## 2012-06-07 LAB — PROTEIN / CREATININE RATIO, URINE
Protein Creatinine Ratio: 0.44 — ABNORMAL HIGH (ref 0.00–0.15)
Total Protein, Urine: 53.3 mg/dL

## 2012-06-07 MED ORDER — CEPHALEXIN 500 MG PO CAPS: 500.0000 mg | ORAL_CAPSULE | Freq: Two times a day (BID) | ORAL | Status: DC

## 2012-06-07 MED ORDER — HYDROCHLOROTHIAZIDE 25 MG PO TABS: 12.5000 mg | ORAL_TABLET | Freq: Every day | ORAL | Status: DC

## 2012-06-07 MED ORDER — LABETALOL HCL 100 MG PO TABS
200.0000 mg | ORAL_TABLET | Freq: Once | ORAL | Status: AC
  Administered 2012-06-07: 200 mg via ORAL
  Filled 2012-06-07: qty 2

## 2012-06-07 MED ORDER — ACETAMINOPHEN 500 MG PO TABS: 1000.0000 mg | ORAL_TABLET | Freq: Once | ORAL | Status: DC

## 2012-06-07 NOTE — MAU Note (Signed)
SVD 2/11. When I pee it hurts really bad and a lot of pressure. Also pain L side of back. All this started about a wk ago. Also have a headache since yesterday

## 2012-06-07 NOTE — MAU Provider Note (Signed)
History     CSN: [id number]  Arrival date and time: 06/07/12 1851   None     Chief Complaint  Patient presents with  . Dysuria  . Back Pain  . Headache   HPI 32 y.o. [id number] s/p SVD on 05/28/12 with urinary pain/pressure/burning (pain in left flank), headache and elevated BP, pain in vagina around stitches. Patient had home health nurse check BP a week after delivery, states it was "borderline."  Headache started about 2 days ago, mild, not relieved by motrin. No vision changes or RUQ pain. No increased swelling legs or hands noted.   OB History   Grav Para Term Preterm Abortions TAB SAB Ect Mult Living   3 2 2  1  1   2       Past Medical History  Diagnosis Date  . Pregnancy   . Pregnancy induced hypertension     Past Surgical History  Procedure Laterality Date  . No past surgeries    . C-sectionx1    . Cesarean section      Family History  Problem Relation Age of Onset  . Colon cancer Neg Hx   . Esophageal cancer Neg Hx   . Stomach cancer Neg Hx   . Lung cancer Paternal Grandmother   . Diabetes Maternal Grandfather   . Kidney disease Paternal Uncle   . Hypertension Father   . Hyperlipidemia Father     History  Substance Use Topics  . Smoking status: Never Smoker   . Smokeless tobacco: Never Used  . Alcohol Use: No    Allergies:  Allergies  Allergen Reactions  . Naproxen Sodium     Chest burns     Prescriptions prior to admission  Medication Sig Dispense Refill  . ibuprofen (ADVIL,MOTRIN) 600 MG tablet Take 1 tablet (600 mg total) by mouth every 6 (six) hours.  60 tablet  0  . Prenatal Vit-Fe Fumarate-FA (PRENATAL MULTIVITAMIN) TABS Take 1 tablet by mouth daily.        Review of Systems  Constitutional: Negative for fever and chills.  Eyes: Negative for blurred vision, double vision and photophobia.  Respiratory: Negative for shortness of breath and wheezing.   Cardiovascular: Negative for chest pain and palpitations.  Gastrointestinal:  Negative for nausea, vomiting, diarrhea and constipation.  Genitourinary: Positive for dysuria and frequency.  Neurological: Positive for headaches. Negative for dizziness.   Physical Exam   Blood pressure 144/83, pulse 65, temperature 98.3 F (36.8 C), resp. rate 20, height 5\' 5"  (1.651 m), weight 122.38 kg (269 lb 12.8 oz), last menstrual period 08/13/2011, SpO2 99.00%, currently breastfeeding.  Filed Vitals:   06/07/12 1909 06/07/12 1934 06/07/12 2030  BP: 164/93 147/85 144/83  Pulse: 74 66 65  Temp: 98.3 F (36.8 C)    Resp: 20    Height: 5\' 5"  (1.651 m)    Weight: 122.38 kg (269 lb 12.8 oz)    SpO2: 99%       Physical Exam  Constitutional: She is oriented to person, place, and time. She appears well-developed.  Morbidly obese  HENT:  Head: Normocephalic and atraumatic.  Eyes: Conjunctivae and EOM are normal.  Neck: Normal range of motion. Neck supple.  Cardiovascular: Normal rate, regular rhythm and normal heart sounds.   Respiratory: Effort normal and breath sounds normal. No respiratory distress.  GI: Soft. Bowel sounds are normal. There is no tenderness. There is no rebound.  Obese. No CVA tenderness.  Genitourinary:  Vagina with periurethral stitches. Removed a few  that looked irritated. Healing well. No signs of infection.  Musculoskeletal: She exhibits no tenderness.  Mild pitting edema in lower extremities bilaterally  Neurological: She is alert and oriented to person, place, and time. She has normal reflexes.  Skin: Skin is warm and dry.  Psychiatric: She has a normal mood and affect.   Results for orders placed during the hospital encounter of 06/07/12 (from the past 24 hour(s))  URINALYSIS, ROUTINE W REFLEX MICROSCOPIC     Status: Abnormal   Collection Time    06/07/12  7:20 PM      Result Value Range   Color, Urine YELLOW  YELLOW   APPearance HAZY (*) CLEAR   Specific Gravity, Urine 1.020  1.005 - 1.030   pH 6.0  5.0 - 8.0   Glucose, UA NEGATIVE   NEGATIVE mg/dL   Hgb urine dipstick LARGE (*) NEGATIVE   Bilirubin Urine NEGATIVE  NEGATIVE   Ketones, ur NEGATIVE  NEGATIVE mg/dL   Protein, ur 30 (*) NEGATIVE mg/dL   Urobilinogen, UA 0.2  0.0 - 1.0 mg/dL   Nitrite POSITIVE (*) NEGATIVE   Leukocytes, UA MODERATE (*) NEGATIVE  PROTEIN / CREATININE RATIO, URINE     Status: Abnormal   Collection Time    06/07/12  7:20 PM      Result Value Range   Creatinine, Urine 122.06     Total Protein, Urine 53.3     PROTEIN CREATININE RATIO 0.44 (*) 0.00 - 0.15  URINE MICROSCOPIC-ADD ON     Status: Abnormal   Collection Time    06/07/12  7:20 PM      Result Value Range   Squamous Epithelial / LPF FEW (*) RARE   WBC, UA TOO NUMEROUS TO COUNT  <3 WBC/hpf   RBC / HPF TOO NUMEROUS TO COUNT  <3 RBC/hpf   Bacteria, UA MANY (*) RARE  CBC     Status: Abnormal   Collection Time    06/07/12  8:15 PM      Result Value Range   WBC 7.7  4.0 - 10.5 K/uL   RBC 4.08  3.87 - 5.11 MIL/uL   Hemoglobin 10.9 (*) 12.0 - 15.0 g/dL   HCT 16.1 (*) 09.6 - 04.5 %   MCV 85.0  78.0 - 100.0 fL   MCH 26.7  26.0 - 34.0 pg   MCHC 31.4  30.0 - 36.0 g/dL   RDW 40.9  81.1 - 91.4 %   Platelets 308  150 - 400 K/uL  COMPREHENSIVE METABOLIC PANEL     Status: Abnormal   Collection Time    06/07/12  8:15 PM      Result Value Range   Sodium 140  135 - 145 mEq/L   Potassium 3.8  3.5 - 5.1 mEq/L   Chloride 106  96 - 112 mEq/L   CO2 25  19 - 32 mEq/L   Glucose, Bld 103 (*) 70 - 99 mg/dL   BUN 11  6 - 23 mg/dL   Creatinine, Ser 7.82  0.50 - 1.10 mg/dL   Calcium 8.7  8.4 - 95.6 mg/dL   Total Protein 6.6  6.0 - 8.3 g/dL   Albumin 3.2 (*) 3.5 - 5.2 g/dL   AST 23  0 - 37 U/L   ALT 33  0 - 35 U/L   Alkaline Phosphatase 106  39 - 117 U/L   Total Bilirubin 0.4  0.3 - 1.2 mg/dL   GFR calc non Af Amer 90 (*) >90 mL/min  GFR calc Af Amer >90  >90 mL/min     MAU Course  Procedures   Assessment and Plan  32 y.o. [id number] with 1.  High BP and headache.  LFT, creatinine  normal. Elevated prot/creat ratio likely influenced by UTI. 200 mg labetalol given in MAU. Home on HCTZ. BP check in clinic on Monday. 2. UTI:  Keflex 3. Vaginal pain:  Sitz baths.  4.  F/U for PP visit in 6 weeks.  Napoleon Form, MD

## 2012-06-08 NOTE — MAU Provider Note (Signed)
Attestation of Attending Supervision of Advanced Practitioner: Evaluation and management procedures were performed by the PA/NP/CNM/OB Fellow under my supervision/collaboration. Chart reviewed and agree with management and plan. We discussed patient condition, prior to discharge, and presence of uti felt contributing to proteinuria. Will treat symptomatically , as outpatient.  Randell Teare V 06/08/2012 6:00 AM

## 2012-06-08 NOTE — Discharge Summary (Signed)
Attestation of Attending Supervision of Advanced Practitioner: Evaluation and management procedures were performed by the PA/NP/CNM/OB Fellow under my supervision/collaboration. Chart reviewed and agree with management and plan.  Holly [name] V 06/08/2012 5:59 AM

## 2012-06-10 ENCOUNTER — Ambulatory Visit: Payer: Medicaid Other | Admitting: Obstetrics and Gynecology

## 2012-06-10 VITALS — BP 125/77 | HR 89 | Ht 65.5 in | Wt 262.0 lb

## 2012-06-10 DIAGNOSIS — Z0130 Encounter for examination of blood pressure without abnormal findings: Secondary | ICD-10-CM

## 2012-06-10 LAB — URINE CULTURE: Colony Count: >=100000

## 2012-06-10 NOTE — Progress Notes (Signed)
Patient states that she increased her HCTZ from 12.5 daily to 25mg  daily. Reviewed chart with Dr. Jolayne Panther. Patient cleared to leave and aware of PP appointment on 06/29/12.

## 2012-06-27 ENCOUNTER — Ambulatory Visit (INDEPENDENT_AMBULATORY_CARE_PROVIDER_SITE_OTHER): Payer: Medicaid Other | Admitting: Advanced Practice Midwife

## 2012-06-27 ENCOUNTER — Encounter: Payer: Self-pay | Admitting: Advanced Practice Midwife

## 2012-06-27 NOTE — Patient Instructions (Signed)
Levonorgestrel intrauterine device (IUD) What is this medicine? LEVONORGESTREL IUD (LEE voe nor jes trel) is a contraceptive (birth control) device. It is used to prevent pregnancy and to treat heavy bleeding that occurs during your period. It can be used for up to 5 years. This medicine may be used for other purposes; ask your health care provider or pharmacist if you have questions. What should I tell my health care provider before I take this medicine? They need to know if you have any of these conditions: -abnormal Pap smear -cancer of the breast, uterus, or cervix -diabetes -endometritis -genital or pelvic infection now or in the past -have more than one sexual partner or your partner has more than one partner -heart disease -history of an ectopic or tubal pregnancy -immune system problems -IUD in place -liver disease or tumor -problems with blood clots or take blood-thinners -use intravenous drugs -uterus of unusual shape -vaginal bleeding that has not been explained -an unusual or allergic reaction to levonorgestrel, other hormones, silicone, or polyethylene, medicines, foods, dyes, or preservatives -pregnant or trying to get pregnant -breast-feeding How should I use this medicine? This device is placed inside the uterus by a health care professional. Talk to your pediatrician regarding the use of this medicine in children. Special care may be needed. Overdosage: If you think you have taken too much of this medicine contact a poison control center or emergency room at once. NOTE: This medicine is only for you. Do not share this medicine with others. What if I miss a dose? This does not apply. What may interact with this medicine? Do not take this medicine with any of the following medications: -amprenavir -bosentan -fosamprenavir This medicine may also interact with the following medications: -aprepitant -barbiturate medicines for inducing sleep or treating  seizures -bexarotene -griseofulvin -medicines to treat seizures like carbamazepine, ethotoin, felbamate, oxcarbazepine, phenytoin, topiramate -modafinil -pioglitazone -rifabutin -rifampin -rifapentine -some medicines to treat HIV infection like atazanavir, indinavir, lopinavir, nelfinavir, tipranavir, ritonavir -St. John's wort -warfarin This list may not describe all possible interactions. Give your health care provider a list of all the medicines, herbs, non-prescription drugs, or dietary supplements you use. Also tell them if you smoke, drink alcohol, or use illegal drugs. Some items may interact with your medicine. What should I watch for while using this medicine? Visit your doctor or health care professional for regular check ups. See your doctor if you or your partner has sexual contact with others, becomes HIV positive, or gets a sexual transmitted disease. This product does not protect you against HIV infection (AIDS) or other sexually transmitted diseases. You can check the placement of the IUD yourself by reaching up to the top of your vagina with clean fingers to feel the threads. Do not pull on the threads. It is a good habit to check placement after each menstrual period. Call your doctor right away if you feel more of the IUD than just the threads or if you cannot feel the threads at all. The IUD may come out by itself. You may become pregnant if the device comes out. If you notice that the IUD has come out use a backup birth control method like condoms and call your health care provider. Using tampons will not change the position of the IUD and are okay to use during your period. What side effects may I notice from receiving this medicine? Side effects that you should report to your doctor or health care professional as soon as possible: -allergic reactions   like skin rash, itching or hives, swelling of the face, lips, or tongue -fever, flu-like symptoms -genital sores -high  blood pressure -no menstrual period for 6 weeks during use -pain, swelling, warmth in the leg -pelvic pain or tenderness -severe or sudden headache -signs of pregnancy -stomach cramping -sudden shortness of breath -trouble with balance, talking, or walking -unusual vaginal bleeding, discharge -yellowing of the eyes or skin Side effects that usually do not require medical attention (report to your doctor or health care professional if they continue or are bothersome): -acne -breast pain -change in sex drive or performance -changes in weight -cramping, dizziness, or faintness while the device is being inserted -headache -irregular menstrual bleeding within first 3 to 6 months of use -nausea This list may not describe all possible side effects. Call your doctor for medical advice about side effects. You may report side effects to FDA at 1-800-FDA-1088. Where should I keep my medicine? This does not apply. NOTE: This sheet is a summary. It may not cover all possible information. If you have questions about this medicine, talk to your doctor, pharmacist, or health care provider.  2012, Elsevier/Gold Standard. (04/24/2008 6:39:08 PM)Intrauterine Device Insertion Most often, an intrauterine device (IUD) is inserted into the uterus to prevent pregnancy. There are 2 types of IUDs available:  Copper IUD. This type of IUD creates an environment that is not favorable to sperm survival. The mechanism of action of the copper IUD is not known for certain. It can stay in place for 10 years.  Hormone IUD. This type of IUD contains the hormone progestin (synthetic progesterone). The progestin thickens the cervical mucus and prevents sperm from entering the uterus, and it also thins the uterine lining. There is no evidence that the hormone IUD prevents implantation. The hormone IUD can stay in place for up to 5 years. An IUD is the most cost-effective birth control if left in place for the full duration.  It may be removed at any time. LET YOUR CAREGIVER KNOW ABOUT:  Sensitivity to metals.  Medicines taken including herbs, eyedrops, over-the-counter medicines, and creams.  Use of steroids (by mouth or creams).  Previous problems with anesthetics or numbing medicine.  Previous gynecological surgery.  History of blood clots or clotting disorders.  Possibility of pregnancy.  Menstrual irregularities.  Concerns regarding unusual vaginal discharge or odors.  Previous experience with an IUD.  Other health problems. RISKS AND COMPLICATIONS  Accidental puncture (perforation) of the uterus.  Accidental placement of the IUD either in the muscle layer of the uterus (myometrium) or outside the uterus. If this happen, the IUD can be found essentially floating around the bowels. When this happens, the IUD must be taken out surgically.  The IUD may fall out of the uterus (expulsion). This is more common in women who have recently had a child.   Pregnancy in the fallopian tube (ectopic). BEFORE THE PROCEDURE  Schedule the IUD insertion for when you will have your menstrual period or right after, to make sure you are not pregnant. Placement of the IUD is better tolerated shortly after a menstrual cycle.  You may need to take tests or be examined to make sure you are not pregnant.  You may be required to take a pregnancy test.  You may be required to get checked for sexually transmitted infections (STIs) prior to placement. Placing an IUD in someone who has an infection can make an infection worse.  You may be given a pain reliever to take 1  or 2 hours before the procedure.  An exam will be performed to determine the size and position of your uterus.  Ask your caregiver about changing or stopping your regular medicines. PROCEDURE   A tool (speculum) is placed in the vagina. This allows your caregiver to see the lower part of the uterus (cervix).  The cervix is prepped with a  medicine that lowers the risk of infection.  You may be given a medicine to numb each side of the cervix (intracervical or paracervical block). This is used to block and control any discomfort with insertion.  A tool (uterine sound) is inserted into the uterus to determine the length of the uterine cavity and the direction the uterus may be tilted.  A slim instrument (IUD inserter) is inserted through the cervical canal and into your uterus.  The IUD is placed in the uterine cavity and the insertion device is removed.  The nylon string that is attached to the IUD, and used for eventual IUD removal, is trimmed. It is trimmed so that it lays high in the vagina, just outside the cervix. AFTER THE PROCEDURE  You may have bleeding after the procedure. This is normal. It varies from light spotting for a few days to menstrual-like bleeding.  You may have mild cramping.  Practice checking the string coming out of the cervix to make sure the IUD remains in the uterus. If you cannot feel the string, you should schedule a "string check" with your caregiver.  If you had a hormone IUD inserted, expect that your period may be lighter or nonexistent within a year's time (though this is not always the case). There may be delayed fertility with the hormone IUD as a result of its progesterone effect. When you are ready to become pregnant, it is suggested to have the IUD removed up to 1 year in advance.  Yearly exams are advised. Document Released: 11/30/2010 Document Revised: 06/26/2011 Document Reviewed: 11/30/2010 [facility] Patient Information 2013 [place], [place].

## 2012-06-27 NOTE — Progress Notes (Unsigned)
Patient ID: Holly [name], female   DOB: [birth year], 32 y.o.   MRN: [id number] Subjective:     Holly [name] is a 32 y.o. female who presents for a postpartum visit. She is 4 weeks postpartum following a spontaneous vaginal delivery following induction of labor for oligohydramnios and GHTN. I have fully reviewed the prenatal and intrapartum course. The delivery was at 38 gestational weeks. Outcome: vaginal birth after cesarean (VBAC). Anesthesia: epidural. Postpartum course has been uneventful, she is still taking HCTZ and notes that she has only had hypertension associated with pregnancy. Baby's course has been doing well. Baby is feeding by breast with minimal bottle supplementation. Bleeding resolved 2 weeks ago, currently no bleeding. Bowel function is normal. Bladder function is normal. Patient is not sexually active. Contraception method is abstinence and planning on Mirena implantation within next 2 weeks. Postpartum depression screening: negative.  The following portions of the patient's history were reviewed and updated as appropriate: allergies, current medications, past family history, past medical history, past social history, past surgical history and problem list.  Normal pap in December with negative HPV.   Review of Systems Pertinent items are noted in HPI.   Objective:    BP 121/80  Pulse 90  Ht 5\' 5"  (1.651 m)  Wt 267 lb 11.2 oz (121.428 kg)  BMI 44.55 kg/m2  Breastfeeding? Yes  General:  alert, cooperative and no distress        Assessment:     4 week postpartum exam. Pap smear not done at today's visit.   Plan:    1. Contraception: abstinence and will return in 1-2 weeks for Mirena insertion 2. Discontinue HCTZ, will evaluate BP at next visit and determine need to resume treatment 3. Follow up in: 1-2 weeks for Mirena insertion or as needed.

## 2012-07-08 ENCOUNTER — Ambulatory Visit (INDEPENDENT_AMBULATORY_CARE_PROVIDER_SITE_OTHER): Payer: Self-pay | Admitting: Obstetrics and Gynecology

## 2012-07-08 ENCOUNTER — Encounter: Payer: Self-pay | Admitting: Obstetrics and Gynecology

## 2012-07-08 VITALS — BP 154/92 | HR 86 | Ht 65.0 in | Wt 271.5 lb

## 2012-07-08 DIAGNOSIS — I10 Essential (primary) hypertension: Secondary | ICD-10-CM

## 2012-07-08 DIAGNOSIS — Z30430 Encounter for insertion of intrauterine contraceptive device: Secondary | ICD-10-CM

## 2012-07-08 MED ORDER — LEVONORGESTREL 20 MCG/24HR IU IUD
INTRAUTERINE_SYSTEM | Freq: Once | INTRAUTERINE | Status: AC
  Administered 2012-07-08: 1 via INTRAUTERINE

## 2012-07-08 NOTE — Progress Notes (Signed)
Patient ID: Holly [name], female   DOB: February 14, 1981, 32 y.o.   MRN: [id number] IUD Procedure Note Patient identified, informed consent performed, signed copy in chart, time out was performed.  Urine pregnancy test negative.  Speculum placed in the vagina.  Cervix visualized.  Cleaned with Betadine x 2.  Grasped anteriorly with a single tooth tenaculum.  Uterus sounded to 7 cm.  Mirena IUD placed per manufacturer's recommendations.  Strings trimmed to 3 cm. Tenaculum was removed, good hemostasis noted.  Patient tolerated procedure well.   Patient given post procedure instructions and Mirena care card with expiration date.  Patient is asked to check IUD strings periodically and follow up in 4-6 weeks for IUD check.  Patient with persistent elevated BP and is not consistently taking HCTZ. Advised to take HCTZ as prescribed. Will refer to [facility] for further management of HTN

## 2012-07-10 ENCOUNTER — Encounter: Payer: Self-pay | Admitting: *Deleted

## 2012-07-16 ENCOUNTER — Ambulatory Visit: Payer: Medicaid Other | Admitting: Family Medicine

## 2012-08-24 ENCOUNTER — Emergency Department (HOSPITAL_COMMUNITY): Payer: Medicaid Other

## 2012-08-24 ENCOUNTER — Encounter (HOSPITAL_COMMUNITY): Payer: Self-pay | Admitting: Emergency Medicine

## 2012-08-24 ENCOUNTER — Emergency Department (HOSPITAL_COMMUNITY)
Admission: EM | Admit: 2012-08-24 | Discharge: 2012-08-24 | Disposition: A | Discharge status: Home / Self Care | Payer: Medicaid Other | Attending: Emergency Medicine | Admitting: Emergency Medicine

## 2012-08-24 ENCOUNTER — Ambulatory Visit: Payer: Self-pay

## 2012-08-24 ENCOUNTER — Other Ambulatory Visit: Payer: Self-pay

## 2012-08-24 DIAGNOSIS — R1032 Left lower quadrant pain: Secondary | ICD-10-CM | POA: Insufficient documentation

## 2012-08-24 DIAGNOSIS — K8020 Calculus of gallbladder without cholecystitis without obstruction: Secondary | ICD-10-CM | POA: Insufficient documentation

## 2012-08-24 DIAGNOSIS — Z3202 Encounter for pregnancy test, result negative: Secondary | ICD-10-CM | POA: Insufficient documentation

## 2012-08-24 DIAGNOSIS — Z79899 Other long term (current) drug therapy: Secondary | ICD-10-CM | POA: Insufficient documentation

## 2012-08-24 DIAGNOSIS — R112 Nausea with vomiting, unspecified: Secondary | ICD-10-CM

## 2012-08-24 DIAGNOSIS — R002 Palpitations: Secondary | ICD-10-CM | POA: Insufficient documentation

## 2012-08-24 DIAGNOSIS — R42 Dizziness and giddiness: Secondary | ICD-10-CM | POA: Insufficient documentation

## 2012-08-24 DIAGNOSIS — R109 Unspecified abdominal pain: Secondary | ICD-10-CM

## 2012-08-24 DIAGNOSIS — R630 Anorexia: Secondary | ICD-10-CM | POA: Insufficient documentation

## 2012-08-24 DIAGNOSIS — R197 Diarrhea, unspecified: Secondary | ICD-10-CM | POA: Insufficient documentation

## 2012-08-24 LAB — COMPREHENSIVE METABOLIC PANEL
Albumin: 3.7 g/dL (ref 3.5–5.2)
Alkaline Phosphatase: 81 U/L (ref 39–117)
BUN: 6 mg/dL (ref 6–23)
Calcium: 9.4 mg/dL (ref 8.4–10.5)
Creatinine, Ser: 0.65 mg/dL (ref 0.50–1.10)
Potassium: 3.6 mEq/L (ref 3.5–5.1)
Total Protein: 7.4 g/dL (ref 6.0–8.3)

## 2012-08-24 LAB — URINALYSIS, ROUTINE W REFLEX MICROSCOPIC
Glucose, UA: NEGATIVE mg/dL
Specific Gravity, Urine: 1.024 (ref 1.005–1.030)
pH: 6.5 (ref 5.0–8.0)

## 2012-08-24 LAB — CBC
HCT: 35.3 % — ABNORMAL LOW (ref 36.0–46.0)
MCH: 27.7 pg (ref 26.0–34.0)
MCHC: 33.1 g/dL (ref 30.0–36.0)
RDW: 15.1 % (ref 11.5–15.5)

## 2012-08-24 LAB — URINE MICROSCOPIC-ADD ON

## 2012-08-24 LAB — POCT PREGNANCY, URINE: Preg Test, Ur: NEGATIVE

## 2012-08-24 LAB — POCT I-STAT TROPONIN I: Troponin i, poc: 0 ng/mL (ref 0.00–0.08)

## 2012-08-24 MED ORDER — IOHEXOL 300 MG/ML  SOLN
50.0000 mL | Freq: Once | INTRAMUSCULAR | Status: AC | PRN
  Administered 2012-08-24: 50 mL via ORAL

## 2012-08-24 MED ORDER — HYDROMORPHONE HCL PF 1 MG/ML IJ SOLN
1.0000 mg | Freq: Once | INTRAMUSCULAR | Status: AC
  Administered 2012-08-24: 1 mg via INTRAVENOUS
  Filled 2012-08-24: qty 1

## 2012-08-24 MED ORDER — SODIUM CHLORIDE 0.9 % IV BOLUS (SEPSIS)
1000.0000 mL | Freq: Once | INTRAVENOUS | Status: AC
  Administered 2012-08-24: 1000 mL via INTRAVENOUS

## 2012-08-24 MED ORDER — HYDROCODONE-ACETAMINOPHEN 5-325 MG PO TABS: 1.0000 | ORAL_TABLET | ORAL | Status: DC | PRN

## 2012-08-24 MED ORDER — ONDANSETRON HCL 4 MG/2ML IJ SOLN
4.0000 mg | Freq: Once | INTRAMUSCULAR | Status: AC
  Administered 2012-08-24: 4 mg via INTRAVENOUS
  Filled 2012-08-24: qty 2

## 2012-08-24 MED ORDER — IOHEXOL 300 MG/ML  SOLN
100.0000 mL | Freq: Once | INTRAMUSCULAR | Status: AC | PRN
  Administered 2012-08-24: 100 mL via INTRAVENOUS

## 2012-08-24 MED ORDER — ONDANSETRON HCL 4 MG PO TABS: 4.0000 mg | ORAL_TABLET | Freq: Four times a day (QID) | ORAL | Status: DC

## 2012-08-24 NOTE — ED Notes (Signed)
Patinet reports that she has pain and fluttering in her chest at times

## 2012-08-24 NOTE — ED Provider Notes (Signed)
Medical screening examination/treatment/procedure(s) were performed by non-physician practitioner and as supervising physician I was immediately available for consultation/collaboration.   Legend Tumminello, MD 08/24/12 1937 

## 2012-08-24 NOTE — ED Notes (Signed)
Pt resting quietly. Pt has no complaints at this time. Denies n/v. VSS. Pt awaiting disposition.

## 2012-08-24 NOTE — ED Provider Notes (Signed)
History     CSN: [id number]  Arrival date & time 08/24/12  1442   First MD Initiated Contact with Patient 08/24/12 1517      Chief Complaint  Patient presents with  . Nausea  . Abdominal Pain    (Consider location/radiation/quality/duration/timing/severity/associated sxs/prior treatment) HPI Comments: 32 y/o female with no significant PMHx presents to the ED complaining of gradual onset LLQ abdominal pain x 3 days. Pain described as sharp, intermittent rated 8/10, with episodes lasting 30-40 minutes. Nothing in specific makes the pain come or go. She has not tried any alleviating factors. Admits to associated nausea, vomiting and diarrhea. Unable to keep food down, able to tolerate fluids. Denies hematochezia or melena, fever or chills. The day prior to symptom onset she developed palpitations while at work, however denies being anxious or stressed. Feels as if her blood pressure is "spiking" since not feeling well. History of pregnancy induced HTN, no longer present after giving birth this past February. She has not had a menstrual period since giving birth. Denies vaginal bleeding, discharge, urinary symptoms. No cardiac history.  Patient is a 32 y.o. female presenting with abdominal pain. The history is provided by the patient.  Abdominal Pain Associated symptoms: diarrhea, nausea and vomiting   Associated symptoms: no chest pain, no chills, no cough, no dysuria, no fever, no shortness of breath, no vaginal bleeding and no vaginal discharge     Past Medical History  Diagnosis Date  . Pregnancy   . Pregnancy induced hypertension     Past Surgical History  Procedure Laterality Date  . No past surgeries    . C-sectionx1    . Cesarean section      Family History  Problem Relation Age of Onset  . Colon cancer Neg Hx   . Esophageal cancer Neg Hx   . Stomach cancer Neg Hx   . Lung cancer Paternal Grandmother   . Diabetes Maternal Grandfather   . Kidney disease Paternal Uncle    . Hypertension Father   . Hyperlipidemia Father     History  Substance Use Topics  . Smoking status: Never Smoker   . Smokeless tobacco: Never Used  . Alcohol Use: No    OB History   Grav Para Term Preterm Abortions TAB SAB Ect Mult Living   3 2 2  1  1   2       Review of Systems  Constitutional: Positive for appetite change. Negative for fever and chills.  Eyes: Negative for visual disturbance.  Respiratory: Negative for cough, chest tightness, shortness of breath and wheezing.   Cardiovascular: Positive for palpitations. Negative for chest pain.  Gastrointestinal: Positive for nausea, vomiting, abdominal pain and diarrhea. Negative for blood in stool.  Genitourinary: Negative for dysuria, urgency, frequency, vaginal bleeding, vaginal discharge, difficulty urinating and pelvic pain.  Neurological: Positive for light-headedness. Negative for dizziness, weakness and headaches.  Psychiatric/Behavioral: Negative for confusion.  All other systems reviewed and are negative.    Allergies  Naproxen sodium  Home Medications   Current Outpatient Rx  Name  Route  Sig  Dispense  Refill  . hydrochlorothiazide (HYDRODIURIL) 25 MG tablet   Oral   Take 25 mg by mouth daily.         . Prenatal Vit-Fe Fumarate-FA (PRENATAL MULTIVITAMIN) TABS   Oral   Take 1 tablet by mouth daily.           BP 147/84  Pulse 83  Temp(Src) 99.8 F (37.7 C) (Oral)  Resp 18  Wt 270 lb (122.471 kg)  BMI 44.93 kg/m2  SpO2 100%  Physical Exam  Nursing note and vitals reviewed. Constitutional: She is oriented to person, place, and time. She appears well-developed. No distress.  Obese  HENT:  Head: Normocephalic and atraumatic.  Mouth/Throat: Oropharynx is clear and moist.  Eyes: Conjunctivae and EOM are normal. Pupils are equal, round, and reactive to light.  Neck: Normal range of motion. Neck supple.  Cardiovascular: Normal rate, regular rhythm, normal heart sounds and intact distal  pulses.   No extremity edema.  Pulmonary/Chest: Effort normal and breath sounds normal. No respiratory distress. She has no decreased breath sounds. She has no wheezes. She has no rhonchi. She has no rales. She exhibits no tenderness.  Abdominal: Soft. Normal appearance and bowel sounds are normal. She exhibits no distension and no mass. There is tenderness (moreso LLQ) in the right upper quadrant, epigastric area and left lower quadrant. There is guarding. There is no rigidity, no rebound, no tenderness at McBurney's point and negative Murphy's sign.  No peritoneal signs.  Musculoskeletal: Normal range of motion. She exhibits no edema.  Neurological: She is alert and oriented to person, place, and time.  Skin: Skin is warm and dry. She is not diaphoretic.  Psychiatric: She has a normal mood and affect. Her behavior is normal.    ED Course  Procedures (including critical care time)  Labs Reviewed  CBC - Abnormal; Notable for the following:    Hemoglobin 11.7 (*)    HCT 35.3 (*)    All other components within normal limits  COMPREHENSIVE METABOLIC PANEL - Abnormal; Notable for the following:    Glucose, Bld 146 (*)    All other components within normal limits  URINALYSIS, ROUTINE W REFLEX MICROSCOPIC - Abnormal; Notable for the following:    APPearance CLOUDY (*)    Leukocytes, UA MODERATE (*)    All other components within normal limits  URINE MICROSCOPIC-ADD ON  POCT PREGNANCY, URINE  POCT I-STAT TROPONIN I    Date: 08/24/2012  Rate: 67  Rhythm: normal sinus rhythm  QRS Axis: normal  Intervals: normal  ST/T Wave abnormalities: normal  Conduction Disutrbances:none  Narrative Interpretation: no stemi, no significant changes from EKG obtained on 10/04/2009  Old EKG Reviewed: unchanged   Dg Chest 2 View  08/24/2012  *RADIOLOGY REPORT*  Clinical Data: Chest pain  CHEST - 2 VIEW  Comparison: 01/29/2011  Findings: Cardiac leads overlie the chest.  Heart size is normal. Lung  volumes are low but clear.  No pleural effusion.  No acute osseous finding.  IMPRESSION: Low lung volumes without focal acute finding. If the patient's symptoms continue, consider PA and lateral chest radiographs obtained at full inspiration when the patient is clinically able.   Original Report Authenticated By: Christiana Pellant, M.D.    Ct Abdomen Pelvis W Contrast  08/24/2012  *RADIOLOGY REPORT*  Clinical Data: Nausea.  Left lower abdominal pain.  Recent pregnancy.  CT ABDOMEN AND PELVIS WITH CONTRAST  Technique:  Multidetector CT imaging of the abdomen and pelvis was performed following the standard protocol during bolus administration of intravenous contrast.  Contrast: 50mL OMNIPAQUE IOHEXOL 300 MG/ML  SOLN, OMNIPAQUE IOHEXOL 300 MG/ML  SOLN  Comparison: CT abdomen and pelvis 01/29/2011.  Findings: The lung bases are clear without focal nodule, mass, or airspace disease.  Heart size is normal.  No significant pleural or pericardial effusion is present.  There is diffuse fatty infiltration of the liver.  The  liver and spleen are otherwise within normal limits.  The stomach is mildly distended.  The duodenum and pancreas are within normal limits.  A single stone is noted in the gallbladder.  The common bile duct is normal.  There is no inflammatory change.  Adrenal glands are normal bilaterally.  The kidneys ureters are within normal limits.  The rectosigmoid colon is normal.  The remainder of the colon is unremarkable.  The appendix is visualized and within normal limits. The small bowel is normal.  An IUD is in place.  The uterus and adnexa are otherwise within normal limits.  No significant adenopathy or free fluid is present.  Bone windows are unremarkable.  IMPRESSION:  1.  Cholelithiasis without evidence for cholecystitis. 2.  Diffuse fatty infiltration of the liver. 3.  No acute or focal abnormality to explain the patient's symptoms.   Original Report Authenticated By: Marin Roberts, M.D.       1. Gallstones   2. Abdominal pain   3. Nausea and vomiting   4. Diarrhea       MDM  32 y/o female with LLQ, RUQ, epigastric abdominal pain, n/v/d. Possible diverticulitis. Obtaining CT abdomen/pelvis. Dilaudid for pain, zofran for nausea, IV fluids. Obtaining labs- cbc, cmp, troponin, u/a, urine preg. EKG unremarkable. 5:20 PM CT scan showing cholelithiasis without cholecystitis. No explanation for LLQ symptoms. Pain subsided with dilaudid, no longer nauseated. Repeat abdominal exam with marked clinical improvement. Discussed certain foods to avoid reguarding both symptoms and gallbladder. F/u with surgery as outpatient. Regarding palpitations, EKG unremarkable, troponin negative. No concern for cardiac origin. Patient in NAD. Stable for discharge. Close return precautions discussed. Patient states understanding of plan and is agreeable.        Trevor Mace, PA-C 08/24/12 1723  Trevor Mace, PA-C 08/24/12 1724

## 2012-08-24 NOTE — ED Notes (Signed)
Patient states that she has pain to her left lower abodiminal area. The patent reports that she also at times feels like her chest is fluttering, she has pain up into her left shoulder. The patient have N/v

## 2012-08-24 NOTE — ED Notes (Signed)
PA Albert at bedside. 

## 2012-08-27 ENCOUNTER — Emergency Department (HOSPITAL_COMMUNITY)
Admission: EM | Admit: 2012-08-27 | Discharge: 2012-08-27 | Disposition: A | Discharge status: Home / Self Care | Payer: Medicaid Other | Attending: Emergency Medicine | Admitting: Emergency Medicine

## 2012-08-27 ENCOUNTER — Emergency Department (HOSPITAL_COMMUNITY): Payer: Medicaid Other

## 2012-08-27 ENCOUNTER — Encounter (HOSPITAL_COMMUNITY): Payer: Self-pay | Admitting: *Deleted

## 2012-08-27 DIAGNOSIS — K8020 Calculus of gallbladder without cholecystitis without obstruction: Secondary | ICD-10-CM | POA: Insufficient documentation

## 2012-08-27 DIAGNOSIS — Z3202 Encounter for pregnancy test, result negative: Secondary | ICD-10-CM | POA: Insufficient documentation

## 2012-08-27 DIAGNOSIS — E669 Obesity, unspecified: Secondary | ICD-10-CM | POA: Insufficient documentation

## 2012-08-27 DIAGNOSIS — K8050 Calculus of bile duct without cholangitis or cholecystitis without obstruction: Secondary | ICD-10-CM

## 2012-08-27 DIAGNOSIS — R110 Nausea: Secondary | ICD-10-CM | POA: Insufficient documentation

## 2012-08-27 DIAGNOSIS — R630 Anorexia: Secondary | ICD-10-CM | POA: Insufficient documentation

## 2012-08-27 LAB — CBC WITH DIFFERENTIAL/PLATELET
Basophils Absolute: 0 10*3/uL (ref 0.0–0.1)
Basophils Relative: 0 % (ref 0–1)
Eosinophils Absolute: 0.1 10*3/uL (ref 0.0–0.7)
Eosinophils Relative: 1 % (ref 0–5)
HCT: 38.5 % (ref 36.0–46.0)
Hemoglobin: 12.6 g/dL (ref 12.0–15.0)
Lymphocytes Relative: 40 % (ref 12–46)
Lymphs Abs: 3.3 10*3/uL (ref 0.7–4.0)
MCH: 27.5 pg (ref 26.0–34.0)
MCHC: 32.7 g/dL (ref 30.0–36.0)
MCV: 83.9 fL (ref 78.0–100.0)
Monocytes Absolute: 0.5 10*3/uL (ref 0.1–1.0)
Monocytes Relative: 6 % (ref 3–12)
Neutro Abs: 4.4 10*3/uL (ref 1.7–7.7)
Neutrophils Relative %: 53 % (ref 43–77)
Platelets: 410 10*3/uL — ABNORMAL HIGH (ref 150–400)
RBC: 4.59 MIL/uL (ref 3.87–5.11)
RDW: 14.7 % (ref 11.5–15.5)
WBC: 8.3 10*3/uL (ref 4.0–10.5)

## 2012-08-27 LAB — COMPREHENSIVE METABOLIC PANEL
ALT: 22 U/L (ref 0–35)
AST: 17 U/L (ref 0–37)
Albumin: 3.9 g/dL (ref 3.5–5.2)
Alkaline Phosphatase: 81 U/L (ref 39–117)
BUN: 7 mg/dL (ref 6–23)
CO2: 24 mEq/L (ref 19–32)
Calcium: 9.2 mg/dL (ref 8.4–10.5)
Chloride: 104 mEq/L (ref 96–112)
Creatinine, Ser: 0.6 mg/dL (ref 0.50–1.10)
GFR calc Af Amer: >90 mL/min (ref >90)
GFR calc non Af Amer: >90 mL/min (ref >90)
Glucose, Bld: 84 mg/dL (ref 70–99)
Potassium: 4.1 mEq/L (ref 3.5–5.1)
Sodium: 137 mEq/L (ref 135–145)
Total Bilirubin: 0.4 mg/dL (ref 0.3–1.2)
Total Protein: 7.7 g/dL (ref 6.0–8.3)

## 2012-08-27 LAB — URINE MICROSCOPIC-ADD ON

## 2012-08-27 LAB — URINALYSIS, ROUTINE W REFLEX MICROSCOPIC
Bilirubin Urine: NEGATIVE
Glucose, UA: NEGATIVE mg/dL
Ketones, ur: NEGATIVE mg/dL
Nitrite: NEGATIVE
Protein, ur: NEGATIVE mg/dL
Specific Gravity, Urine: 1.025 (ref 1.005–1.030)
Urobilinogen, UA: 1 mg/dL (ref 0.0–1.0)
pH: 6 (ref 5.0–8.0)

## 2012-08-27 LAB — LIPASE, BLOOD: Lipase: 56 U/L (ref 11–59)

## 2012-08-27 MED ORDER — HYDROMORPHONE HCL PF 1 MG/ML IJ SOLN
1.0000 mg | Freq: Once | INTRAMUSCULAR | Status: AC
  Administered 2012-08-27: 1 mg via INTRAMUSCULAR
  Filled 2012-08-27 (×2): qty 1

## 2012-08-27 MED ORDER — HYDROCODONE-ACETAMINOPHEN 5-325 MG PO TABS: 1.0000 | ORAL_TABLET | ORAL | Status: DC | PRN

## 2012-08-27 MED ORDER — PROMETHAZINE HCL 25 MG/ML IJ SOLN
25.0000 mg | Freq: Four times a day (QID) | INTRAMUSCULAR | Status: DC | PRN
  Administered 2012-08-27: 25 mg via INTRAMUSCULAR
  Filled 2012-08-27: qty 1

## 2012-08-27 MED ORDER — PROMETHAZINE HCL 25 MG PO TABS: 25.0000 mg | ORAL_TABLET | Freq: Four times a day (QID) | ORAL | Status: DC | PRN

## 2012-08-27 NOTE — ED Notes (Signed)
Pt reports being seen recently and diagnosed with gallstones. Pt reports she made an appt with [facility] on May 28. Reports today feeling nauseated and not being able to eat. Also reports no relief with home pain medications.

## 2012-08-27 NOTE — ED Provider Notes (Signed)
History    32yF with abdominal pain. RUQ. Recent evaluation for same. Cholelithiasis on CT. Referred to surgery. Appointment for later this month. Today with increased pain, nausea and anorexia. Similar to previous symptoms. Worsened after eating. No radiation. No fever or chills. No urinary symptoms.    CSN: [id number]  Arrival date & time 08/27/12  1658   First MD Initiated Contact with Patient 08/27/12 1922      Chief Complaint  Patient presents with  . Abdominal Pain    (Consider location/radiation/quality/duration/timing/severity/associated sxs/prior treatment) HPI  Past Medical History  Diagnosis Date  . Pregnancy   . Pregnancy induced hypertension     Past Surgical History  Procedure Laterality Date  . No past surgeries    . C-sectionx1    . Cesarean section      Family History  Problem Relation Age of Onset  . Colon cancer Neg Hx   . Esophageal cancer Neg Hx   . Stomach cancer Neg Hx   . Lung cancer Paternal Grandmother   . Diabetes Maternal Grandfather   . Kidney disease Paternal Uncle   . Hypertension Father   . Hyperlipidemia Father     History  Substance Use Topics  . Smoking status: Never Smoker   . Smokeless tobacco: Never Used  . Alcohol Use: No    OB History   Grav Para Term Preterm Abortions TAB SAB Ect Mult Living   3 2 2  1  1   2       Review of Systems  All systems reviewed and negative, other than as noted in HPI.   Allergies  Naproxen sodium  Home Medications   Current Outpatient Rx  Name  Route  Sig  Dispense  Refill  . HYDROcodone-acetaminophen (NORCO/VICODIN) 5-325 MG per tablet   Oral   Take 1-2 tablets by mouth every 4 (four) hours as needed for pain.   15 tablet   0   . ondansetron (ZOFRAN) 4 MG tablet   Oral   Take 1 tablet (4 mg total) by mouth every 6 (six) hours.   12 tablet   0   . Prenatal Vit-Fe Fumarate-FA (PRENATAL MULTIVITAMIN) TABS   Oral   Take 1 tablet by mouth daily.           BP 148/85   Pulse 77  Temp(Src) 98.7 F (37.1 C) (Oral)  Resp 18  SpO2 99%  Physical Exam  Nursing note and vitals reviewed. Constitutional: She appears well-developed and well-nourished. No distress.  Laying in bed. NAD. Obese.   HENT:  Head: Normocephalic and atraumatic.  Eyes: Conjunctivae are normal. Right eye exhibits no discharge. Left eye exhibits no discharge.  Neck: Neck supple.  Cardiovascular: Normal rate, regular rhythm and normal heart sounds.  Exam reveals no gallop and no friction rub.   No murmur heard. Pulmonary/Chest: Effort normal and breath sounds normal. No respiratory distress.  Abdominal: Soft. She exhibits no distension. There is tenderness.  RUQ and epigastric tenderness. No rebound/gaurding. No distension.  Musculoskeletal: She exhibits no edema and no tenderness.  Neurological: She is alert.  Skin: Skin is warm and dry.  Psychiatric: She has a normal mood and affect. Her behavior is normal. Thought content normal.    ED Course  Procedures (including critical care time)  Labs Reviewed  CBC WITH DIFFERENTIAL - Abnormal; Notable for the following:    Platelets 410 (*)    All other components within normal limits  URINALYSIS, ROUTINE W REFLEX MICROSCOPIC - Abnormal;  Notable for the following:    Hgb urine dipstick MODERATE (*)    Leukocytes, UA SMALL (*)    All other components within normal limits  COMPREHENSIVE METABOLIC PANEL  LIPASE, BLOOD  URINE MICROSCOPIC-ADD ON  POCT PREGNANCY, URINE   US Abdomen Complete  08/27/2012  *RADIOLOGY REPORT*  Clinical Data:  Abdominal pain.  COMPLETE ABDOMINAL ULTRASOUND  Comparison:  08/24/2012  Findings:  Gallbladder:  Multiple small mobile gallstones within the gallbladder, the largest 6 mm.  No wall thickening.  Negative sonographic Murphy's.  Common bile duct:   Normal caliber, 4 mm.  Liver:  Increased echotexture compatible with fatty infiltration. No focal abnormality.  IVC:  Appears normal.  Pancreas:  No focal  abnormality seen.  Spleen:  Within normal limits in size and echotexture.  Right Kidney:   Normal in size and parenchymal echogenicity.  No evidence of mass or hydronephrosis.  Left Kidney:  Normal in size and parenchymal echogenicity.  No evidence of mass or hydronephrosis.  Abdominal aorta:  No aneurysm identified.  IMPRESSION: Cholelithiasis.  No sonographic evidence of acute cholecystitis.  Fatty liver.   Original Report Authenticated By: Charlett Nose, M.D.      1. Biliary colic       MDM  32 year old female with abdominal pain. Symptomatology consistent with biliary colic. Ultrasound confirms the findings noted on recent CT. No ultrasound evidence of cholecystitis. LFTs and bilirubin are normal. Lipase is normal. Symptoms improved. Patient orally has surgical appointment scheduled. We'll discharge with prescription for additional pain medication. Return precautions discussed. Outpatient surgical followup otherwise.        Raeford Razor, MD 08/29/12 [phone]

## 2012-08-28 LAB — POCT PREGNANCY, URINE: Preg Test, Ur: NEGATIVE

## 2012-08-29 ENCOUNTER — Encounter (HOSPITAL_COMMUNITY): Payer: Self-pay | Admitting: Pharmacy Technician

## 2012-08-29 ENCOUNTER — Ambulatory Visit (INDEPENDENT_AMBULATORY_CARE_PROVIDER_SITE_OTHER): Payer: Medicaid Other | Admitting: Surgery

## 2012-08-29 ENCOUNTER — Encounter (INDEPENDENT_AMBULATORY_CARE_PROVIDER_SITE_OTHER): Payer: Self-pay | Admitting: Surgery

## 2012-08-29 ENCOUNTER — Encounter (HOSPITAL_COMMUNITY): Payer: Self-pay | Admitting: *Deleted

## 2012-08-29 VITALS — BP 144/92 | HR 88 | Temp 99.2°F | Resp 18 | Ht 65.0 in | Wt 272.0 lb

## 2012-08-29 DIAGNOSIS — K8020 Calculus of gallbladder without cholecystitis without obstruction: Secondary | ICD-10-CM

## 2012-08-29 DIAGNOSIS — Z9049 Acquired absence of other specified parts of digestive tract: Secondary | ICD-10-CM | POA: Insufficient documentation

## 2012-08-29 NOTE — Progress Notes (Signed)
Patient ID: Holly [name], female   DOB: [birth year], 32 y.o.   MRN: [id number]  Chief Complaint  Patient presents with  . Other    cholelithiasis    HPI Holly [name] is [name] 32 y.o. female.   HPI This is [name] pleasant female who presented to the emergency department last week for epigastric and left upper quadrant abdominal pain. She initially had [name] CAT scan showing cholelithiasis and no other abnormalities. She had return to the ER for similar symptoms and is now on Vicodin and Phenergan. She reports nausea and vomiting after fatty meals as well as loose bowel movements. She has had no previous symptoms during her previous pregnancies. She is several months postpartum. She is otherwise doing well. The pain is moderate and sharp in intensity and mostly in the epigastric area. She denies fevers Past Medical History  Diagnosis Date  . Pregnancy   . Pregnancy induced hypertension     Past Surgical History  Procedure Laterality Date  . No past surgeries    . C-sectionx1    . Cesarean section      Family History  Problem Relation Age of Onset  . Colon cancer Neg Hx   . Esophageal cancer Neg Hx   . Stomach cancer Neg Hx   . Lung cancer Paternal Grandmother   . Diabetes Maternal Grandfather   . Kidney disease Paternal Uncle   . Hypertension Father   . Hyperlipidemia Father     Social History History  Substance Use Topics  . Smoking status: Never Smoker   . Smokeless tobacco: Never Used  . Alcohol Use: No    Allergies  Allergen Reactions  . Naproxen Sodium     Chest burns     Current Outpatient Prescriptions  Medication Sig Dispense Refill  . HYDROcodone-acetaminophen (NORCO/VICODIN) 5-325 MG per tablet Take 1-2 tablets by mouth every 4 (four) hours as needed for pain.  15 tablet  0  . HYDROcodone-acetaminophen (NORCO/VICODIN) 5-325 MG per tablet Take 1-2 tablets by mouth every 4 (four) hours as needed for pain.  30 tablet  0  . ondansetron (ZOFRAN) 4 MG tablet Take 1  tablet (4 mg total) by mouth every 6 (six) hours.  12 tablet  0  . Prenatal Vit-Fe Fumarate-FA (PRENATAL MULTIVITAMIN) TABS Take 1 tablet by mouth daily.      . promethazine (PHENERGAN) 25 MG tablet Take 1 tablet (25 mg total) by mouth every 6 (six) hours as needed for nausea.  30 tablet  0   No current facility-administered medications for this visit.    Review of Systems Review of Systems  Constitutional: Negative for fever, chills and unexpected weight change.  HENT: Negative for hearing loss, congestion, sore throat, trouble swallowing and voice change.   Eyes: Negative for visual disturbance.  Respiratory: Negative for cough and wheezing.   Cardiovascular: Negative for chest pain, palpitations and leg swelling.  Gastrointestinal: Positive for nausea, vomiting, abdominal pain and diarrhea. Negative for constipation, blood in stool, abdominal distention and anal bleeding.  Genitourinary: Negative for hematuria, vaginal bleeding and difficulty urinating.  Musculoskeletal: Negative for arthralgias.  Skin: Negative for rash and wound.  Neurological: Negative for seizures, syncope and headaches.  Hematological: Negative for adenopathy. Does not bruise/bleed easily.  Psychiatric/Behavioral: Negative for confusion.    Blood pressure 144/92, pulse 88, temperature 99.2 F (37.3 C), resp. rate 18, height 5\' 5"  (1.651 m), weight 272 lb (123.378 kg).  Physical Exam Physical Exam  Constitutional: She is oriented to  person, place, and time. She appears well-nourished. No distress.  obese  HENT:  Head: Normocephalic and atraumatic.  Right Ear: External ear normal.  Left Ear: External ear normal.  Nose: Nose normal.  Mouth/Throat: Oropharynx is clear and moist. No oropharyngeal exudate.  Eyes: Conjunctivae are normal. Pupils are equal, round, and reactive to light. Right eye exhibits no discharge. Left eye exhibits no discharge. No scleral icterus.  Neck: Normal range of motion. Neck supple.  No tracheal deviation present. No thyromegaly present.  Cardiovascular: Normal rate, regular rhythm, normal heart sounds and intact distal pulses.   No murmur heard. Pulmonary/Chest: Effort normal. No respiratory distress. She has no wheezes.  Abdominal: Soft. Bowel sounds are normal. She exhibits no distension. There is tenderness. There is guarding.  There is tenderness with guarding in the epigastrium and bilateral upper quadrants but mostly the right upper quadrant  Musculoskeletal: Normal range of motion. She exhibits no edema and no tenderness.  Lymphadenopathy:    She has no cervical adenopathy.  Neurological: She is alert and oriented to person, place, and time.  Skin: Skin is warm and dry. No rash noted. She is not diaphoretic. No erythema.  Psychiatric: Her behavior is normal. Judgment normal.    Data Reviewed I have reviewed her CAT scan showing cholelithiasis. There are no other abnormalities. Liver function tests are normal  Assessment    Symptomatic cholelithiasis     Plan    Laparoscopic cholecystectomy with possible cholangiogram is recommended. I discussed this with her in detail and gave her literature regarding the surgery. I discussed the risks which includes but is not limited to bleeding, infection, bile duct injury, bile leak, injury to surrounding structures, the need to convert to an open procedure, the chance this may not resolve her symptoms, etc. I also discussed postoperative recovery. Surgery will be scheduled urgently        Holly [name] 08/29/2012, 2:29 PM

## 2012-08-29 NOTE — H&P (Signed)
Patient ID: Holly [name], female DOB: 1980/08/28, 32 y.o. MRN: [id number]  Chief Complaint   Patient presents with   .  Other     cholelithiasis   HPI  Holly [name] is a 32 y.o. female.  HPI  This is a pleasant female who presented to the emergency department last week for epigastric and left upper quadrant abdominal pain. She initially had a CAT scan showing cholelithiasis and no other abnormalities. She had return to the ER for similar symptoms and is now on Vicodin and Phenergan. She reports nausea and vomiting after fatty meals as well as loose bowel movements. She has had no previous symptoms during her previous pregnancies. She is several months postpartum. She is otherwise doing well. The pain is moderate and sharp in intensity and mostly in the epigastric area. She denies fevers  Past Medical History   Diagnosis  Date   .  Pregnancy    .  Pregnancy induced hypertension     Past Surgical History   Procedure  Laterality  Date   .  No past surgeries     .  C-sectionx1     .  Cesarean section      Family History   Problem  Relation  Age of Onset   .  Colon cancer  Neg Hx    .  Esophageal cancer  Neg Hx    .  Stomach cancer  Neg Hx    .  Lung cancer  Paternal Grandmother    .  Diabetes  Maternal Grandfather    .  Kidney disease  Paternal Uncle    .  Hypertension  Father    .  Hyperlipidemia  Father    Social History  History   Substance Use Topics   .  Smoking status:  Never Smoker   .  Smokeless tobacco:  Never Used   .  Alcohol Use:  No    Allergies   Allergen  Reactions   .  Naproxen Sodium      Chest burns    Current Outpatient Prescriptions   Medication  Sig  Dispense  Refill   .  HYDROcodone-acetaminophen (NORCO/VICODIN) 5-325 MG per tablet  Take 1-2 tablets by mouth every 4 (four) hours as needed for pain.  15 tablet  0   .  HYDROcodone-acetaminophen (NORCO/VICODIN) 5-325 MG per tablet  Take 1-2 tablets by mouth every 4 (four) hours as needed for pain.  30  tablet  0   .  ondansetron (ZOFRAN) 4 MG tablet  Take 1 tablet (4 mg total) by mouth every 6 (six) hours.  12 tablet  0   .  Prenatal Vit-Fe Fumarate-FA (PRENATAL MULTIVITAMIN) TABS  Take 1 tablet by mouth daily.     .  promethazine (PHENERGAN) 25 MG tablet  Take 1 tablet (25 mg total) by mouth every 6 (six) hours as needed for nausea.  30 tablet  0    No current facility-administered medications for this visit.   Review of Systems  Review of Systems  Constitutional: Negative for fever, chills and unexpected weight change.  HENT: Negative for hearing loss, congestion, sore throat, trouble swallowing and voice change.  Eyes: Negative for visual disturbance.  Respiratory: Negative for cough and wheezing.  Cardiovascular: Negative for chest pain, palpitations and leg swelling.  Gastrointestinal: Positive for nausea, vomiting, abdominal pain and diarrhea. Negative for constipation, blood in stool, abdominal distention and anal bleeding.  Genitourinary: Negative for hematuria, vaginal bleeding and difficulty  urinating.  Musculoskeletal: Negative for arthralgias.  Skin: Negative for rash and wound.  Neurological: Negative for seizures, syncope and headaches.  Hematological: Negative for adenopathy. Does not bruise/bleed easily.  Psychiatric/Behavioral: Negative for confusion.  Blood pressure 144/92, pulse 88, temperature 99.2 F (37.3 C), resp. rate 18, height 5\' 5"  (1.651 m), weight 272 lb (123.378 kg).  Physical Exam  Physical Exam  Constitutional: She is oriented to person, place, and time. She appears well-nourished. No distress.  obese  HENT:  Head: Normocephalic and atraumatic.  Right Ear: External ear normal.  Left Ear: External ear normal.  Nose: Nose normal.  Mouth/Throat: Oropharynx is clear and moist. No oropharyngeal exudate.  Eyes: Conjunctivae are normal. Pupils are equal, round, and reactive to light. Right eye exhibits no discharge. Left eye exhibits no discharge. No scleral  icterus.  Neck: Normal range of motion. Neck supple. No tracheal deviation present. No thyromegaly present.  Cardiovascular: Normal rate, regular rhythm, normal heart sounds and intact distal pulses.  No murmur heard.  Pulmonary/Chest: Effort normal. No respiratory distress. She has no wheezes.  Abdominal: Soft. Bowel sounds are normal. She exhibits no distension. There is tenderness. There is guarding.  There is tenderness with guarding in the epigastrium and bilateral upper quadrants but mostly the right upper quadrant  Musculoskeletal: Normal range of motion. She exhibits no edema and no tenderness.  Lymphadenopathy:  She has no cervical adenopathy.  Neurological: She is alert and oriented to person, place, and time.  Skin: Skin is warm and dry. No rash noted. She is not diaphoretic. No erythema.  Psychiatric: Her behavior is normal. Judgment normal.  Data Reviewed  I have reviewed her CAT scan showing cholelithiasis. There are no other abnormalities. Liver function tests are normal  Assessment  Symptomatic cholelithiasis  Plan  Laparoscopic cholecystectomy with possible cholangiogram is recommended. I discussed this with her in detail and gave her literature regarding the surgery. I discussed the risks which includes but is not limited to bleeding, infection, bile duct injury, bile leak, injury to surrounding structures, the need to convert to an open procedure, the chance this may not resolve her symptoms, etc. I also discussed postoperative recovery. Surgery will be scheduled urgently

## 2012-08-30 ENCOUNTER — Encounter (HOSPITAL_COMMUNITY): Payer: Self-pay | Admitting: Anesthesiology

## 2012-08-30 ENCOUNTER — Ambulatory Visit (HOSPITAL_COMMUNITY)
Admission: RE | Admit: 2012-08-30 | Discharge: 2012-08-30 | Disposition: A | Discharge status: Home / Self Care | Payer: Medicaid Other | Source: Ambulatory Visit | Attending: Surgery | Admitting: Surgery

## 2012-08-30 ENCOUNTER — Encounter (HOSPITAL_COMMUNITY)
Admission: RE | Disposition: A | Discharge status: Home / Self Care | Payer: Self-pay | Source: Ambulatory Visit | Attending: Surgery

## 2012-08-30 ENCOUNTER — Encounter (HOSPITAL_COMMUNITY): Payer: Self-pay | Admitting: *Deleted

## 2012-08-30 ENCOUNTER — Ambulatory Visit (HOSPITAL_COMMUNITY): Payer: Medicaid Other | Admitting: Anesthesiology

## 2012-08-30 DIAGNOSIS — K8010 Calculus of gallbladder with chronic cholecystitis without obstruction: Secondary | ICD-10-CM | POA: Insufficient documentation

## 2012-08-30 DIAGNOSIS — K824 Cholesterolosis of gallbladder: Secondary | ICD-10-CM

## 2012-08-30 HISTORY — DX: Other seasonal allergic rhinitis: J30.2

## 2012-08-30 HISTORY — PX: CHOLECYSTECTOMY: SHX55

## 2012-08-30 HISTORY — DX: Dermatitis, unspecified: L30.9

## 2012-08-30 LAB — SURGICAL PCR SCREEN
MRSA, PCR: NEGATIVE
Staphylococcus aureus: NEGATIVE

## 2012-08-30 LAB — CBC
HCT: 38 % (ref 36.0–46.0)
MCHC: 33.7 g/dL (ref 30.0–36.0)
RDW: 15.1 % (ref 11.5–15.5)
WBC: 7.5 10*3/uL (ref 4.0–10.5)

## 2012-08-30 LAB — HCG, SERUM, QUALITATIVE: Preg, Serum: NEGATIVE

## 2012-08-30 SURGERY — LAPAROSCOPIC CHOLECYSTECTOMY WITH INTRAOPERATIVE CHOLANGIOGRAM
Anesthesia: General | Site: Abdomen | Wound class: Clean Contaminated

## 2012-08-30 MED ORDER — HYDROCODONE-ACETAMINOPHEN 5-325 MG PO TABS: 1.0000 | ORAL_TABLET | ORAL | Status: DC | PRN

## 2012-08-30 MED ORDER — ONDANSETRON HCL 4 MG/2ML IJ SOLN
INTRAMUSCULAR | Status: AC
  Filled 2012-08-30: qty 2

## 2012-08-30 MED ORDER — NEOSTIGMINE METHYLSULFATE 1 MG/ML IJ SOLN
INTRAMUSCULAR | Status: DC | PRN
  Administered 2012-08-30: 4 mg via INTRAVENOUS

## 2012-08-30 MED ORDER — MUPIROCIN 2 % EX OINT
TOPICAL_OINTMENT | CUTANEOUS | Status: AC
  Administered 2012-08-30: 1
  Filled 2012-08-30: qty 22

## 2012-08-30 MED ORDER — HYDROMORPHONE HCL PF 1 MG/ML IJ SOLN
INTRAMUSCULAR | Status: AC
  Filled 2012-08-30: qty 2

## 2012-08-30 MED ORDER — SODIUM CHLORIDE 0.9 % IR SOLN
Status: DC | PRN
  Administered 2012-08-30: 1000 mL

## 2012-08-30 MED ORDER — SODIUM CHLORIDE 0.9 % IJ SOLN: 3.0000 mL | INTRAMUSCULAR | Status: DC | PRN

## 2012-08-30 MED ORDER — BUPIVACAINE-EPINEPHRINE PF 0.25-1:200000 % IJ SOLN
INTRAMUSCULAR | Status: AC
  Filled 2012-08-30: qty 30

## 2012-08-30 MED ORDER — MUPIROCIN 2 % EX OINT: TOPICAL_OINTMENT | Freq: Two times a day (BID) | CUTANEOUS | Status: DC

## 2012-08-30 MED ORDER — OXYCODONE HCL 5 MG PO TABS
5.0000 mg | ORAL_TABLET | ORAL | Status: DC | PRN
  Administered 2012-08-30: 10 mg via ORAL

## 2012-08-30 MED ORDER — ACETAMINOPHEN 10 MG/ML IV SOLN
INTRAVENOUS | Status: DC | PRN
  Administered 2012-08-30: 1000 mg via INTRAVENOUS

## 2012-08-30 MED ORDER — ACETAMINOPHEN 325 MG PO TABS
650.0000 mg | ORAL_TABLET | ORAL | Status: DC | PRN
  Filled 2012-08-30: qty 2

## 2012-08-30 MED ORDER — DEXAMETHASONE SODIUM PHOSPHATE 10 MG/ML IJ SOLN
INTRAMUSCULAR | Status: DC | PRN
  Administered 2012-08-30: 4 mg via INTRAVENOUS

## 2012-08-30 MED ORDER — LABETALOL HCL 5 MG/ML IV SOLN
5.0000 mg | Freq: Once | INTRAVENOUS | Status: AC
  Administered 2012-08-30: 5 mg via INTRAVENOUS

## 2012-08-30 MED ORDER — LIDOCAINE HCL (CARDIAC) 20 MG/ML IV SOLN
INTRAVENOUS | Status: DC | PRN
  Administered 2012-08-30: 80 mg via INTRAVENOUS

## 2012-08-30 MED ORDER — BUPIVACAINE-EPINEPHRINE 0.25% -1:200000 IJ SOLN
INTRAMUSCULAR | Status: DC | PRN
  Administered 2012-08-30: 20 mL

## 2012-08-30 MED ORDER — MORPHINE SULFATE 2 MG/ML IJ SOLN
INTRAMUSCULAR | Status: AC
Start: 2012-08-30 — End: 2012-08-30
  Administered 2012-08-30: 4 mg via INTRAVENOUS
  Filled 2012-08-30: qty 2

## 2012-08-30 MED ORDER — SODIUM CHLORIDE 0.9 % IJ SOLN: 3.0000 mL | Freq: Two times a day (BID) | INTRAMUSCULAR | Status: DC

## 2012-08-30 MED ORDER — ONDANSETRON HCL 4 MG/2ML IJ SOLN
INTRAMUSCULAR | Status: DC | PRN
  Administered 2012-08-30: 4 mg via INTRAVENOUS

## 2012-08-30 MED ORDER — LACTATED RINGERS IV SOLN
INTRAVENOUS | Status: DC | PRN
  Administered 2012-08-30 (×2): via INTRAVENOUS

## 2012-08-30 MED ORDER — LABETALOL HCL 5 MG/ML IV SOLN
INTRAVENOUS | Status: AC
  Administered 2012-08-30: 5 mg via INTRAVENOUS
  Filled 2012-08-30: qty 4

## 2012-08-30 MED ORDER — 0.9 % SODIUM CHLORIDE (POUR BTL) OPTIME
TOPICAL | Status: DC | PRN
  Administered 2012-08-30: 1000 mL

## 2012-08-30 MED ORDER — DEXTROSE 5 % IV SOLN
3.0000 g | INTRAVENOUS | Status: AC
  Administered 2012-08-30: 3 g via INTRAVENOUS
  Filled 2012-08-30: qty 3000

## 2012-08-30 MED ORDER — ACETAMINOPHEN 650 MG RE SUPP
650.0000 mg | RECTAL | Status: DC | PRN
  Filled 2012-08-30: qty 1

## 2012-08-30 MED ORDER — SUCCINYLCHOLINE CHLORIDE 20 MG/ML IJ SOLN
INTRAMUSCULAR | Status: DC | PRN
  Administered 2012-08-30: 140 mg via INTRAVENOUS

## 2012-08-30 MED ORDER — OXYCODONE HCL 5 MG PO TABS
ORAL_TABLET | ORAL | Status: AC
  Filled 2012-08-30: qty 2

## 2012-08-30 MED ORDER — PROPOFOL 10 MG/ML IV BOLUS
INTRAVENOUS | Status: DC | PRN
  Administered 2012-08-30: 200 mg via INTRAVENOUS

## 2012-08-30 MED ORDER — ONDANSETRON HCL 4 MG/2ML IJ SOLN
4.0000 mg | Freq: Four times a day (QID) | INTRAMUSCULAR | Status: DC | PRN
  Filled 2012-08-30: qty 2

## 2012-08-30 MED ORDER — ROCURONIUM BROMIDE 100 MG/10ML IV SOLN
INTRAVENOUS | Status: DC | PRN
  Administered 2012-08-30: 40 mg via INTRAVENOUS

## 2012-08-30 MED ORDER — GLYCOPYRROLATE 0.2 MG/ML IJ SOLN
INTRAMUSCULAR | Status: DC | PRN
  Administered 2012-08-30: 0.6 mg via INTRAVENOUS

## 2012-08-30 MED ORDER — PROMETHAZINE HCL 25 MG/ML IJ SOLN
25.0000 mg | Freq: Once | INTRAMUSCULAR | Status: AC
  Administered 2012-08-30: 25 mg via INTRAVENOUS
  Filled 2012-08-30: qty 1

## 2012-08-30 MED ORDER — FENTANYL CITRATE 0.05 MG/ML IJ SOLN
INTRAMUSCULAR | Status: DC | PRN
  Administered 2012-08-30: 50 ug via INTRAVENOUS
  Administered 2012-08-30: 150 ug via INTRAVENOUS
  Administered 2012-08-30: 50 ug via INTRAVENOUS

## 2012-08-30 MED ORDER — ACETAMINOPHEN 10 MG/ML IV SOLN
INTRAVENOUS | Status: AC
  Filled 2012-08-30: qty 100

## 2012-08-30 MED ORDER — MIDAZOLAM HCL 5 MG/5ML IJ SOLN
INTRAMUSCULAR | Status: DC | PRN
  Administered 2012-08-30: 2 mg via INTRAVENOUS

## 2012-08-30 MED ORDER — SODIUM CHLORIDE 0.9 % IV SOLN: 250.0000 mL | INTRAVENOUS | Status: DC | PRN

## 2012-08-30 MED ORDER — ONDANSETRON HCL 4 MG/2ML IJ SOLN
4.0000 mg | Freq: Once | INTRAMUSCULAR | Status: AC
  Administered 2012-08-30: 4 mg via INTRAVENOUS

## 2012-08-30 MED ORDER — MORPHINE SULFATE 4 MG/ML IJ SOLN: 4.0000 mg | INTRAMUSCULAR | Status: DC | PRN

## 2012-08-30 MED ORDER — HYDROMORPHONE HCL PF 1 MG/ML IJ SOLN
INTRAMUSCULAR | Status: AC
  Filled 2012-08-30: qty 1

## 2012-08-30 MED ORDER — HYDROMORPHONE HCL PF 1 MG/ML IJ SOLN
0.2500 mg | INTRAMUSCULAR | Status: DC | PRN
  Administered 2012-08-30 (×4): 0.5 mg via INTRAVENOUS

## 2012-08-30 SURGICAL SUPPLY — 43 items
APL SKNCLS STERI-STRIP NONHPOA (GAUZE/BANDAGES/DRESSINGS) ×1
APPLIER CLIP 5 13 M/L LIGAMAX5 (MISCELLANEOUS) ×2
APR CLP MED LRG 5 ANG JAW (MISCELLANEOUS) ×1
BAG SPEC RTRVL LRG 6X4 10 (ENDOMECHANICALS) ×1
BANDAGE ADHESIVE 1X3 (GAUZE/BANDAGES/DRESSINGS) ×8 IMPLANT
BENZOIN TINCTURE PRP APPL 2/3 (GAUZE/BANDAGES/DRESSINGS) ×2 IMPLANT
CANISTER SUCTION 2500CC (MISCELLANEOUS) ×2 IMPLANT
CHLORAPREP W/TINT 26ML (MISCELLANEOUS) ×2 IMPLANT
CLIP APPLIE 5 13 M/L LIGAMAX5 (MISCELLANEOUS) ×1 IMPLANT
CLOTH BEACON ORANGE TIMEOUT ST (SAFETY) ×2 IMPLANT
COVER MAYO STAND STRL (DRAPES) IMPLANT
COVER SURGICAL LIGHT HANDLE (MISCELLANEOUS) ×2 IMPLANT
DECANTER SPIKE VIAL GLASS SM (MISCELLANEOUS) ×1 IMPLANT
DRAPE C-ARM 42X72 X-RAY (DRAPES) IMPLANT
ELECT REM PT RETURN 9FT ADLT (ELECTROSURGICAL) ×2
ELECTRODE REM PT RTRN 9FT ADLT (ELECTROSURGICAL) ×1 IMPLANT
GLOVE BIO SURGEON STRL SZ7.5 (GLOVE) ×1 IMPLANT
GLOVE BIOGEL PI IND STRL 6.5 (GLOVE) IMPLANT
GLOVE BIOGEL PI IND STRL 7.5 (GLOVE) IMPLANT
GLOVE BIOGEL PI INDICATOR 6.5 (GLOVE) ×1
GLOVE BIOGEL PI INDICATOR 7.5 (GLOVE) ×3
GLOVE ECLIPSE 6.5 STRL STRAW (GLOVE) ×1 IMPLANT
GLOVE ECLIPSE 7.5 STRL STRAW (GLOVE) ×1 IMPLANT
GLOVE SURG SIGNA 7.5 PF LTX (GLOVE) ×2 IMPLANT
GOWN PREVENTION PLUS XLARGE (GOWN DISPOSABLE) ×2 IMPLANT
GOWN STRL NON-REIN LRG LVL3 (GOWN DISPOSABLE) ×6 IMPLANT
KIT BASIN OR (CUSTOM PROCEDURE TRAY) ×2 IMPLANT
KIT ROOM TURNOVER OR (KITS) ×2 IMPLANT
NS IRRIG 1000ML POUR BTL (IV SOLUTION) ×2 IMPLANT
PAD ARMBOARD 7.5X6 YLW CONV (MISCELLANEOUS) ×2 IMPLANT
POUCH SPECIMEN RETRIEVAL 10MM (ENDOMECHANICALS) ×1 IMPLANT
SCISSORS LAP 5X35 DISP (ENDOMECHANICALS) IMPLANT
SET CHOLANGIOGRAPH 5 50 .035 (SET/KITS/TRAYS/PACK) IMPLANT
SET IRRIG TUBING LAPAROSCOPIC (IRRIGATION / IRRIGATOR) ×2 IMPLANT
SLEEVE ENDOPATH XCEL 5M (ENDOMECHANICALS) ×4 IMPLANT
SPECIMEN JAR SMALL (MISCELLANEOUS) ×2 IMPLANT
SUT MNCRL AB 4-0 PS2 18 (SUTURE) ×2 IMPLANT
SUT MON AB 4-0 PC3 18 (SUTURE) ×1 IMPLANT
TOWEL OR 17X24 6PK STRL BLUE (TOWEL DISPOSABLE) ×2 IMPLANT
TOWEL OR 17X26 10 PK STRL BLUE (TOWEL DISPOSABLE) ×2 IMPLANT
TRAY LAPAROSCOPIC (CUSTOM PROCEDURE TRAY) ×2 IMPLANT
TROCAR XCEL BLUNT TIP 100MML (ENDOMECHANICALS) ×2 IMPLANT
TROCAR XCEL NON-BLD 5MMX100MML (ENDOMECHANICALS) ×2 IMPLANT

## 2012-08-30 NOTE — Preoperative (Signed)
Beta Blockers   Reason not to administer Beta Blockers:Not Applicable 

## 2012-08-30 NOTE — Transfer of Care (Signed)
Immediate Anesthesia Transfer of Care Note  Patient: Holly [name]  Procedure(s) Performed: Procedure(s): LAPAROSCOPIC CHOLECYSTECTOMY WITH INTRAOPERATIVE CHOLANGIOGRAM (N/A)  Patient Location: PACU  Anesthesia Type:General  Level of Consciousness: awake and oriented  Airway & Oxygen Therapy: Patient Spontanous Breathing and Patient connected to nasal cannula oxygen  Post-op Assessment: Report given to PACU RN  Post vital signs: Reviewed  Complications: No apparent anesthesia complications

## 2012-08-30 NOTE — Progress Notes (Signed)
Attempt to sit patient on side of bed to determine if she could tolerate going home. Patient burped twice them vomited yellowish material. She reports only mild pain. Contacted Dr. Magnus Ivan for additional orders.

## 2012-08-30 NOTE — Op Note (Signed)
Laparoscopic Cholecystectomy Procedure Note  Indications: This patient presents with symptomatic gallbladder disease and will undergo laparoscopic cholecystectomy.  Pre-operative Diagnosis: Calculus of gallbladder with other cholecystitis, without mention of obstruction  Post-operative Diagnosis: Same  Surgeon: Abigail Miyamoto A   Assistants: 0  Anesthesia: General endotracheal anesthesia  ASA Class: 1  Procedure Details  The patient was seen again in the Holding Room. The risks, benefits, complications, treatment options, and expected outcomes were discussed with the patient. The possibilities of reaction to medication, pulmonary aspiration, perforation of viscus, bleeding, recurrent infection, finding a normal gallbladder, the need for additional procedures, failure to diagnose a condition, the possible need to convert to an open procedure, and creating a complication requiring transfusion or operation were discussed with the patient. The likelihood of improving the patient's symptoms with return to their baseline status is good.  The patient and/or family concurred with the proposed plan, giving informed consent. The site of surgery properly noted. The patient was taken to Operating Room, identified as Holly [name] and the procedure verified as Laparoscopic Cholecystectomy with Intraoperative Cholangiogram. A Time Out was held and the above information confirmed.  Prior to the induction of general anesthesia, antibiotic prophylaxis was administered. General endotracheal anesthesia was then administered and tolerated well. After the induction, the abdomen was prepped with Chloraprep and draped in sterile fashion. The patient was positioned in the supine position.  Local anesthetic agent was injected into the skin near the umbilicus and an incision made. We dissected down to the abdominal fascia with blunt dissection.  The fascia was incised vertically and we entered the peritoneal cavity  bluntly.  A pursestring suture of 0-Vicryl was placed around the fascial opening.  The Hasson cannula was inserted and secured with the stay suture.  Pneumoperitoneum was then created with CO2 and tolerated well without any adverse changes in the patient's vital signs. An 11-mm port was placed in the subxiphoid position.  Two 5-mm ports were placed in the right upper quadrant. All skin incisions were infiltrated with a local anesthetic agent before making the incision and placing the trocars.   We positioned the patient in reverse Trendelenburg, tilted slightly to the patient's left.  The gallbladder was identified, the fundus grasped and retracted cephalad. Adhesions were lysed bluntly and with the electrocautery where indicated, taking care not to injure any adjacent organs or viscus. The infundibulum was grasped and retracted laterally, exposing the peritoneum overlying the triangle of Calot. This was then divided and exposed in a blunt fashion. The cystic duct was clearly identified and bluntly dissected circumferentially. A critical view of the cystic duct and cystic artery was obtained.  The cystic duct was then ligated with clips and divided. The cystic artery was, dissected free, ligated with clips and divided as well.   The gallbladder was dissected from the liver bed in retrograde fashion with the electrocautery. The gallbladder was removed and placed in an Endocatch sac. The liver bed was irrigated and inspected. Hemostasis was achieved with the electrocautery. Copious irrigation was utilized and was repeatedly aspirated until clear.  The gallbladder and Endocatch sac were then removed through the umbilical port site.  The pursestring suture was used to close the umbilical fascia.    We again inspected the right upper quadrant for hemostasis.  Pneumoperitoneum was released as we removed the trocars.  4-0 Monocryl was used to close the skin.   Benzoin, steri-strips, and clean dressings were applied.  The patient was then extubated and brought to the recovery  room in stable condition. Instrument, sponge, and needle counts were correct at closure and at the conclusion of the case.   Findings: Cholecystitis with Cholelithiasis  Estimated Blood Loss: Minimal         Drains:          Specimens: Gallbladder           Complications: None; patient tolerated the procedure well.         Disposition: PACU - hemodynamically stable.         Condition: stable

## 2012-08-30 NOTE — Progress Notes (Signed)
Patient tolerated PO fluids and sitting up. Will D/c home

## 2012-08-30 NOTE — Anesthesia Postprocedure Evaluation (Signed)
  Anesthesia Post-op Note  Patient: Holly [name]  Procedure(s) Performed: Procedure(s): LAPAROSCOPIC CHOLECYSTECTOMY WITH INTRAOPERATIVE CHOLANGIOGRAM (N/A)  Patient Location: PACU  Anesthesia Type:General  Level of Consciousness: awake  Airway and Oxygen Therapy: Patient Spontanous Breathing  Post-op Pain: mild  Post-op Assessment: Post-op Vital signs reviewed  Post-op Vital Signs: Reviewed  Complications: No apparent anesthesia complications

## 2012-08-30 NOTE — Progress Notes (Addendum)
Patient reports that nausea in decreasing. Pt reports that pain is 7/10 is in her RUQ area near diaphragm area. Patient abd. Soft. Patient reports that she is currently breast feeding once mother returns will contact pharmacy and have them come speak to patient and mother regarding medication use with breast feeding. Will continue to monitor BP, pain. Patient lower extremities edema free.

## 2012-08-30 NOTE — Anesthesia Preprocedure Evaluation (Signed)
Anesthesia Evaluation  Patient identified by MRN, date of birth, ID band Patient awake    Reviewed: Allergy & Precautions, H&P , NPO status   History of Anesthesia Complications (+) PONV  Airway Mallampati: II      Dental   Pulmonary neg pulmonary ROS,  breath sounds clear to auscultation        Cardiovascular hypertension, Pt. on medications Rhythm:Regular Rate:Normal     Neuro/Psych    GI/Hepatic negative GI ROS, Neg liver ROS,   Endo/Other  negative endocrine ROS  Renal/GU negative Renal ROS     Musculoskeletal   Abdominal   Peds  Hematology negative hematology ROS (+)   Anesthesia Other Findings   Reproductive/Obstetrics                           Anesthesia Physical Anesthesia Plan  ASA: III  Anesthesia Plan: General   Post-op Pain Management:    Induction: Intravenous  Airway Management Planned: Oral ETT  Additional Equipment:   Intra-op Plan:   Post-operative Plan: Extubation in OR  Informed Consent: I have reviewed the patients History and Physical, chart, labs and discussed the procedure including the risks, benefits and alternatives for the proposed anesthesia with the patient or authorized representative who has indicated his/her understanding and acceptance.   Dental advisory given  Plan Discussed with: CRNA, Anesthesiologist and Surgeon  Anesthesia Plan Comments:         Anesthesia Quick Evaluation

## 2012-08-30 NOTE — Interval H&P Note (Signed)
History and Physical Interval Note: no change in H and P  08/30/2012 6:56 AM  Holly [name]  has presented today for surgery, with the diagnosis of gallstones  The various methods of treatment have been discussed with the patient and family. After consideration of risks, benefits and other options for treatment, the patient has consented to  Procedure(s): LAPAROSCOPIC CHOLECYSTECTOMY WITH INTRAOPERATIVE CHOLANGIOGRAM (N/A) as a surgical intervention .  The patient's history has been reviewed, patient examined, no change in status, stable for surgery.  I have reviewed the patient's chart and labs.  Questions were answered to the patient's satisfaction.     [name]

## 2012-08-30 NOTE — Progress Notes (Signed)
Patient sleeping, easily arousalable.

## 2012-09-02 ENCOUNTER — Encounter (HOSPITAL_COMMUNITY): Payer: Self-pay | Admitting: Surgery

## 2012-09-03 ENCOUNTER — Telehealth (INDEPENDENT_AMBULATORY_CARE_PROVIDER_SITE_OTHER): Payer: Self-pay | Admitting: General Surgery

## 2012-09-03 NOTE — Telephone Encounter (Signed)
Called patient back to let her know that she can't return back to work until she comes back in to see the doctor tomorrow in the urgent office the surgery site maybe infected

## 2012-09-04 ENCOUNTER — Encounter (INDEPENDENT_AMBULATORY_CARE_PROVIDER_SITE_OTHER): Payer: Self-pay

## 2012-09-04 ENCOUNTER — Encounter (INDEPENDENT_AMBULATORY_CARE_PROVIDER_SITE_OTHER): Payer: Self-pay | Admitting: Surgery

## 2012-09-04 ENCOUNTER — Ambulatory Visit (INDEPENDENT_AMBULATORY_CARE_PROVIDER_SITE_OTHER): Payer: Medicaid Other | Admitting: Surgery

## 2012-09-04 VITALS — BP 126/74 | HR 62 | Temp 99.4°F | Resp 12 | Ht 65.0 in | Wt 270.8 lb

## 2012-09-04 DIAGNOSIS — Z9889 Other specified postprocedural states: Secondary | ICD-10-CM

## 2012-09-04 MED ORDER — ERYTHROMYCIN BASE 500 MG PO TABS: 500.0000 mg | ORAL_TABLET | Freq: Three times a day (TID) | ORAL | Status: AC

## 2012-09-04 NOTE — Progress Notes (Signed)
The patient returns to clinic after laparoscopic cholecystectomy Dr. Rayburn Ma 4 days ago. She is having some nausea with meals afterwards limiting her intake. He has tried Zofran and Phenergan with no response. There is no vomiting. She had drainage from her superior incision.  Exam: Laparoscopic sites clean dry and intact without signs of infection. Soft nontender without signs of peritonitis.  Impression: Postoperative nausea after laparoscopic cholecystectomy  Postpartum and breast feeding an  infant  Plan: erythromycin 500 mg TID.  Small low fat meals.  Plenty of fluids.  If no better in 3 days,  Call back.

## 2012-09-04 NOTE — Patient Instructions (Signed)
Take E mycin three times a day with meals for nausea.  If no better in 3 days call back.

## 2012-09-11 ENCOUNTER — Telehealth (INDEPENDENT_AMBULATORY_CARE_PROVIDER_SITE_OTHER): Payer: Self-pay | Admitting: General Surgery

## 2012-09-11 ENCOUNTER — Other Ambulatory Visit (INDEPENDENT_AMBULATORY_CARE_PROVIDER_SITE_OTHER): Payer: Self-pay | Admitting: Surgery

## 2012-09-11 ENCOUNTER — Ambulatory Visit (INDEPENDENT_AMBULATORY_CARE_PROVIDER_SITE_OTHER): Payer: Medicaid Other | Admitting: Surgery

## 2012-09-11 ENCOUNTER — Encounter (INDEPENDENT_AMBULATORY_CARE_PROVIDER_SITE_OTHER): Payer: Self-pay | Admitting: Surgery

## 2012-09-11 ENCOUNTER — Other Ambulatory Visit (INDEPENDENT_AMBULATORY_CARE_PROVIDER_SITE_OTHER): Payer: Self-pay | Admitting: General Surgery

## 2012-09-11 VITALS — BP 138/72 | HR 73 | Temp 97.4°F | Resp 18 | Ht 65.0 in | Wt 268.8 lb

## 2012-09-11 DIAGNOSIS — R112 Nausea with vomiting, unspecified: Secondary | ICD-10-CM

## 2012-09-11 DIAGNOSIS — Z9889 Other specified postprocedural states: Secondary | ICD-10-CM

## 2012-09-11 NOTE — Progress Notes (Signed)
Subjective:     Patient ID: Holly [name], female   DOB: [birth year], 32 y.o.   MRN: [id number]  HPI She is status post laparoscopic cholecystectomy performed on May 16. She came to our urgent office on the 20th with some drainage from her incision. She was placed on erythromycin at that time. She initially stopped it within the restarted today. She has cramping abdominal pain as well as nausea and vomiting. This has progressed at surgery  Review of Systems     Objective:   Physical Exam On exam, she is well appearance. She is afebrile and not tachycardic. Her abdomen is soft with minimal tenderness    Assessment:     Postop abdominal pain and nausea     Plan:     I am going to check a CBC and a CMP. I also will prescribe Phenergan. I will Let her know of the laboratory study Results. If these are abnormal, she may need a hiatus scan to rule out a bile leak or retained stone.

## 2012-09-11 NOTE — Telephone Encounter (Signed)
Patient calling back status post lap chole on 08/30/2012 with ongoing nausea. She was seen on 09/04/2012 in urgent office. She has been taking zofran but this isn't helping. She vomited today and she states she had bright red blood in her vomit. She is also having sharp stabbing pains across her entire abdomen that started off and on since yesterday. She is moving her bowels daily with no blood in her stool. Please advise. [phone].

## 2012-09-12 LAB — COMPREHENSIVE METABOLIC PANEL
ALT: 20 U/L (ref 0–35)
AST: 18 U/L (ref 0–37)
Albumin: 4.3 g/dL (ref 3.5–5.2)
CO2: 26 mEq/L (ref 19–32)
Calcium: 9.5 mg/dL (ref 8.4–10.5)
Chloride: 104 mEq/L (ref 96–112)
Potassium: 4.2 mEq/L (ref 3.5–5.3)
Total Protein: 7.5 g/dL (ref 6.0–8.3)

## 2012-09-12 LAB — CBC
MCHC: 33 g/dL (ref 30.0–36.0)
Platelets: 489 10*3/uL — ABNORMAL HIGH (ref 150–400)
RDW: 16 % — ABNORMAL HIGH (ref 11.5–15.5)
WBC: 10.4 10*3/uL (ref 4.0–10.5)

## 2012-09-20 ENCOUNTER — Ambulatory Visit (INDEPENDENT_AMBULATORY_CARE_PROVIDER_SITE_OTHER): Payer: Medicaid Other | Admitting: Surgery

## 2012-09-20 ENCOUNTER — Encounter (INDEPENDENT_AMBULATORY_CARE_PROVIDER_SITE_OTHER): Payer: Self-pay | Admitting: Surgery

## 2012-09-20 VITALS — BP 160/98 | HR 84 | Temp 97.4°F | Resp 14 | Ht 65.0 in | Wt 273.8 lb

## 2012-09-20 DIAGNOSIS — Z9889 Other specified postprocedural states: Secondary | ICD-10-CM

## 2012-09-20 NOTE — Progress Notes (Signed)
Subjective:     Patient ID: Holly [name], female   DOB: 1980/09/23, 32 y.o.   MRN: [id number]  HPI She is here for a month postop visit. She has much less pain in the right upper quadrant. I checked a CBC and a CMP which were normal. She is moving her bowels well and has no nausea  Review of Systems     Objective:   Physical Exam On exam, she is well and appearance. Her vitals are normal. Her abdomen is soft and nontender    Assessment:     Patient stable postop     Plan:     I believe she will continue to full recovery. I will see her back as needed. She may return to work and normal activities.

## 2012-09-30 ENCOUNTER — Ambulatory Visit (INDEPENDENT_AMBULATORY_CARE_PROVIDER_SITE_OTHER): Payer: Medicaid Other | Admitting: Emergency Medicine

## 2012-09-30 ENCOUNTER — Encounter: Payer: Self-pay | Admitting: Emergency Medicine

## 2012-09-30 VITALS — BP 170/113 | HR 80 | Temp 98.4°F | Ht 65.0 in | Wt 281.0 lb

## 2012-09-30 DIAGNOSIS — R1110 Vomiting, unspecified: Secondary | ICD-10-CM

## 2012-09-30 DIAGNOSIS — Z9089 Acquired absence of other organs: Secondary | ICD-10-CM

## 2012-09-30 DIAGNOSIS — Z9049 Acquired absence of other specified parts of digestive tract: Secondary | ICD-10-CM

## 2012-09-30 DIAGNOSIS — I10 Essential (primary) hypertension: Secondary | ICD-10-CM | POA: Insufficient documentation

## 2012-09-30 DIAGNOSIS — M549 Dorsalgia, unspecified: Secondary | ICD-10-CM

## 2012-09-30 DIAGNOSIS — Z0000 Encounter for general adult medical examination without abnormal findings: Secondary | ICD-10-CM

## 2012-09-30 DIAGNOSIS — R030 Elevated blood-pressure reading, without diagnosis of hypertension: Secondary | ICD-10-CM

## 2012-09-30 LAB — CBC
Hemoglobin: 12.6 g/dL (ref 12.0–15.0)
MCH: 26.6 pg (ref 26.0–34.0)
MCHC: 32.7 g/dL (ref 30.0–36.0)
RDW: 15 % (ref 11.5–15.5)

## 2012-09-30 LAB — COMPREHENSIVE METABOLIC PANEL
ALT: 23 U/L (ref 0–35)
AST: 19 U/L (ref 0–37)
Albumin: 4.2 g/dL (ref 3.5–5.2)
Alkaline Phosphatase: 79 U/L (ref 39–117)
BUN: 10 mg/dL (ref 6–23)
Potassium: 4.3 mEq/L (ref 3.5–5.3)
Sodium: 143 mEq/L (ref 135–145)
Total Protein: 7.2 g/dL (ref 6.0–8.3)

## 2012-09-30 MED ORDER — PROMETHAZINE HCL 25 MG PO TABS: 25.0000 mg | ORAL_TABLET | Freq: Three times a day (TID) | ORAL | Status: DC | PRN

## 2012-09-30 NOTE — Patient Instructions (Signed)
It was nice to meet you! I'm not sure what is causing your vomiting.  We will check some blood work today. Please avoid drinking fluids after eating as this seems to trigger the vomiting. Take phenergan 1 tablet every 8 hours as needed for nausea/vomiting. Your blood pressure is up today.  If it is not coming down as this vomiting improves, we will need to start a medicine. Follow up in 1 week.  It is okay to see another doctor, if I do not have anything available.   Please schedule another appointment with me so we can talk about the back pain. If your belly pain gets worse or you see lots of blood in the vomiting, go to the ER.

## 2012-09-30 NOTE — Assessment & Plan Note (Signed)
Has had both normal and elevated readings in the past.  Not currently on any BP meds.  With concurrent vomiting, will recheck at follow up in 1 week.  If remains elevated, will likely need to start a medication - HCTZ is okay, but rarely will reduce breast milk production.

## 2012-09-30 NOTE — Progress Notes (Signed)
  Subjective:    Patient ID: Holly [name], female    DOB: April 01, 1981, 32 y.o.   MRN: [id number]  HPI Holly [name] is here for a new patient appt and vomiting.  1. 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 Vomiting: Patient reports vomiting for the last 3 days.  It occurs when she drinks fluids after eating.  Denies epigastric pain or nausea associated with food.  Started with some sharp stomach pains yesterday that occur about 1 hour after vomiting.  No fevers, diarrhea, constipation, sick contacts.  Emesis is stomach contents.  This morning had a small amount of blood in it but has not had further vomiting since 8am.  Did have a lap chole about 3 weeks ago.  Review of Systems See HPI    Objective:   Physical Exam BP 170/113  Pulse 80  Temp(Src) 98.4 F (36.9 C) (Oral)  Ht 5\' 5"  (1.651 m)  Wt 281 lb (127.461 kg)  BMI 46.76 kg/m2 Gen: alert, cooperative, NAD HEENT: AT/[place], sclera white, MMM Neck: supple CV: RRR, no murmurs Pulm: CTAB, no wheezes or rales Abd: +BS, soft, ND, diffuse mild tenderness, no specific epigastric or RUQ pain Ext: no edema     Assessment & Plan:

## 2012-09-30 NOTE — Assessment & Plan Note (Signed)
Unclear etiology.  No red flags for infection or ulcer.  I suspect some component of distention given that it only occurs if she drinks something after eating.  Did have some blood in the vomit this morning, suspect small mallory-weiss tear.  Will check CMP and CBC.  Phenergan q8hr prn for vomiting.  Discussed not drinking anything after eating to try and prevent vomiting.  Follow up in 1 week to make sure hematemesis has resolved.

## 2012-09-30 NOTE — Assessment & Plan Note (Signed)
Will address at next appointment

## 2012-09-30 NOTE — Assessment & Plan Note (Signed)
Up to date on TDaP and pap smear.

## 2012-10-05 ENCOUNTER — Encounter (HOSPITAL_COMMUNITY): Payer: Self-pay

## 2012-10-05 ENCOUNTER — Emergency Department (HOSPITAL_COMMUNITY)
Admission: EM | Admit: 2012-10-05 | Discharge: 2012-10-05 | Disposition: A | Discharge status: Home / Self Care | Payer: Medicaid Other | Attending: Emergency Medicine | Admitting: Emergency Medicine

## 2012-10-05 DIAGNOSIS — Z872 Personal history of diseases of the skin and subcutaneous tissue: Secondary | ICD-10-CM | POA: Insufficient documentation

## 2012-10-05 DIAGNOSIS — Z3202 Encounter for pregnancy test, result negative: Secondary | ICD-10-CM | POA: Insufficient documentation

## 2012-10-05 DIAGNOSIS — Z9089 Acquired absence of other organs: Secondary | ICD-10-CM | POA: Insufficient documentation

## 2012-10-05 DIAGNOSIS — Z9889 Other specified postprocedural states: Secondary | ICD-10-CM | POA: Insufficient documentation

## 2012-10-05 DIAGNOSIS — R112 Nausea with vomiting, unspecified: Secondary | ICD-10-CM | POA: Insufficient documentation

## 2012-10-05 LAB — CBC WITH DIFFERENTIAL/PLATELET
Basophils Relative: 0 % (ref 0–1)
HCT: 36.6 % (ref 36.0–46.0)
Hemoglobin: 11.7 g/dL — ABNORMAL LOW (ref 12.0–15.0)
Lymphs Abs: 2.8 10*3/uL (ref 0.7–4.0)
MCH: 26.6 pg (ref 26.0–34.0)
MCHC: 32 g/dL (ref 30.0–36.0)
Monocytes Absolute: 0.5 10*3/uL (ref 0.1–1.0)
Monocytes Relative: 6 % (ref 3–12)
Neutro Abs: 4.2 10*3/uL (ref 1.7–7.7)
Neutrophils Relative %: 56 % (ref 43–77)
WBC: 7.6 10*3/uL (ref 4.0–10.5)

## 2012-10-05 LAB — COMPREHENSIVE METABOLIC PANEL
ALT: 23 U/L (ref 0–35)
AST: 27 U/L (ref 0–37)
CO2: 25 mEq/L (ref 19–32)
Calcium: 8.9 mg/dL (ref 8.4–10.5)
Potassium: 4 mEq/L (ref 3.5–5.1)
Sodium: 139 mEq/L (ref 135–145)
Total Protein: 7.5 g/dL (ref 6.0–8.3)

## 2012-10-05 LAB — URINALYSIS, ROUTINE W REFLEX MICROSCOPIC
Glucose, UA: NEGATIVE mg/dL
Specific Gravity, Urine: 1.022 (ref 1.005–1.030)
pH: 6 (ref 5.0–8.0)

## 2012-10-05 LAB — URINE MICROSCOPIC-ADD ON

## 2012-10-05 MED ORDER — PROMETHAZINE HCL 25 MG/ML IJ SOLN
25.0000 mg | Freq: Once | INTRAMUSCULAR | Status: DC
  Filled 2012-10-05 (×2): qty 1

## 2012-10-05 MED ORDER — SODIUM CHLORIDE 0.9 % IV BOLUS (SEPSIS)
1000.0000 mL | Freq: Once | INTRAVENOUS | Status: AC
  Administered 2012-10-05: 1000 mL via INTRAVENOUS

## 2012-10-05 MED ORDER — ONDANSETRON HCL 4 MG/2ML IJ SOLN
4.0000 mg | Freq: Once | INTRAMUSCULAR | Status: AC
  Administered 2012-10-05: 4 mg via INTRAVENOUS
  Filled 2012-10-05: qty 2

## 2012-10-05 NOTE — ED Notes (Addendum)
Patient had nausea and vomiting that started last week, however, took phenergan and it went away. Had nausea and vomiting this morning she took phenergan which did not help. She vomited an hour after taking phenergan. Patient states that she is vomiting up bright red blood that when described appears to be quarter sized amounts. Has vomited 3X this morning. States that it was approx each time. Denies having diarrhea. Patient has hx of rectal bleed. Gallbladder taken out 1 month ago.

## 2012-10-05 NOTE — ED Provider Notes (Signed)
Medical screening examination/treatment/procedure(s) were performed by non-physician practitioner and as supervising physician I was immediately available for consultation/collaboration.   Shelda Jakes, MD 10/05/12 [phone]

## 2012-10-05 NOTE — ED Notes (Addendum)
Nausea is better, "about gone". No vomiting episodes thus far.

## 2012-10-05 NOTE — ED Notes (Signed)
Patient's nausea has gotten better

## 2012-10-05 NOTE — ED Provider Notes (Signed)
History     CSN: [id number]  Arrival date & time 10/05/12  1402   None     Chief Complaint  Patient presents with  . Nausea  . Emesis    (Consider location/radiation/quality/duration/timing/severity/associated sxs/prior treatment) HPI Comments: Patient is a 32 year old female who presents with nausea and vomiting that started this morning. Symptoms started gradually and remained constant since the onset. Patient tried Phenergan for symptoms which did not help. Patient reports having an episode of nausea and vomiting 1 week ago and she took phenergan which relieved her symptoms. Patient reports some streaks of blood noted in her emesis. No other associated symptoms. No aggravating/alleviating factors. Patient had her gallbladder removed 1 month ago.    Past Medical History  Diagnosis Date  . Pregnancy   . Pregnancy induced hypertension   . Eczema     Hx: of  . Seasonal allergies     Hx: of    Past Surgical History  Procedure Laterality Date  . No past surgeries    . C-sectionx1    . Cesarean section    . Upper gi endoscopy      Hx: of  . Cholecystectomy N/A 08/30/2012    Procedure: LAPAROSCOPIC CHOLECYSTECTOMY WITH INTRAOPERATIVE CHOLANGIOGRAM;  Surgeon: Shelly Rubenstein, MD;  Location: [facility] OR;  Service: General;  Laterality: N/A;    Family History  Problem Relation Age of Onset  . Colon cancer Neg Hx   . Esophageal cancer Neg Hx   . Stomach cancer Neg Hx   . Lung cancer Paternal Grandmother   . Diabetes Maternal Grandfather   . Kidney disease Paternal Uncle   . Hypertension Father   . Hyperlipidemia Father     History  Substance Use Topics  . Smoking status: Never Smoker   . Smokeless tobacco: Never Used  . Alcohol Use: No    OB History   Grav Para Term Preterm Abortions TAB SAB Ect Mult Living   3 2 2  1  1   2       Review of Systems  Gastrointestinal: Positive for nausea and vomiting.  All other systems reviewed and are negative.    Allergies   Naproxen sodium  Home Medications   Current Outpatient Rx  Name  Route  Sig  Dispense  Refill  . HYDROcodone-acetaminophen (NORCO/VICODIN) 5-325 MG per tablet   Oral   Take 1-2 tablets by mouth every 4 (four) hours as needed for pain.   30 tablet   1   . ondansetron (ZOFRAN) 4 MG tablet   Oral   Take 1 tablet (4 mg total) by mouth every 6 (six) hours.   12 tablet   0   . Prenatal Vit-Fe Fumarate-FA (PRENATAL MULTIVITAMIN) TABS   Oral   Take 1 tablet by mouth daily.         . promethazine (PHENERGAN) 25 MG tablet   Oral   Take 1 tablet (25 mg total) by mouth every 8 (eight) hours as needed for nausea.   20 tablet   0     BP 146/84  Pulse 72  Temp(Src) 98.3 F (36.8 C) (Oral)  Resp 22  SpO2 97%  LMP 09/23/2012  Physical Exam  Nursing note and vitals reviewed. Constitutional: She is oriented to person, place, and time. She appears well-developed and well-nourished. No distress.  HENT:  Head: Normocephalic and atraumatic.  Eyes: Conjunctivae and EOM are normal. No scleral icterus.  Neck: Normal range of motion.  Cardiovascular: Normal  rate and regular rhythm.  Exam reveals no gallop and no friction rub.   No murmur heard. Pulmonary/Chest: Effort normal and breath sounds normal. She has no wheezes. She has no rales. She exhibits no tenderness.  Abdominal: Soft. She exhibits no distension. There is no tenderness. There is no rebound and no guarding.  Musculoskeletal: Normal range of motion.  Neurological: She is alert and oriented to person, place, and time. Coordination normal.  Speech is goal-oriented. Moves limbs without ataxia.   Skin: Skin is warm and dry.  Psychiatric: She has a normal mood and affect. Her behavior is normal.    ED Course  Procedures (including critical care time)  Labs Reviewed  CBC WITH DIFFERENTIAL - Abnormal; Notable for the following:    Hemoglobin 11.7 (*)    All other components within normal limits  URINALYSIS, ROUTINE W  REFLEX MICROSCOPIC - Abnormal; Notable for the following:    APPearance CLOUDY (*)    Hgb urine dipstick TRACE (*)    Leukocytes, UA TRACE (*)    All other components within normal limits  URINE MICROSCOPIC-ADD ON - Abnormal; Notable for the following:    Squamous Epithelial / LPF FEW (*)    Bacteria, UA FEW (*)    All other components within normal limits  URINE CULTURE  COMPREHENSIVE METABOLIC PANEL  LIPASE, BLOOD  PREGNANCY, URINE   No results found.   1. Nausea and vomiting       MDM  3:01 PM Labs pending. Patient will have morphine and zofran for symptoms. Vitals stable and patient afebrile.   7:28 PM Labs unremarkable. Patient feeling better. Patient give IV phenergan for some continued nausea. Vitals stable and patient afebrile. No further evaluation needed at this time. Patient will be discharged with instructions to return with worsening or concerning symptoms.      Emilia Beck, [place] 10/05/12 1947

## 2012-10-06 LAB — URINE CULTURE
Colony Count: 15000
Special Requests: NORMAL

## 2012-10-09 ENCOUNTER — Encounter: Payer: Self-pay | Admitting: Family Medicine

## 2012-10-09 ENCOUNTER — Ambulatory Visit (INDEPENDENT_AMBULATORY_CARE_PROVIDER_SITE_OTHER): Payer: Medicaid Other | Admitting: Family Medicine

## 2012-10-09 VITALS — BP 145/98 | HR 86 | Ht 65.0 in | Wt 284.0 lb

## 2012-10-09 DIAGNOSIS — M549 Dorsalgia, unspecified: Secondary | ICD-10-CM

## 2012-10-09 DIAGNOSIS — R1110 Vomiting, unspecified: Secondary | ICD-10-CM

## 2012-10-09 MED ORDER — IBUPROFEN 800 MG PO TABS: 800.0000 mg | ORAL_TABLET | Freq: Three times a day (TID) | ORAL | Status: DC | PRN

## 2012-10-09 NOTE — Patient Instructions (Signed)
Thank you for coming in today, it was good to see you I am glad your nausea has improved.  I am going to send in a prescription for ibuprofen for you to use when your back is bothering you If this continues please follow up with Dr. Elwyn Reach

## 2012-10-10 NOTE — Progress Notes (Signed)
  Subjective:    Patient ID: Holly [name], female    DOB: [birth year], 32 y.o.   MRN: [id number]  HPI  1. F/u nausea and vomiting:  Reports nausea and vomiting have improved.  She has not had any further episodes for the past week.  She has not had to use phenergan for nausea recently.  She denies abdominal pain, blood in her stool, diarrhea, fever or chills.   2. Back pain:  Reports back pain that has been off and on since the birth of her last child in February.  Describes pain as "sharp" with radiation into her R buttock area.  She has tried ibuprofen but only using 2 tablets per day.  She carries her baby on her L side typically.  She denies any weakness in her leg, bowel or bladder incontinence.  Review of Systems Per HPI    Objective:   Physical Exam  Constitutional:  Obese female, nad   Cardiovascular: Normal rate and regular rhythm.   Pulmonary/Chest: Effort normal and breath sounds normal.  Abdominal: Soft. Bowel sounds are normal. She exhibits no distension. There is no tenderness.  No CVA tenderness  Musculoskeletal:  ROM is normal in lower back.  There is no bony tenderness or step off palpated.  She does have some tenderness along the paraspinal muscles on the lower left side.  She has a negative faber and SLR tests.            Assessment & Plan:

## 2012-10-10 NOTE — Assessment & Plan Note (Signed)
Continued back pain, seems to be related to muscle strain with some mild spasm on the L.  She carries her child on her left side typically discussed positional changes that may help with this.  Also given rx for 800mg  ibuprofen to use for the next few days to see if this is helpful.

## 2012-10-10 NOTE — Assessment & Plan Note (Addendum)
Unclear cause, has improved with no further episodes of vomiting and no need for phenergan for the past week. Discussed care if symptoms return.

## 2013-01-13 ENCOUNTER — Ambulatory Visit (INDEPENDENT_AMBULATORY_CARE_PROVIDER_SITE_OTHER): Payer: Medicaid Other | Admitting: Family Medicine

## 2013-01-13 ENCOUNTER — Encounter: Payer: Self-pay | Admitting: Family Medicine

## 2013-01-13 VITALS — BP 136/92 | HR 92 | Temp 99.3°F | Ht 65.5 in | Wt 290.9 lb

## 2013-01-13 DIAGNOSIS — B9789 Other viral agents as the cause of diseases classified elsewhere: Secondary | ICD-10-CM

## 2013-01-13 DIAGNOSIS — B349 Viral infection, unspecified: Secondary | ICD-10-CM

## 2013-01-13 NOTE — Patient Instructions (Addendum)
Viremia Your exam shows you have a viral illness. Viremia means your symptoms are due to the presence of the virus in your blood. This will often cause a chill or sweat. Other common symptoms of viral infections include fever, muscle aches, headache, fatigue, stomach upsets, sore throat, and dry cough. Antibiotics are not effective in viral illnesses; they are usually only given when there is a secondary bacterial infection. General treatment includes bed rest, increasing oral fluid intake of clear, non-caffeinated drinks like ginger ale, fruit juices, water, or sports drinks. Medicines to relieve specific symptoms such as cough, pain, or diarrhea may also be prescribed. Only take over-the-counter or prescription medicines for pain, discomfort, or fever as directed by your caregiver.  Please call your doctor if you are not better after 2 to 3 days of symptom treatment. Call or return here right away if your illness gets more severe, or you develop any other new symptoms, such as a fever above 103 F (39.4 C), vomiting for more than a day, severe headache or other pain, stiff neck, trouble breathing, visual problems, "blackouts" or fainting. Document Released: 05/11/2004 Document Revised: 06/26/2011 Document Reviewed: 04/03/2005 [facility] Patient Information 2014 [place], [place].  Continue to drink plenty of fluids. Eat a bland diet. May take Motrin or Tylenol to help control the headache.

## 2013-01-13 NOTE — Assessment & Plan Note (Addendum)
Patient presents with multiple medical complaints which are likely sequelae of viral syndrome. -Conservative therapies including hydration, Tylenol and Motrin as needed for pain, and rest discussed with the patient.

## 2013-01-13 NOTE — Progress Notes (Addendum)
  Subjective:    Patient ID: Holly [name], female    DOB: 19-Apr-1980, 32 y.o.   MRN: [id number]  HPI 32 year old female presents with complaint of headache for the past 2 days. Patient states approximately one week ago she developed URI like symptoms including nasal congestion, sore throat, and sneezing. She thinks that she may have caught a virus from her brother. Patient states the symptoms gradually improved, however she did develop diarrhea approximately 5 days ago that lasted for 2 days, there was no blood in her stool, no associated abdominal pain or vomiting, diarrhea has since resolved, 2 days ago she developed left-sided headache, describes as throbbing in nature, she does have associated phonophobia and photophobia, subjective fevers and chills at home however she did not actually take her temperature. Patient has previously had migraines which were well-controlled with Motrin and Tylenol. Patient did take thousand milligrams of Tylenol this morning which did improve her headache pain. Pain is currently 8/10. Patient denies associated neurologic symptoms including numbness or tingling in her extremities.   Review of Systems  Constitutional: Positive for fever, chills and fatigue.  HENT: Negative for congestion, rhinorrhea and neck pain.   Respiratory: Negative for cough, choking and shortness of breath.   Gastrointestinal: Negative for nausea, vomiting and diarrhea.  Neurological: Positive for headaches. Negative for numbness.       Objective:   Physical Exam Vitals: Reviewed General: Pleasant African American female in no acute distress HEENT: Normocephalic, pupils are equal round and reactive to light, extra amounts are intact, no scleral icterus, no conjunctival pallor, moist mucous membranes, uvula midline, nasal septum midline, no rhinorrhea, neck was supple, no adenopathy, no thyromegaly,Bilateral TMs are pearly-gray without bulging or erythema Cardiac: Regular rate and rhythm,  S1 and S2 present, no murmurs Respiratory: Clear to auscultation bilaterally, normal effort Neuro: Cranial nerves II through XII are intact, normal gait, alert and oriented x3, strength was 5 out of 5 in all extremities        Assessment & Plan:  Please see problem specific assessment and plan.

## 2013-04-11 ENCOUNTER — Encounter (HOSPITAL_COMMUNITY): Payer: Self-pay | Admitting: *Deleted

## 2013-04-11 ENCOUNTER — Inpatient Hospital Stay (HOSPITAL_COMMUNITY)
Admission: AD | Admit: 2013-04-11 | Discharge: 2013-04-12 | Disposition: A | Discharge status: Home / Self Care | Payer: Self-pay | Source: Ambulatory Visit | Attending: Obstetrics & Gynecology | Admitting: Obstetrics & Gynecology

## 2013-04-11 DIAGNOSIS — K529 Noninfective gastroenteritis and colitis, unspecified: Secondary | ICD-10-CM

## 2013-04-11 DIAGNOSIS — R1110 Vomiting, unspecified: Secondary | ICD-10-CM | POA: Insufficient documentation

## 2013-04-11 DIAGNOSIS — K5289 Other specified noninfective gastroenteritis and colitis: Secondary | ICD-10-CM | POA: Insufficient documentation

## 2013-04-11 DIAGNOSIS — I10 Essential (primary) hypertension: Secondary | ICD-10-CM | POA: Insufficient documentation

## 2013-04-11 DIAGNOSIS — R197 Diarrhea, unspecified: Secondary | ICD-10-CM | POA: Insufficient documentation

## 2013-04-11 LAB — CBC WITH DIFFERENTIAL/PLATELET
Basophils Absolute: 0 10*3/uL (ref 0.0–0.1)
HCT: 41.2 % (ref 36.0–46.0)
Lymphocytes Relative: 30 % (ref 12–46)
Monocytes Absolute: 0.7 10*3/uL (ref 0.1–1.0)
Neutro Abs: 6.8 10*3/uL (ref 1.7–7.7)
Neutrophils Relative %: 62 % (ref 43–77)
Platelets: 353 10*3/uL (ref 150–400)
RDW: 13.9 % (ref 11.5–15.5)
WBC: 10.9 10*3/uL — ABNORMAL HIGH (ref 4.0–10.5)

## 2013-04-11 LAB — URINALYSIS, ROUTINE W REFLEX MICROSCOPIC
Glucose, UA: NEGATIVE mg/dL
Protein, ur: NEGATIVE mg/dL
pH: 6 (ref 5.0–8.0)

## 2013-04-11 LAB — URINE MICROSCOPIC-ADD ON

## 2013-04-11 LAB — POCT PREGNANCY, URINE: Preg Test, Ur: NEGATIVE

## 2013-04-11 MED ORDER — ONDANSETRON 8 MG PO TBDP
8.0000 mg | ORAL_TABLET | Freq: Once | ORAL | Status: AC
  Administered 2013-04-11: 8 mg via ORAL
  Filled 2013-04-11: qty 1

## 2013-04-11 NOTE — MAU Note (Signed)
My blood pressure was high at home and i've been light-headed. Unable to keep down food today and diarrhea.

## 2013-04-11 NOTE — MAU Provider Note (Signed)
Chief Complaint: Hypertension, Emesis and Diarrhea   First Provider Initiated Contact with Patient 04/11/13 2337      SUBJECTIVE HPI: Holly [name] is a 32 y.o. [id number] female who presents with vomiting 4-5 times since this morning, diarrhea 3 times since this morning at high blood pressure. Has history of pregnancy-induced hypertension, but no hypertension onset of pregnancy. Denies fever, chills, chest pain, shortness of breath, blood in stools, urinary complaints or sick contacts. Able to keep down water. Has not taken any antiemetics. Took Tylenol cold and flu.  Past Medical History  Diagnosis Date  . Pregnancy   . Pregnancy induced hypertension   . Eczema     Hx: of  . Seasonal allergies     Hx: of   OB History  Gravida Para Term Preterm AB SAB TAB Ectopic Multiple Living  3 2 2  1 1    2     # Outcome Date GA Lbr Len/2nd Weight Sex Delivery Anes PTL Lv  3 TRM 05/28/12 [redacted]w[redacted]d 02:28 / 00:15 3.345 kg (7 lb 6 oz) M VBAC EPI  Y  2 TRM 03/27/06 [redacted]w[redacted]d  1.956 kg (4 lb 5 oz) F LTCS EPI  Y     Comments: PIH, losing fluid  1 SAB              Past Surgical History  Procedure Laterality Date  . No past surgeries    . C-sectionx1    . Cesarean section    . Upper gi endoscopy      Hx: of  . Cholecystectomy N/A 08/30/2012    Procedure: LAPAROSCOPIC CHOLECYSTECTOMY WITH INTRAOPERATIVE CHOLANGIOGRAM;  Surgeon: Shelly Rubenstein, MD;  Location: [facility] OR;  Service: General;  Laterality: N/A;   History   Social History  . Marital Status: Single    Spouse Name: N/A    Number of Children: 1  . Years of Education: N/A   Occupational History  . 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 Smoking status: Never Smoker   . Smokeless tobacco: Never Used  . Alcohol Use: No  . Drug Use: No  . Sexual Activity: Yes    Birth Control/ Protection: IUD   Other Topics Concern  . Not on file   Social History Narrative  . No narrative on file   No current facility-administered  medications on file prior to encounter.   Current Outpatient Prescriptions on File Prior to Encounter  Medication Sig Dispense Refill  . Prenatal Vit-Fe Fumarate-FA (PRENATAL MULTIVITAMIN) TABS Take 1 tablet by mouth daily.       Allergies  Allergen Reactions  . Naproxen Sodium     Chest burns     ROS: Pertinent items in HPI  OBJECTIVE Blood pressure 132/71, pulse 86, temperature 98.8 F (37.1 C), resp. rate 18, height 5\' 5"  (1.651 m), weight 136.896 kg (301 lb 12.8 oz). Patient Vitals for the past 24 hrs:  BP Temp Pulse Resp Height Weight  04/12/13 0052 132/71 mmHg 98.8 F (37.1 C) 86 18 - -  04/11/13 2348 143/91 mmHg - 84 - - -  04/11/13 2333 144/95 mmHg - 83 - - -  04/11/13 2327 125/83 mmHg - 85 - - -  04/11/13 2323 132/79 mmHg - 87 - - -  04/11/13 1912 146/91 mmHg 99 F (37.2 C) 79 20 5\' 5"  (1.651 m) 136.896 kg (301 lb 12.8 oz)   GENERAL: Well-developed, obese, well-nourished female in no acute distress.  HEENT: Normocephalic. Meters  membranes moist. HEART: normal rate and rhythm no murmurs rubs or gallops. RESP: normal effort ABDOMEN: Soft, non-tender. No CVA tenderness. EXTREMITIES: Nontender, no edema NEURO: Alert and oriented SPECULUM EXAM: Deferred  LAB RESULTS Results for orders placed during the hospital encounter of 04/11/13 (from the past 24 hour(s))  URINALYSIS, ROUTINE W REFLEX MICROSCOPIC     Status: Abnormal   Collection Time    04/11/13  7:17 PM      Result Value Range   Color, Urine YELLOW  YELLOW   APPearance CLEAR  CLEAR   Specific Gravity, Urine 1.025  1.005 - 1.030   pH 6.0  5.0 - 8.0   Glucose, UA NEGATIVE  NEGATIVE mg/dL   Hgb urine dipstick MODERATE (*) NEGATIVE   Bilirubin Urine NEGATIVE  NEGATIVE   Ketones, ur NEGATIVE  NEGATIVE mg/dL   Protein, ur NEGATIVE  NEGATIVE mg/dL   Urobilinogen, UA 0.2  0.0 - 1.0 mg/dL   Nitrite NEGATIVE  NEGATIVE   Leukocytes, UA SMALL (*) NEGATIVE  URINE MICROSCOPIC-ADD ON     Status: Abnormal    Collection Time    04/11/13  7:17 PM      Result Value Range   Squamous Epithelial / LPF MANY (*) RARE   WBC, UA 7-10  <3 WBC/hpf   RBC / HPF 11-20  <3 RBC/hpf   Bacteria, UA FEW (*) RARE  POCT PREGNANCY, URINE     Status: None   Collection Time    04/11/13  7:37 PM      Result Value Range   Preg Test, Ur NEGATIVE  NEGATIVE  CBC WITH DIFFERENTIAL     Status: Abnormal   Collection Time    04/11/13  8:19 PM      Result Value Range   WBC 10.9 (*) 4.0 - 10.5 K/uL   RBC 4.79  3.87 - 5.11 MIL/uL   Hemoglobin 13.4  12.0 - 15.0 g/dL   HCT 65.7  84.6 - 96.2 %   MCV 86.0  78.0 - 100.0 fL   MCH 28.0  26.0 - 34.0 pg   MCHC 32.5  30.0 - 36.0 g/dL   RDW 95.2  84.1 - 32.4 %   Platelets 353  150 - 400 K/uL   Neutrophils Relative % 62  43 - 77 %   Neutro Abs 6.8  1.7 - 7.7 K/uL   Lymphocytes Relative 30  12 - 46 %   Lymphs Abs 3.3  0.7 - 4.0 K/uL   Monocytes Relative 6  3 - 12 %   Monocytes Absolute 0.7  0.1 - 1.0 K/uL   Eosinophils Relative 2  0 - 5 %   Eosinophils Absolute 0.2  0.0 - 0.7 K/uL   Basophils Relative 0  0 - 1 %   Basophils Absolute 0.0  0.0 - 0.1 K/uL    IMAGING No results found.  MAU COURSE Zofran, CBC.  Partial resolution of nausea. Keeping down water. Reglan ordered.  Nausea resolved. Able to keep down sprite. No vomiting or diarrhea during MAU visit   ASSESSMENT 1. Gastroenteritis, infectious, presumed   2. Hypertension-possibly medication related     PLAN Discharge home in stable condition. Liquid diet x24 hours, then advance slowly. Check blood pressure daily and bring log to primary care provider. Discussed risks of uncontrolled hypertension.     Follow-up Information   Follow up with HONIG, ERIN, MD. (Regarding high blood pressure or as needed if nausea vomiting and diarrhea continue for more than 3 days)  Specialty:  Family Medicine   Contact information:   [address] [place] [address] [phone]       Follow up with [facility].  (As needed in emergencies)    Contact information:   [address] [place] [address]        Medication List         loperamide 2 MG tablet  Commonly known as:  IMODIUM A-D  Take 1 tablet (2 mg total) by mouth 4 (four) times daily as needed for diarrhea or loose stools.     metoCLOPramide 10 MG tablet  Commonly known as:  REGLAN  Take 1 tablet (10 mg total) by mouth every 6 (six) hours as needed for nausea.     prenatal multivitamin Tabs tablet  Take 1 tablet by mouth daily.       [place], CNM 04/12/2013  1:32 AM

## 2013-04-12 DIAGNOSIS — K529 Noninfective gastroenteritis and colitis, unspecified: Secondary | ICD-10-CM

## 2013-04-12 MED ORDER — METOCLOPRAMIDE HCL 10 MG PO TABS
10.0000 mg | ORAL_TABLET | Freq: Once | ORAL | Status: AC
  Administered 2013-04-12: 10 mg via ORAL
  Filled 2013-04-12: qty 1

## 2013-04-12 MED ORDER — METOCLOPRAMIDE HCL 10 MG PO TABS: 10.0000 mg | ORAL_TABLET | Freq: Four times a day (QID) | ORAL | Status: DC | PRN

## 2013-04-12 MED ORDER — LOPERAMIDE HCL 2 MG PO TABS: 2.0000 mg | ORAL_TABLET | Freq: Four times a day (QID) | ORAL | Status: DC | PRN

## 2013-04-12 NOTE — Progress Notes (Signed)
Holly [name] CNM in earlier to discuss d/c plan. Written and verbal d/c instructions given and understanding voiced. 

## 2013-04-13 LAB — URINE CULTURE

## 2013-04-14 NOTE — MAU Provider Note (Signed)
Attestation of Attending Supervision of Advanced Practitioner (CNM/NP): Evaluation and management procedures were performed by the Advanced Practitioner under my supervision and collaboration.  I have reviewed the Advanced Practitioner's note and chart, and I agree with the management and plan.  HARRAWAY-SMITH, Daphnee Preiss 4:34 PM     

## 2013-04-18 ENCOUNTER — Encounter (HOSPITAL_COMMUNITY): Payer: Self-pay | Admitting: Emergency Medicine

## 2013-04-18 ENCOUNTER — Emergency Department (HOSPITAL_COMMUNITY)
Admission: EM | Admit: 2013-04-18 | Discharge: 2013-04-18 | Disposition: A | Discharge status: Home / Self Care | Payer: Self-pay | Attending: Emergency Medicine | Admitting: Emergency Medicine

## 2013-04-18 ENCOUNTER — Emergency Department (HOSPITAL_COMMUNITY): Payer: Self-pay

## 2013-04-18 DIAGNOSIS — H6120 Impacted cerumen, unspecified ear: Secondary | ICD-10-CM | POA: Insufficient documentation

## 2013-04-18 DIAGNOSIS — M542 Cervicalgia: Secondary | ICD-10-CM | POA: Insufficient documentation

## 2013-04-18 DIAGNOSIS — R110 Nausea: Secondary | ICD-10-CM | POA: Insufficient documentation

## 2013-04-18 DIAGNOSIS — R42 Dizziness and giddiness: Secondary | ICD-10-CM | POA: Insufficient documentation

## 2013-04-18 DIAGNOSIS — H53149 Visual discomfort, unspecified: Secondary | ICD-10-CM | POA: Insufficient documentation

## 2013-04-18 DIAGNOSIS — J029 Acute pharyngitis, unspecified: Secondary | ICD-10-CM | POA: Insufficient documentation

## 2013-04-18 DIAGNOSIS — R197 Diarrhea, unspecified: Secondary | ICD-10-CM | POA: Insufficient documentation

## 2013-04-18 DIAGNOSIS — Z872 Personal history of diseases of the skin and subcutaneous tissue: Secondary | ICD-10-CM | POA: Insufficient documentation

## 2013-04-18 DIAGNOSIS — R5383 Other fatigue: Secondary | ICD-10-CM | POA: Insufficient documentation

## 2013-04-18 DIAGNOSIS — N390 Urinary tract infection, site not specified: Secondary | ICD-10-CM | POA: Insufficient documentation

## 2013-04-18 DIAGNOSIS — H9209 Otalgia, unspecified ear: Secondary | ICD-10-CM | POA: Insufficient documentation

## 2013-04-18 DIAGNOSIS — R5381 Other malaise: Secondary | ICD-10-CM | POA: Insufficient documentation

## 2013-04-18 DIAGNOSIS — Z9089 Acquired absence of other organs: Secondary | ICD-10-CM | POA: Insufficient documentation

## 2013-04-18 DIAGNOSIS — R51 Headache: Secondary | ICD-10-CM | POA: Insufficient documentation

## 2013-04-18 DIAGNOSIS — Z3202 Encounter for pregnancy test, result negative: Secondary | ICD-10-CM | POA: Insufficient documentation

## 2013-04-18 LAB — COMPREHENSIVE METABOLIC PANEL
ALT: 35 U/L (ref 0–35)
AST: 25 U/L (ref 0–37)
Albumin: 3.6 g/dL (ref 3.5–5.2)
Alkaline Phosphatase: 87 U/L (ref 39–117)
BUN: 8 mg/dL (ref 6–23)
CALCIUM: 9.2 mg/dL (ref 8.4–10.5)
CO2: 27 meq/L (ref 19–32)
CREATININE: 0.61 mg/dL (ref 0.50–1.10)
Chloride: 100 mEq/L (ref 96–112)
GFR calc Af Amer: >90 mL/min (ref >90)
Glucose, Bld: 111 mg/dL — ABNORMAL HIGH (ref 70–99)
Potassium: 4.3 mEq/L (ref 3.7–5.3)
Sodium: 138 mEq/L (ref 137–147)
Total Bilirubin: 0.5 mg/dL (ref 0.3–1.2)
Total Protein: 7.5 g/dL (ref 6.0–8.3)

## 2013-04-18 LAB — CBC WITH DIFFERENTIAL/PLATELET
BASOS ABS: 0 10*3/uL (ref 0.0–0.1)
BASOS PCT: 0 % (ref 0–1)
EOS PCT: 2 % (ref 0–5)
Eosinophils Absolute: 0.2 10*3/uL (ref 0.0–0.7)
HEMATOCRIT: 40.3 % (ref 36.0–46.0)
HEMOGLOBIN: 12.8 g/dL (ref 12.0–15.0)
LYMPHS PCT: 29 % (ref 12–46)
Lymphs Abs: 3.3 10*3/uL (ref 0.7–4.0)
MCH: 27.9 pg (ref 26.0–34.0)
MCHC: 31.8 g/dL (ref 30.0–36.0)
MCV: 88 fL (ref 78.0–100.0)
MONO ABS: 0.6 10*3/uL (ref 0.1–1.0)
MONOS PCT: 5 % (ref 3–12)
Neutro Abs: 7.2 10*3/uL (ref 1.7–7.7)
Neutrophils Relative %: 64 % (ref 43–77)
Platelets: 352 10*3/uL (ref 150–400)
RBC: 4.58 MIL/uL (ref 3.87–5.11)
RDW: 13.7 % (ref 11.5–15.5)
WBC: 11.2 10*3/uL — ABNORMAL HIGH (ref 4.0–10.5)

## 2013-04-18 LAB — URINE MICROSCOPIC-ADD ON

## 2013-04-18 LAB — URINALYSIS, ROUTINE W REFLEX MICROSCOPIC
BILIRUBIN URINE: NEGATIVE
GLUCOSE, UA: NEGATIVE mg/dL
HGB URINE DIPSTICK: NEGATIVE
Ketones, ur: NEGATIVE mg/dL
Nitrite: NEGATIVE
PH: 7.5 (ref 5.0–8.0)
PROTEIN: NEGATIVE mg/dL
SPECIFIC GRAVITY, URINE: 1.022 (ref 1.005–1.030)
UROBILINOGEN UA: 1 mg/dL (ref 0.0–1.0)

## 2013-04-18 LAB — RAPID STREP SCREEN (MED CTR MEBANE ONLY): Streptococcus, Group A Screen (Direct): NEGATIVE

## 2013-04-18 LAB — PREGNANCY, URINE: Preg Test, Ur: NEGATIVE

## 2013-04-18 LAB — LIPASE, BLOOD: LIPASE: 39 U/L (ref 11–59)

## 2013-04-18 MED ORDER — CEPHALEXIN 500 MG PO CAPS: 500.0000 mg | ORAL_CAPSULE | Freq: Two times a day (BID) | ORAL | Status: DC

## 2013-04-18 MED ORDER — SODIUM CHLORIDE 0.9 % IV BOLUS (SEPSIS)
1000.0000 mL | Freq: Once | INTRAVENOUS | Status: AC
  Administered 2013-04-18: 1000 mL via INTRAVENOUS

## 2013-04-18 MED ORDER — ACETAMINOPHEN 325 MG PO TABS
650.0000 mg | ORAL_TABLET | Freq: Once | ORAL | Status: AC
  Administered 2013-04-18: 650 mg via ORAL
  Filled 2013-04-18: qty 2

## 2013-04-18 MED ORDER — ONDANSETRON 4 MG PO TBDP: 4.0000 mg | ORAL_TABLET | Freq: Three times a day (TID) | ORAL | Status: DC | PRN

## 2013-04-18 MED ORDER — ONDANSETRON HCL 4 MG/2ML IJ SOLN
4.0000 mg | Freq: Once | INTRAMUSCULAR | Status: AC
  Administered 2013-04-18: 4 mg via INTRAVENOUS
  Filled 2013-04-18: qty 2

## 2013-04-18 NOTE — ED Notes (Signed)
Pt c/o dizziness, lightheadedness, nausea since last week. Pt reports having diarrhea today. Pt denies vomiting. Pt also c/o sore throat x 2 days. Pt states it hurts to swallow anything. Pt was seen at [facility] for same last week. Pt with no acute distress.

## 2013-04-18 NOTE — Discharge Instructions (Signed)
Drink plenty of fluids and rest  Return to the ED if you develop a severe headache, difficulty swallowing/breathing, coughing up blood, fever, stiff neck, change/worsening abdominal pain, blood in your vomit/urine/stool, fainting, chest pain, shortness of breath, or any other concerns (see below)    Urinary Tract Infection Urinary tract infections (UTIs) can develop anywhere along your urinary tract. Your urinary tract is your body's drainage system for removing wastes and extra water. Your urinary tract includes two kidneys, two ureters, a bladder, and a urethra. Your kidneys are a pair of bean-shaped organs. Each kidney is about the size of your fist. They are located below your ribs, one on each side of your spine. CAUSES Infections are caused by microbes, which are microscopic organisms, including fungi, viruses, and bacteria. These organisms are so small that they can only be seen through a microscope. Bacteria are the microbes that most commonly cause UTIs. SYMPTOMS  Symptoms of UTIs may vary by age and gender of the patient and by the location of the infection. Symptoms in young women typically include a frequent and intense urge to urinate and a painful, burning feeling in the bladder or urethra during urination. Older women and men are more likely to be tired, shaky, and weak and have muscle aches and abdominal pain. A fever may mean the infection is in your kidneys. Other symptoms of a kidney infection include pain in your back or sides below the ribs, nausea, and vomiting. DIAGNOSIS To diagnose a UTI, your caregiver will ask you about your symptoms. Your caregiver also will ask to provide a urine sample. The urine sample will be tested for bacteria and white blood cells. White blood cells are made by your body to help fight infection. TREATMENT  Typically, UTIs can be treated with medication. Because most UTIs are caused by a bacterial infection, they usually can be treated with the use of  antibiotics. The choice of antibiotic and length of treatment depend on your symptoms and the type of bacteria causing your infection. HOME CARE INSTRUCTIONS  If you were prescribed antibiotics, take them exactly as your caregiver instructs you. Finish the medication even if you feel better after you have only taken some of the medication.  Drink enough water and fluids to keep your urine clear or pale yellow.  Avoid caffeine, tea, and carbonated beverages. They tend to irritate your bladder.  Empty your bladder often. Avoid holding urine for long periods of time.  Empty your bladder before and after sexual intercourse.  After a bowel movement, women should cleanse from front to back. Use each tissue only once. SEEK MEDICAL CARE IF:   You have back pain.  You develop a fever.  Your symptoms do not begin to resolve within 3 days. SEEK IMMEDIATE MEDICAL CARE IF:   You have severe back pain or lower abdominal pain.  You develop chills.  You have nausea or vomiting.  You have continued burning or discomfort with urination. MAKE SURE YOU:   Understand these instructions.  Will watch your condition.  Will get help right away if you are not doing well or get worse. Document Released: 01/11/2005 Document Revised: 10/03/2011 Document Reviewed: 05/12/2011 [facility] Patient Information 2014 [place], [place].  Sore Throat A sore throat is pain, burning, irritation, or scratchiness of the throat. There is often pain or tenderness when swallowing or talking. A sore throat may be accompanied by other symptoms, such as coughing, sneezing, fever, and swollen neck glands. A sore throat is often the  first sign of another sickness, such as a cold, flu, strep throat, or mononucleosis (commonly known as mono). Most sore throats go away without medical treatment. CAUSES  The most common causes of a sore throat include:  A viral infection, such as a cold, flu, or mono.  A bacterial infection,  such as strep throat, tonsillitis, or whooping cough.  Seasonal allergies.  Dryness in the air.  Irritants, such as smoke or pollution.  Gastroesophageal reflux disease (GERD). HOME CARE INSTRUCTIONS   Only take over-the-counter medicines as directed by your caregiver.  Drink enough fluids to keep your urine clear or pale yellow.  Rest as needed.  Try using throat sprays, lozenges, or sucking on hard candy to ease any pain (if older than 4 years or as directed).  Sip warm liquids, such as broth, herbal tea, or warm water with honey to relieve pain temporarily. You may also eat or drink cold or frozen liquids such as frozen ice pops.  Gargle with salt water (mix 1 tsp salt with 8 oz of water).  Do not smoke and avoid secondhand smoke.  Put a cool-mist humidifier in your bedroom at night to moisten the air. You can also turn on a hot shower and sit in the bathroom with the door closed for 5 10 minutes. SEEK IMMEDIATE MEDICAL CARE IF:  You have difficulty breathing.  You are unable to swallow fluids, soft foods, or your saliva.  You have increased swelling in the throat.  Your sore throat does not get better in 7 days.  You have nausea and vomiting.  You have a fever or persistent symptoms for more than 2 3 days.  You have a fever and your symptoms suddenly get worse. MAKE SURE YOU:   Understand these instructions.  Will watch your condition.  Will get help right away if you are not doing well or get worse. Document Released: 05/11/2004 Document Revised: 03/20/2012 Document Reviewed: 12/10/2011 [facility] Patient Information 2014 [place], [place].  Headaches, Frequently Asked Questions MIGRAINE HEADACHES Q: What is migraine? What causes it? How can I treat it? A: Generally, migraine headaches begin as a dull ache. Then they develop into a constant, throbbing, and pulsating pain. You may experience pain at the temples. You may experience pain at the front or back of one  or both sides of the head. The pain is usually accompanied by a combination of: Nausea. Vomiting. Sensitivity to light and noise. Some people (about 15%) experience an aura (see below) before an attack. The cause of migraine is believed to be chemical reactions in the brain. Treatment for migraine may include over-the-counter or prescription medications. It may also include self-help techniques. These include relaxation training and biofeedback.  Q: What is an aura? A: About 15% of people with migraine get an "aura". This is a sign of neurological symptoms that occur before a migraine headache. You may see wavy or jagged lines, dots, or flashing lights. You might experience tunnel vision or blind spots in one or both eyes. The aura can include visual or auditory hallucinations (something imagined). It may include disruptions in smell (such as strange odors), taste or touch. Other symptoms include: Numbness. A "pins and needles" sensation. Difficulty in recalling or speaking the correct word. These neurological events may last as long as 60 minutes. These symptoms will fade as the headache begins. Q: What is a trigger? A: Certain physical or environmental factors can lead to or "trigger" a migraine. These include: Foods. Hormonal changes. Weather. Stress.  It is important to remember that triggers are different for everyone. To help prevent migraine attacks, you need to figure out which triggers affect you. Keep a headache diary. This is a good way to track triggers. The diary will help you talk to your healthcare professional about your condition. Q: Does weather affect migraines? A: Bright sunshine, hot, humid conditions, and drastic changes in barometric pressure may lead to, or "trigger," a migraine attack in some people. But studies have shown that weather does not act as a trigger for everyone with migraines. Q: What is the link between migraine and hormones? A: Hormones start and regulate  many of your body's functions. Hormones keep your body in balance within a constantly changing environment. The levels of hormones in your body are unbalanced at times. Examples are during menstruation, pregnancy, or menopause. That can lead to a migraine attack. In fact, about three quarters of all women with migraine report that their attacks are related to the menstrual cycle.  Q: Is there an increased risk of stroke for migraine sufferers? A: The likelihood of a migraine attack causing a stroke is very remote. That is not to say that migraine sufferers cannot have a stroke associated with their migraines. In persons under age 91, the most common associated factor for stroke is migraine headache. But over the course of a person's normal life span, the occurrence of migraine headache may actually be associated with a reduced risk of dying from cerebrovascular disease due to stroke.  Q: What are acute medications for migraine? A: Acute medications are used to treat the pain of the headache after it has started. Examples over-the-counter medications, NSAIDs, ergots, and triptans.  Q: What are the triptans? A: Triptans are the newest class of abortive medications. They are specifically targeted to treat migraine. Triptans are vasoconstrictors. They moderate some chemical reactions in the brain. The triptans work on receptors in your brain. Triptans help to restore the balance of a neurotransmitter called serotonin. Fluctuations in levels of serotonin are thought to be a main cause of migraine.  Q: Are over-the-counter medications for migraine effective? A: Over-the-counter, or "OTC," medications may be effective in relieving mild to moderate pain and associated symptoms of migraine. But you should see your caregiver before beginning any treatment regimen for migraine.  Q: What are preventive medications for migraine? A: Preventive medications for migraine are sometimes referred to as "prophylactic"  treatments. They are used to reduce the frequency, severity, and length of migraine attacks. Examples of preventive medications include antiepileptic medications, antidepressants, beta-blockers, calcium channel blockers, and NSAIDs (nonsteroidal anti-inflammatory drugs). Q: Why are anticonvulsants used to treat migraine? A: During the past few years, there has been an increased interest in antiepileptic drugs for the prevention of migraine. They are sometimes referred to as "anticonvulsants". Both epilepsy and migraine may be caused by similar reactions in the brain.  Q: Why are antidepressants used to treat migraine? A: Antidepressants are typically used to treat people with depression. They may reduce migraine frequency by regulating chemical levels, such as serotonin, in the brain.  Q: What alternative therapies are used to treat migraine? A: The term "alternative therapies" is often used to describe treatments considered outside the scope of conventional Western medicine. Examples of alternative therapy include acupuncture, acupressure, and yoga. Another common alternative treatment is herbal therapy. Some herbs are believed to relieve headache pain. Always discuss alternative therapies with your caregiver before proceeding. Some herbal products contain arsenic and other toxins. TENSION HEADACHES Q:  What is a tension-type headache? What causes it? How can I treat it? A: Tension-type headaches occur randomly. They are often the result of temporary stress, anxiety, fatigue, or anger. Symptoms include soreness in your temples, a tightening band-like sensation around your head (a "vice-like" ache). Symptoms can also include a pulling feeling, pressure sensations, and contracting head and neck muscles. The headache begins in your forehead, temples, or the back of your head and neck. Treatment for tension-type headache may include over-the-counter or prescription medications. Treatment may also include  self-help techniques such as relaxation training and biofeedback. CLUSTER HEADACHES Q: What is a cluster headache? What causes it? How can I treat it? A: Cluster headache gets its name because the attacks come in groups. The pain arrives with little, if any, warning. It is usually on one side of the head. A tearing or bloodshot eye and a runny nose on the same side of the headache may also accompany the pain. Cluster headaches are believed to be caused by chemical reactions in the brain. They have been described as the most severe and intense of any headache type. Treatment for cluster headache includes prescription medication and oxygen. SINUS HEADACHES Q: What is a sinus headache? What causes it? How can I treat it? A: When a cavity in the bones of the face and skull (a sinus) becomes inflamed, the inflammation will cause localized pain. This condition is usually the result of an allergic reaction, a tumor, or an infection. If your headache is caused by a sinus blockage, such as an infection, you will probably have a fever. An x-ray will confirm a sinus blockage. Your caregiver's treatment might include antibiotics for the infection, as well as antihistamines or decongestants.  REBOUND HEADACHES Q: What is a rebound headache? What causes it? How can I treat it? A: A pattern of taking acute headache medications too often can lead to a condition known as "rebound headache." A pattern of taking too much headache medication includes taking it more than 2 days per week or in excessive amounts. That means more than the label or a caregiver advises. With rebound headaches, your medications not only stop relieving pain, they actually begin to cause headaches. Doctors treat rebound headache by tapering the medication that is being overused. Sometimes your caregiver will gradually substitute a different type of treatment or medication. Stopping may be a challenge. Regularly overusing a medication increases the  potential for serious side effects. Consult a caregiver if you regularly use headache medications more than 2 days per week or more than the label advises. ADDITIONAL QUESTIONS AND ANSWERS Q: What is biofeedback? A: Biofeedback is a self-help treatment. Biofeedback uses special equipment to monitor your body's involuntary physical responses. Biofeedback monitors: Breathing. Pulse. Heart rate. Temperature. Muscle tension. Brain activity. Biofeedback helps you refine and perfect your relaxation exercises. You learn to control the physical responses that are related to stress. Once the technique has been mastered, you do not need the equipment any more. Q: Are headaches hereditary? A: Four out of five (80%) of people that suffer report a family history of migraine. Scientists are not sure if this is genetic or a family predisposition. Despite the uncertainty, a child has a 50% chance of having migraine if one parent suffers. The child has a 75% chance if both parents suffer.  Q: Can children get headaches? A: By the time they reach high school, most young people have experienced some type of headache. Many safe and effective approaches or medications  can prevent a headache from occurring or stop it after it has begun.  Q: What type of doctor should I see to diagnose and treat my headache? A: Start with your primary caregiver. Discuss his or her experience and approach to headaches. Discuss methods of classification, diagnosis, and treatment. Your caregiver may decide to recommend you to a headache specialist, depending upon your symptoms or other physical conditions. Having diabetes, allergies, etc., may require a more comprehensive and inclusive approach to your headache. The National Head[employer] Foundation will provide, upon request, a list of [facility] physician members in your state. Document Released: 06/24/2003 Document Revised: 06/26/2011 Document Reviewed: 12/02/2007 [facility] Patient Information 2014  [place], [place].  Dizziness Dizziness is a common problem. It is a feeling of unsteadiness or lightheadedness. You may feel like you are about to faint. Dizziness can lead to injury if you stumble or fall. A person of any age group can suffer from dizziness, but dizziness is more common in older adults. CAUSES  Dizziness can be caused by many different things, including:  Middle ear problems.  Standing for too long.  Infections.  An allergic reaction.  Aging.  An emotional response to something, such as the sight of blood.  Side effects of medicines.  Fatigue.  Problems with circulation or blood pressure.  Excess use of alcohol, medicines, or illegal drug use.  Breathing too fast (hyperventilation).  An arrhythmia or problems with your heart rhythm.  Low red blood cell count (anemia).  Pregnancy.  Vomiting, diarrhea, fever, or other illnesses that cause dehydration.  Diseases or conditions such as Parkinson's disease, high blood pressure (hypertension), diabetes, and thyroid problems.  Exposure to extreme heat. DIAGNOSIS  To find the cause of your dizziness, your caregiver may do a physical exam, lab tests, radiologic imaging scans, or an electrocardiography test (ECG).  TREATMENT  Treatment of dizziness depends on the cause of your symptoms and can vary greatly. HOME CARE INSTRUCTIONS   Drink enough fluids to keep your urine clear or pale yellow. This is especially important in very hot weather. In the elderly, it is also important in cold weather.  If your dizziness is caused by medicines, take them exactly as directed. When taking blood pressure medicines, it is especially important to get up slowly.  Rise slowly from chairs and steady yourself until you feel okay.  In the morning, first sit up on the side of the bed. When this seems okay, stand slowly while holding onto something until you know your balance is fine.  If you need to stand in one place for a long  time, be sure to move your legs often. Tighten and relax the muscles in your legs while standing.  If dizziness continues to be a problem, have someone stay with you for a day or two. Do this until you feel you are well enough to stay alone. Have the person call your caregiver if he or she notices changes in you that are concerning.  Do not drive or use heavy machinery if you feel dizzy.  Do not drink alcohol. SEEK IMMEDIATE MEDICAL CARE IF:   Your dizziness or lightheadedness gets worse.  You feel nauseous or vomit.  You develop problems with talking, walking, weakness, or using your arms, hands, or legs.  You are not thinking clearly or you have difficulty forming sentences. It may take a friend or family member to determine if your thinking is normal.  You develop chest pain, abdominal pain, shortness of breath, or sweating.  Your vision changes.  You notice any bleeding.  You have side effects from medicine that seems to be getting worse rather than better. MAKE SURE YOU:   Understand these instructions.  Will watch your condition.  Will get help right away if you are not doing well or get worse. Document Released: 09/27/2000 Document Revised: 06/26/2011 Document Reviewed: 10/21/2010 [facility] Patient Information 2014 [place], [place].  Diarrhea Diarrhea is frequent loose and watery bowel movements. It can cause you to feel weak and dehydrated. Dehydration can cause you to become tired and thirsty, have a dry mouth, and have decreased urination that often is dark yellow. Diarrhea is a sign of another problem, most often an infection that will not last long. In most cases, diarrhea typically lasts 2 3 days. However, it can last longer if it is a sign of something more serious. It is important to treat your diarrhea as directed by your caregive to lessen or prevent future episodes of diarrhea. CAUSES  Some common causes include:  Gastrointestinal infections caused by viruses,  bacteria, or parasites.  Food poisoning or food allergies.  Certain medicines, such as antibiotics, chemotherapy, and laxatives.  Artificial sweeteners and fructose.  Digestive disorders. HOME CARE INSTRUCTIONS  Ensure adequate fluid intake (hydration): have 1 cup (8 oz) of fluid for each diarrhea episode. Avoid fluids that contain simple sugars or sports drinks, fruit juices, whole milk products, and sodas. Your urine should be clear or pale yellow if you are drinking enough fluids. Hydrate with an oral rehydration solution that you can purchase at pharmacies, retail stores, and online. You can prepare an oral rehydration solution at home by mixing the following ingredients together:    tsp table salt.   tsp baking soda.   tsp salt substitute containing potassium chloride.  1  tablespoons sugar.  1 L (34 oz) of water.  Certain foods and beverages may increase the speed at which food moves through the gastrointestinal (GI) tract. These foods and beverages should be avoided and include:  Caffeinated and alcoholic beverages.  High-fiber foods, such as raw fruits and vegetables, nuts, seeds, and whole grain breads and cereals.  Foods and beverages sweetened with sugar alcohols, such as xylitol, sorbitol, and mannitol.  Some foods may be well tolerated and may help thicken stool including:  Starchy foods, such as rice, toast, pasta, low-sugar cereal, oatmeal, grits, baked potatoes, crackers, and bagels.  Bananas.  Applesauce.  Add probiotic-rich foods to help increase healthy bacteria in the GI tract, such as yogurt and fermented milk products.  Wash your hands well after each diarrhea episode.  Only take over-the-counter or prescription medicines as directed by your caregiver.  Take a warm bath to relieve any burning or pain from frequent diarrhea episodes. SEEK IMMEDIATE MEDICAL CARE IF:   You are unable to keep fluids down.  You have persistent vomiting.  You have  blood in your stool, or your stools are black and tarry.  You do not urinate in 6 8 hours, or there is only a small amount of very dark urine.  You have abdominal pain that increases or localizes.  You have weakness, dizziness, confusion, or lightheadedness.  You have a severe headache.  Your diarrhea gets worse or does not get better.  You have a fever or persistent symptoms for more than 2 3 days.  You have a fever and your symptoms suddenly get worse. MAKE SURE YOU:   Understand these instructions.  Will watch your condition.  Will get help  right away if you are not doing well or get worse. Document Released: 03/24/2002 Document Revised: 03/20/2012 Document Reviewed: 12/10/2011 [facility] Patient Information 2014 [place], [place].   Cough, Adult  A cough is a reflex that helps clear your throat and airways. It can help heal the body or may be a reaction to an irritated airway. A cough may only last 2 or 3 weeks (acute) or may last more than 8 weeks (chronic).  CAUSES Acute cough:  Viral or bacterial infections. Chronic cough:  Infections.  Allergies.  Asthma.  Post-nasal drip.  Smoking.  Heartburn or acid reflux.  Some medicines.  Chronic lung problems (COPD).  Cancer. SYMPTOMS   Cough.  Fever.  Chest pain.  Increased breathing rate.  High-pitched whistling sound when breathing (wheezing).  Colored mucus that you cough up (sputum). TREATMENT   A bacterial cough may be treated with antibiotic medicine.  A viral cough must run its course and will not respond to antibiotics.  Your caregiver may recommend other treatments if you have a chronic cough. HOME CARE INSTRUCTIONS   Only take over-the-counter or prescription medicines for pain, discomfort, or fever as directed by your caregiver. Use cough suppressants only as directed by your caregiver.  Use a cold steam vaporizer or humidifier in your bedroom or home to help loosen secretions.  Sleep  in a semi-upright position if your cough is worse at night.  Rest as needed.  Stop smoking if you smoke. SEEK IMMEDIATE MEDICAL CARE IF:   You have pus in your sputum.  Your cough starts to worsen.  You cannot control your cough with suppressants and are losing sleep.  You begin coughing up blood.  You have difficulty breathing.  You develop pain which is getting worse or is uncontrolled with medicine.  You have a fever. MAKE SURE YOU:   Understand these instructions.  Will watch your condition.  Will get help right away if you are not doing well or get worse. Document Released: 09/30/2010 Document Revised: 06/26/2011 Document Reviewed: 09/30/2010 [facility] Patient Information 2014 [place], [place].  Emergency Department Resource Guide 1) Find a Doctor and Pay Out of [place] Although you won't have to find out who is covered by your insurance plan, it is a good idea to ask around and get recommendations. You will then need to call the office and see if the doctor you have chosen will accept you as a new patient and what types of options they offer for patients who are self-pay. Some doctors offer discounts or will set up payment plans for their patients who do not have insurance, but you will need to ask so you aren't surprised when you get to your appointment.  2) Contact Your Local Health Department Not all health departments have doctors that can see patients for sick visits, but many do, so it is worth a call to see if yours does. If you don't know where your local health department is, you can check in your phone book. The CDC also has a tool to help you locate your state's health department, and many state websites also have listings of all of their local health departments.  3) Find a Walk-in Clinic If your illness is not likely to be very severe or complicated, you may want to try a walk in clinic. These are popping up all over the country in pharmacies, drugstores, and  shopping centers. They're usually staffed by nurse practitioners or physician assistants that have been trained to treat common illnesses  and complaints. They're usually fairly quick and inexpensive. However, if you have serious medical issues or chronic medical problems, these are probably not your best option.  No Primary Care Doctor: - Call Health Connect at  [phone] - they can help you locate a primary care doctor that  accepts your insurance, provides certain services, etc. - Physician Referral Service- [phone]  Chronic Pain Problems: Organization         Address  Phone   Notes  [facility]  [phone] Patients need to be referred by their primary care doctor.   Medication Assistance: Organization         Address  Phone   Notes  [facility] Medication [facility] [address] [place] [place], [place] [address] [phone] --Must be a resident of [facility] -- Must have NO insurance coverage whatsoever (no Medicaid/ Medicare, etc.) -- The pt. MUST have a primary care doctor that directs their care regularly and follows them in the community   [facility]ist  [phone]   [employer]  [phone]    Agencies that provide inexpensive medical care: Organization         Address  Phone   Notes  [facility]  [phone]   [facility]    [phone]   [facility] [address] [place], [place] [address] [phone]   [facility] [address] [place] [place] [phone]   Planned Parenthood    [phone]   [facility]    [phone]   Community Health and [facility]  [address], [place] Phone:  [phone], Fax:  [phone] Hours of Operation:  9 am - 6 pm, M-F.  Also accepts Medicaid/Medicare and self-pay.  [facility]  [address], [place] Phone: [phone],  Fax: [phone]. Hours of Operation:  8:30 am - 5:30 pm, M-F.  Also accepts Medicaid and self-pay.  [facility] [place] [address], [place] Phone: [phone]   [facility] [address] Natasha Bence [place], [place] [phone], Ext. [phone] Mondays & Thursdays: 7-9 AM.  First 15 patients are seen on a first come, first serve basis.    Medicaid-accepting [facility] Providers:  Organization         Address  Phone   Notes  [facility] [address], [place] [phone] Also accepts self-pay patients.  [facility] [address] Laurell Josephs [place], [place]  [phone]   [facility] [address], [place] [phone]   [facility] [address], [place] [phone]   Renaye Rakers [address], [place]   [phone] Only accepts [place] patients after they have their name applied to their card.   Self-Pay (no insurance) in [facility]:  Organization         Address  Phone   Notes  Sickle Cell Patients, [facility] [address] [place], [place] [phone]   [facility] [address] [place], [place] [phone]   [facility] [place]  [address], [place] [phone]   [facility]/Dr. Osei-Bonsu  [address], [place] or [address], [place] (  [phone]) [phone] Phone number for both [facility] and [place] locations is the same.  [facility] [address], [place] [phone](336) [phone]   [facility] [place] [address] [place] [address], [place] or [address] [phone](336) [phone]   [facility] [address], [place] [phone]) [phone], phone; [phone]) [phone], fax Sees patients 1st and 3rd Saturday of every month.  Must not qualify for public or private  insurance (i.e. Medicaid, Medicare, [place] Health Choice, Veterans' Benefits)  Household income should be no more than 200% of the poverty level The clinic cannot treat you if you are pregnant or think you are pregnant  Sexually transmitted diseases are not treated at the clinic.    Dental Care: Organization         Address  Phone  Notes  [facility] [address], [place] [phone] Accepts children up to age 34 who are enrolled in [place] or [place] Health Choice; pregnant women with a Medicaid card; and children who have applied for Medicaid or [place] Health Choice, but were declined, whose parents can pay a reduced fee at time of service.  [facility] [place]  [address], [place] [phone]) [phone] Accepts children up to age 34 who are enrolled in [place] or [place] Health Choice; pregnant women with a Medicaid card; and children who have applied for Medicaid or [place] Health Choice, but were declined, whose parents can pay a reduced fee at time of service.  [facility]  [address], [place] [phone]) [phone] Patients are seen by appointment only. Walk-ins are not accepted. [facility] will see patients 34 years of age and older. Monday - Tuesday (8am-5pm) Most Wednesdays (8:30-5pm) $30 per visit, cash only  [facility]  [address], [facility] [phone]) [phone] Patients are seen by appointment only. Walk-ins are not accepted. [facility] will see patients 34 years of age and older. One Wednesday Evening (Monthly: Volunteer Based).  $30 per visit, cash only  [employer] [facility]  [phone]) [phone] for adults; Children under age 684, call Graduate Pediatric Dentistry at [phone]) [phone]. Children aged 784-14, please call [phone]) [phone] to request a pediatric application.  Dental services are provided in all areas of dental care  including fillings, crowns and bridges, complete and partial dentures, implants, gum treatment, root canals, and extractions. Preventive care is also provided. Treatment is provided to both adults and children. Patients are selected via a lottery and there is often a waiting list.   [facility] [address], [place]  [phone]) [phone] [email or web address]   [facility] [address], [place], [place] [phone], Ext. [phone] Second and Fourth Thursday of each month, opens at 6:30 AM; Clinic ends at 9 AM.  Patients are seen on a first-come first-served basis, and a limited number are seen during each clinic.   [facility]  [address], [place], [place] [phone]) [phone]   Eligibility Requirements You must have lived in [place], [place], or [place] counties for at least the last three months.   You cannot be eligible for state or federal sponsored [place]healthcare insurance, including CIGNAVeterans Administration, [place]icaid, or [employer]icare.   You generally cannot be eligible for healthcare insurance through your employer.    How to apply: Eligibility screenings are held every Tuesday and Wednesday afternoon  from 1:00 pm until 4:00 pm. You do not need an appointment for the interview!  [facility] [address] [place], [place] [phone]   [facility] Health Department  [phone]   [facility] Health Department  [phone]   [facility] Health Department  [phone]    Behavioral Health Resources in the Community: Intensive Outpatient Programs Organization         Address  Phone  Notes  [facility] [address] [place], [place] [phone]   [facility] [address] [place], [place] [phone]   [facility] [address] [place], [place]  [phone]   [facility] [address]  [place], [place] [phone] or [phone]     Substance Abuse Resources Organization         Address  Phone  Notes  [facility]  [phone]   [facility]  [phone]   [facility] [place]  [phone]   Floydene Flock  [phone]   Residential & Outpatient Substance Abuse Program  [phone]   Psychological Services Organization         Address  Phone  Notes  [facility] Behavioral Health  [phone]   [facility] Services  [phone]   [facility] [address], [place] [phone] or [phone]    Mobile Crisis Teams Organization         Address  Phone  Notes  [facility], Mobile Crisis Care Unit  [phone]   [facility]  [address] [place], [place] [phone]   Doristine Locks [address] [place] [phone]    Self-Help/Support Groups Organization         Address  Phone             Notes  [facility] [place] - variety of support groups  336- I7437963 Call for more information  Narcotics Anonymous (NA), [facility] [address], [employer]   2 meetings at this location   Statistician         Address  Phone  Notes  [facility] [address],    [place]  [phone]   [facility]  [address] [place] [phone], [place], [place] [phone]   [facility] [address] [place], [place] [phone] Admissions: 8am-3pm M-F  [facility] [address],    [place], [place] [phone]   [facility] [address] [place], [place], [place] [phone]   [facility] [address],  [place], [place] [phone]   [facility] - Intensive Outpatient [address] Laurell Josephs [address], [place], [place] [phone]   [facility] ([facility]iction [facility] [address] [place]  [place], [place] [phone] or [phone]   [facility]idential [facility]  Services (RTS) [address] [place], [place] [phone] Accepts Medicaid  [facility] [place] [address],  [place] [phone] Substance Abuse/Addiction Treatment   [facility] Organization         Address  Phone  Notes  [facility]  [phone]   Angie Fava, PhD [address] Ervin Knack [place], [place]   [phone] or [phone]   [facility] Behavioral   [address] [place], [place] [phone]   [facility] [address] [place], [place] [phone] Insurance/Medicaid/sponsorship through [employer] and  Families [address], [address] [place], [place] [phone] [place], [place] [phone]    Dr. Adele Schilder  [phone]   [facility] Dept. 1) [address] [place] 2) [place] 3)  [place], [place] [phone]  [phone]   [place] [phone] or [phone] (After Hours)

## 2013-04-18 NOTE — ED Provider Notes (Signed)
CSN: [id number]     Arrival date & time 04/18/13  1629 History   First MD Initiated Contact with Patient 04/18/13 1732     Chief Complaint  Patient presents with  . Dizziness  . Sore Throat    HPI  Holly [name] is a [age] y.o. female with a PMH of seasonal allergies who presents to the ED for evaluation of multiple complaints.  History was provided by the patient.  Patient states that she has not been feeling well for the past week.  She had diarrhea and nausea for several days.  Her diarrhea has improved.  She had one episode of diarrhea today which was described as brown and watery.  No rectal pain or bleeding.  No hematochezia.  No recent travel or antibiotic use.  She has had consistent nausea and [facility] reglan which has not provided her relief.  She also has had decreased appetite and intake due to her nausea.  She denies any vomiting.  She has had intermittent RUQ abdominal pain but denies this currently.  She has a hx of cholecystectomy.  She also has had an intermittently productive cough with white sputum.  No SOB, chest pain, or wheezing.  She also complains of a sore throat like "swallowing glass" and anterior neck pain from her sore throat.  She also has lightheadedness which is worse with sitting up and ambulating.  She has lightheadedness at rest currently.  She also has a headache with photophobia.  No weakness, fever, neck stiffness, confusion, or loss of sensation.  Sick contacts include her son who had a sore throat, rhinorrhea, cough and diarrhea.    Past Medical History  Diagnosis Date  . Pregnancy   . Pregnancy induced hypertension   . Eczema     Hx: of  . Seasonal allergies     Hx: of   Past Surgical History  Procedure Laterality Date  . No past surgeries    . C-sectionx1    . Cesarean section    . Upper gi endoscopy      Hx: of  . Cholecystectomy N/A 08/30/2012    Procedure: LAPAROSCOPIC CHOLECYSTECTOMY WITH  INTRAOPERATIVE CHOLANGIOGRAM;  Surgeon: Shelly Rubensteinouglas A Blackman, MD;  Location: [facility] OR;  Service: General;  Laterality: N/A;   Family History  Problem Relation Age of Onset  . Colon cancer Neg Hx   . Esophageal cancer Neg Hx   . Stomach cancer Neg Hx   . Lung cancer Paternal Grandmother   . Diabetes Maternal Grandfather   . Kidney disease Paternal Uncle   . Hypertension Father   . Hyperlipidemia Father    History  Substance Use Topics  . Smoking status: Never Smoker   . Smokeless tobacco: Never Used  . Alcohol Use: No   OB History   Grav Para Term Preterm Abortions TAB SAB Ect Mult Living   3 2 2  1  1   2      Review of Systems  Constitutional: Positive for appetite change and fatigue. Negative for fever, chills, diaphoresis and activity change.  HENT: Positive for ear pain and sore throat. Negative for congestion, hearing loss, rhinorrhea, trouble swallowing and voice change.   Eyes: Positive for photophobia. Negative for visual disturbance.  Respiratory: Positive for cough. Negative for shortness of breath.   Cardiovascular: Negative for chest pain and leg swelling.  Gastrointestinal: Positive for nausea, abdominal pain and diarrhea. Negative for vomiting and constipation.  Genitourinary: Negative  for dysuria, decreased urine volume, vaginal bleeding, vaginal discharge and vaginal pain.  Musculoskeletal: Positive for neck pain. Negative for back pain and myalgias.  Skin: Negative for wound.  Neurological: Positive for light-headedness and headaches. Negative for dizziness, syncope, weakness and numbness.    Allergies  Naproxen sodium  Home Medications   Current Outpatient Rx  Name  Route  Sig  Dispense  Refill  . metoCLOPramide (REGLAN) 10 MG tablet   Oral   Take 1 tablet (10 mg total) by mouth every 6 (six) hours as needed for nausea.   90 tablet   1   . Prenatal Vit-Fe Fumarate-FA (PRENATAL MULTIVITAMIN) TABS   Oral   Take 1 tablet by mouth daily.          BP  155/97  Pulse 87  Temp(Src) 99.4 F (37.4 C) (Oral)  Resp 17  SpO2 99%  Filed Vitals:   04/18/13 1659 04/18/13 1930 04/18/13 2030 04/18/13 2236  BP: 155/97 139/76 131/85 115/75  Pulse: 87 82 81 80  Temp: 99.4 F (37.4 C)   98.7 F (37.1 C)  TempSrc: Oral   Oral  Resp: 17 13 16 18   SpO2: 99% 100% 100% 98%    Physical Exam  Nursing note and vitals reviewed. Constitutional: She is oriented to person, place, and time. She appears well-developed and well-nourished. No distress.  HENT:  Head: Normocephalic and atraumatic.  Right Ear: External ear normal.  Left Ear: External ear normal.  Nose: Nose normal.  Mouth/Throat: Oropharynx is clear and moist. No oropharyngeal exudate.  No erythema to the posterior pharynx.  No exudates.  Uvula midline.  No trismus.  Unable to visualize TM's due to cerumen.  No tenderness to palpation to the scalp throughout  Eyes: Conjunctivae and EOM are normal. Pupils are equal, round, and reactive to light. Right eye exhibits no discharge. Left eye exhibits no discharge.  Neck: Normal range of motion. Neck supple.  No cervical spinal or paraspinal tenderness to palpation throughout.  No limitations with neck ROM.  No LAD.    Cardiovascular: Normal rate, regular rhythm, normal heart sounds and intact distal pulses.  Exam reveals no gallop and no friction rub.   No murmur heard. Pulmonary/Chest: Effort normal and breath sounds normal. No respiratory distress. She has no wheezes. She has no rales. She exhibits no tenderness.  Abdominal: Soft. Bowel sounds are normal. She exhibits no distension and no mass. There is no tenderness. There is no rebound and no guarding.  No CVA or flank tenderness  Musculoskeletal: Normal range of motion. She exhibits no edema and no tenderness.  Strength 5/5 in the upper and lower extremities bilaterally.  Patient able to ambulate without difficulty or ataxia.    Neurological: She is alert and oriented to person, place, and  time.  GCS 15.  No focal neurological deficits.  CN 2-12 intact.   Skin: Skin is warm and dry. No rash noted. She is not diaphoretic. No erythema.    ED Course  EAR CERUMEN REMOVAL Date/Time: 04/18/2013 7:51 PM Performed by: Coral Ceo K Authorized by: Jillyn Ledger Consent: Verbal consent obtained. Consent given by: patient Local anesthetic: none Location details: left ear Procedure type: curette Patient sedated: no Patient tolerance: Patient tolerated the procedure well with no immediate complications.   (including critical care time) Labs Review Labs Reviewed  RAPID STREP SCREEN  CULTURE, GROUP A STREP   Imaging Review No results found.  EKG Interpretation    Date/Time:  Friday April 18 2013 19:16:46 EST Ventricular Rate:  72 PR Interval:  178 QRS Duration: 78 QT Interval:  392 QTC Calculation: 429 R Axis:   -22 Text Interpretation:  Sinus rhythm Borderline left axis deviation No significant change since last tracing Confirmed by KNAPP  MD-J, JON ([id number]) on 04/18/2013 7:29:14 PM           Results for orders placed during the hospital encounter of 04/18/13  RAPID STREP SCREEN      Result Value Range   Streptococcus, Group A Screen (Direct) NEGATIVE  NEGATIVE  URINALYSIS, ROUTINE W REFLEX MICROSCOPIC      Result Value Range   Color, Urine YELLOW  YELLOW   APPearance CLOUDY (*) CLEAR   Specific Gravity, Urine 1.022  1.005 - 1.030   pH 7.5  5.0 - 8.0   Glucose, UA NEGATIVE  NEGATIVE mg/dL   Hgb urine dipstick NEGATIVE  NEGATIVE   Bilirubin Urine NEGATIVE  NEGATIVE   Ketones, ur NEGATIVE  NEGATIVE mg/dL   Protein, ur NEGATIVE  NEGATIVE mg/dL   Urobilinogen, UA 1.0  0.0 - 1.0 mg/dL   Nitrite NEGATIVE  NEGATIVE   Leukocytes, UA MODERATE (*) NEGATIVE  PREGNANCY, URINE      Result Value Range   Preg Test, Ur NEGATIVE  NEGATIVE  CBC WITH DIFFERENTIAL      Result Value Range   WBC 11.2 (*) 4.0 - 10.5 K/uL   RBC 4.58  3.87 - 5.11 MIL/uL   Hemoglobin  12.8  12.0 - 15.0 g/dL   HCT 16.1  09.6 - 04.5 %   MCV 88.0  78.0 - 100.0 fL   MCH 27.9  26.0 - 34.0 pg   MCHC 31.8  30.0 - 36.0 g/dL   RDW 40.9  81.1 - 91.4 %   Platelets 352  150 - 400 K/uL   Neutrophils Relative % 64  43 - 77 %   Neutro Abs 7.2  1.7 - 7.7 K/uL   Lymphocytes Relative 29  12 - 46 %   Lymphs Abs 3.3  0.7 - 4.0 K/uL   Monocytes Relative 5  3 - 12 %   Monocytes Absolute 0.6  0.1 - 1.0 K/uL   Eosinophils Relative 2  0 - 5 %   Eosinophils Absolute 0.2  0.0 - 0.7 K/uL   Basophils Relative 0  0 - 1 %   Basophils Absolute 0.0  0.0 - 0.1 K/uL  COMPREHENSIVE METABOLIC PANEL      Result Value Range   Sodium 138  137 - 147 mEq/L   Potassium 4.3  3.7 - 5.3 mEq/L   Chloride 100  96 - 112 mEq/L   CO2 27  19 - 32 mEq/L   Glucose, Bld 111 (*) 70 - 99 mg/dL   BUN 8  6 - 23 mg/dL   Creatinine, Ser 7.82  0.50 - 1.10 mg/dL   Calcium 9.2  8.4 - 95.6 mg/dL   Total Protein 7.5  6.0 - 8.3 g/dL   Albumin 3.6  3.5 - 5.2 g/dL   AST 25  0 - 37 U/L   ALT 35  0 - 35 U/L   Alkaline Phosphatase 87  39 - 117 U/L   Total Bilirubin 0.5  0.3 - 1.2 mg/dL   GFR calc non Af Amer >90  >90 mL/min   GFR calc Af Amer >90  >90 mL/min  LIPASE, BLOOD      Result Value Range   Lipase 39  11 - 59 U/L  URINE MICROSCOPIC-ADD ON      Result Value Range   Squamous Epithelial / LPF RARE  RARE   WBC, UA 0-2  <3 WBC/hpf   Bacteria, UA FEW (*) RARE    DG Chest 2 View (Final result)  Result time: 04/18/13 20:00:39    Final result by Rad Results In Interface (04/18/13 20:00:39)    Narrative:   CLINICAL DATA: Dizzy, lightheaded, nausea  EXAM: CHEST 2 VIEW  COMPARISON: 08/24/2012  FINDINGS: The heart size and vascular pattern are normal. The lungs are clear. There are no pleural effusions.  IMPRESSION: No active cardiopulmonary disease.   Electronically Signed By: Esperanza Heir M.D. On: 04/18/2013 20:00         MDM   Holly [name] is a 33 y.o. female with a PMH of seasonal allergies  who presents to the ED for evaluation of multiple complaints.    Rechecks  8:00 PM = Cerumen removed from left ear.  Left tympanic membrane gray and translucent.  Lightheadedness improved.  Headache still present.   9:00 PM = Headache mild and much improved.  No lightheadedness.  Ate crackers and peanut butter and now has mild nausea.  Will order zofran and re-check 10:00 PM = Patient states her nausea has improved after zofran.  No other complaints.  Able to ambulate without difficulty, ataxia, or lightheadedness.  No other concerns.  Ready for discharge.      Patient evaluated in the emergency department for multiple complaints. She is afebrile and nontoxic in appearance. She complained of a sore throat and anterior neck pain. Her rapid strep was negative. She also complained of left ear pain. Cerumen was removed which revealed no signs of otitis media or other infectious process at this time. Patient also complained of a headache which improved throughout her ED visit. Patient also complained of lightheadedness which resolved with IV fluids. EKG negative for any acute ischemic changes. This is likely due to to decreased intake and appetite as well as diarrhea.  Patient complained of intermittent right upper quadrant abdominal pain intermittently however denies any abdominal pain throughout her ED visit. She is no tenderness to palpation on exam. Patient also complained of a cough. Chest x-ray was negative for an acute cardiopulmonary process. Patient found to have a urinary tract infection.  She'll be started on Keflex.  Patient seen earlier in the week for nausea and was prescribed Reglan. This did not work for her at home. Patient had improvement in her nausea with Zofran in emergency department.  Patient prescribed Zofran for outpatient management.  Patient instructed to followup with a primary care provider for further evaluation and management. Return precautions, discharge instructions, and  follow-up was discussed with the patient before discharge.     Final impressions: 1. UTI (urinary tract infection)   2. Sore throat       Greer Ee Lashika Erker PA-C         Jillyn Ledger, PA-C 04/20/13 [phone]

## 2013-04-20 LAB — CULTURE, GROUP A STREP

## 2013-04-20 NOTE — ED Provider Notes (Signed)
Medical screening examination/treatment/procedure(s) were performed by non-physician practitioner and as supervising physician I was immediately available for consultation/collaboration.  EKG Interpretation    Date/Time:  Friday April 18 2013 19:16:46 EST Ventricular Rate:  72 PR Interval:  178 QRS Duration: 78 QT Interval:  392 QTC Calculation: 429 R Axis:   -22 Text Interpretation:  Sinus rhythm Borderline left axis deviation No significant change since last tracing Confirmed by Maley Venezia  Holly [name]-J, Zeriyah Wain ([id number]) on 04/18/2013 7:29:14 PM             Holly KrasJon R Terrica Duecker, Holly [name] 04/20/13 1538

## 2013-08-08 ENCOUNTER — Emergency Department (HOSPITAL_COMMUNITY)
Admission: EM | Admit: 2013-08-08 | Discharge: 2013-08-08 | Disposition: A | Discharge status: Home / Self Care | Payer: Self-pay | Attending: Emergency Medicine | Admitting: Emergency Medicine

## 2013-08-08 ENCOUNTER — Emergency Department (HOSPITAL_COMMUNITY): Payer: Self-pay

## 2013-08-08 ENCOUNTER — Encounter (HOSPITAL_COMMUNITY): Payer: Self-pay | Admitting: Emergency Medicine

## 2013-08-08 DIAGNOSIS — R042 Hemoptysis: Secondary | ICD-10-CM | POA: Insufficient documentation

## 2013-08-08 DIAGNOSIS — R040 Epistaxis: Secondary | ICD-10-CM | POA: Insufficient documentation

## 2013-08-08 DIAGNOSIS — J302 Other seasonal allergic rhinitis: Secondary | ICD-10-CM

## 2013-08-08 DIAGNOSIS — Z792 Long term (current) use of antibiotics: Secondary | ICD-10-CM | POA: Insufficient documentation

## 2013-08-08 DIAGNOSIS — Z79899 Other long term (current) drug therapy: Secondary | ICD-10-CM | POA: Insufficient documentation

## 2013-08-08 DIAGNOSIS — Z3202 Encounter for pregnancy test, result negative: Secondary | ICD-10-CM | POA: Insufficient documentation

## 2013-08-08 DIAGNOSIS — Z872 Personal history of diseases of the skin and subcutaneous tissue: Secondary | ICD-10-CM | POA: Insufficient documentation

## 2013-08-08 DIAGNOSIS — J309 Allergic rhinitis, unspecified: Secondary | ICD-10-CM | POA: Insufficient documentation

## 2013-08-08 DIAGNOSIS — J3489 Other specified disorders of nose and nasal sinuses: Secondary | ICD-10-CM

## 2013-08-08 LAB — COMPREHENSIVE METABOLIC PANEL
ALK PHOS: 91 U/L (ref 39–117)
ALT: 33 U/L (ref 0–35)
AST: 24 U/L (ref 0–37)
Albumin: 3.8 g/dL (ref 3.5–5.2)
BILIRUBIN TOTAL: 0.6 mg/dL (ref 0.3–1.2)
BUN: 7 mg/dL (ref 6–23)
CO2: 26 meq/L (ref 19–32)
Calcium: 9.1 mg/dL (ref 8.4–10.5)
Chloride: 101 mEq/L (ref 96–112)
Creatinine, Ser: 0.67 mg/dL (ref 0.50–1.10)
GLUCOSE: 126 mg/dL — AB (ref 70–99)
POTASSIUM: 3.7 meq/L (ref 3.7–5.3)
Sodium: 140 mEq/L (ref 137–147)
Total Protein: 7.7 g/dL (ref 6.0–8.3)

## 2013-08-08 LAB — CBC WITH DIFFERENTIAL/PLATELET
Basophils Absolute: 0 10*3/uL (ref 0.0–0.1)
Basophils Relative: 0 % (ref 0–1)
Eosinophils Absolute: 0.1 10*3/uL (ref 0.0–0.7)
Eosinophils Relative: 1 % (ref 0–5)
HCT: 39.4 % (ref 36.0–46.0)
Hemoglobin: 12.8 g/dL (ref 12.0–15.0)
LYMPHS ABS: 2.2 10*3/uL (ref 0.7–4.0)
Lymphocytes Relative: 24 % (ref 12–46)
MCH: 27.5 pg (ref 26.0–34.0)
MCHC: 32.5 g/dL (ref 30.0–36.0)
MCV: 84.7 fL (ref 78.0–100.0)
Monocytes Absolute: 0.5 10*3/uL (ref 0.1–1.0)
Monocytes Relative: 5 % (ref 3–12)
NEUTROS PCT: 70 % (ref 43–77)
Neutro Abs: 6.4 10*3/uL (ref 1.7–7.7)
PLATELETS: 371 10*3/uL (ref 150–400)
RBC: 4.65 MIL/uL (ref 3.87–5.11)
RDW: 13.4 % (ref 11.5–15.5)
WBC: 9.2 10*3/uL (ref 4.0–10.5)

## 2013-08-08 LAB — URINALYSIS, ROUTINE W REFLEX MICROSCOPIC
Bilirubin Urine: NEGATIVE
GLUCOSE, UA: NEGATIVE mg/dL
KETONES UR: NEGATIVE mg/dL
Nitrite: NEGATIVE
Protein, ur: NEGATIVE mg/dL
Specific Gravity, Urine: 1.034 — ABNORMAL HIGH (ref 1.005–1.030)
Urobilinogen, UA: 1 mg/dL (ref 0.0–1.0)
pH: 6 (ref 5.0–8.0)

## 2013-08-08 LAB — URINE MICROSCOPIC-ADD ON

## 2013-08-08 LAB — PROTIME-INR
INR: 1.03 (ref 0.00–1.49)
Prothrombin Time: 13.3 seconds (ref 11.6–15.2)

## 2013-08-08 LAB — PREGNANCY, URINE: PREG TEST UR: NEGATIVE

## 2013-08-08 MED ORDER — KETOROLAC TROMETHAMINE 60 MG/2ML IM SOLN
60.0000 mg | Freq: Once | INTRAMUSCULAR | Status: AC
  Administered 2013-08-08: 60 mg via INTRAMUSCULAR
  Filled 2013-08-08: qty 2

## 2013-08-08 MED ORDER — LORATADINE 10 MG PO TABS: 10.0000 mg | ORAL_TABLET | Freq: Every day | ORAL | Status: DC

## 2013-08-08 MED ORDER — FLUTICASONE PROPIONATE 50 MCG/ACT NA SUSP: 1.0000 | Freq: Every day | NASAL | Status: DC

## 2013-08-08 MED ORDER — HYDROCODONE-ACETAMINOPHEN 5-325 MG PO TABS: 1.0000 | ORAL_TABLET | ORAL | Status: DC | PRN

## 2013-08-08 NOTE — ED Notes (Signed)
Pt states since yesterday, pt has had cough congestion and headache.  Unknown for fever.  Did not check at home. Pt has been taking sinus/cold meds at home with no relief.  Pt also states she has had nose bleed with "coughing up blood tinged sputum".

## 2013-08-08 NOTE — Discharge Instructions (Signed)
Allergic Rhinitis Allergic rhinitis is when the mucous membranes in the nose respond to allergens. Allergens are particles in the air that cause your body to have an allergic reaction. This causes you to release allergic antibodies. Through a chain of events, these eventually cause you to release histamine into the blood stream. Although meant to protect the body, it is this release of histamine that causes your discomfort, such as frequent sneezing, congestion, and an itchy, runny nose.  CAUSES  Seasonal allergic rhinitis (hay fever) is caused by pollen allergens that may come from grasses, trees, and weeds. Year-round allergic rhinitis (perennial allergic rhinitis) is caused by allergens such as house dust mites, pet dander, and mold spores.  SYMPTOMS   Nasal stuffiness (congestion).  Itchy, runny nose with sneezing and tearing of the eyes. DIAGNOSIS  Your health care provider can help you determine the allergen or allergens that trigger your symptoms. If you and your health care provider are unable to determine the allergen, skin or blood testing may be used. TREATMENT  Allergic Rhinitis does not have a cure, but it can be controlled by:  Medicines and allergy shots (immunotherapy).  Avoiding the allergen. Hay fever may often be treated with antihistamines in pill or nasal spray forms. Antihistamines block the effects of histamine. There are over-the-counter medicines that may help with nasal congestion and swelling around the eyes. Check with your health care provider before taking or giving this medicine.  If avoiding the allergen or the medicine prescribed do not work, there are many new medicines your health care provider can prescribe. Stronger medicine may be used if initial measures are ineffective. Desensitizing injections can be used if medicine and avoidance does not work. Desensitization is when a patient is given ongoing shots until the body becomes less sensitive to the allergen.  Make sure you follow up with your health care provider if problems continue. HOME CARE INSTRUCTIONS It is not possible to completely avoid allergens, but you can reduce your symptoms by taking steps to limit your exposure to them. It helps to know exactly what you are allergic to so that you can avoid your specific triggers. SEEK MEDICAL CARE IF:   You have a fever.  You develop a cough that does not stop easily (persistent).  You have shortness of breath.  You start wheezing.  Symptoms interfere with normal daily activities. Document Released: 12/27/2000 Document Revised: 01/22/2013 Document Reviewed: 12/09/2012 [facility] Patient Information 2014 [place], [place].   Headaches, Frequently Asked Questions MIGRAINE HEADACHES Q: What is migraine? What causes it? How can I treat it? A: Generally, migraine headaches begin as a dull ache. Then they develop into a constant, throbbing, and pulsating pain. You may experience pain at the temples. You may experience pain at the front or back of one or both sides of the head. The pain is usually accompanied by a combination of:  Nausea.  Vomiting.  Sensitivity to light and noise. Some people (about 15%) experience an aura (see below) before an attack. The cause of migraine is believed to be chemical reactions in the brain. Treatment for migraine may include over-the-counter or prescription medications. It may also include self-help techniques. These include relaxation training and biofeedback.  Q: What is an aura? A: About 15% of people with migraine get an "aura". This is a sign of neurological symptoms that occur before a migraine headache. You may see wavy or jagged lines, dots, or flashing lights. You might experience tunnel vision or blind spots in one  or both eyes. The aura can include visual or auditory hallucinations (something imagined). It may include disruptions in smell (such as strange odors), taste or touch. Other symptoms  include:  Numbness.  A "pins and needles" sensation.  Difficulty in recalling or speaking the correct word. These neurological events may last as long as 60 minutes. These symptoms will fade as the headache begins. Q: What is a trigger? A: Certain physical or environmental factors can lead to or "trigger" a migraine. These include:  Foods.  Hormonal changes.  Weather.  Stress. It is important to remember that triggers are different for everyone. To help prevent migraine attacks, you need to figure out which triggers affect you. Keep a headache diary. This is a good way to track triggers. The diary will help you talk to your healthcare professional about your condition. Q: Does weather affect migraines? A: Bright sunshine, hot, humid conditions, and drastic changes in barometric pressure may lead to, or "trigger," a migraine attack in some people. But studies have shown that weather does not act as a trigger for everyone with migraines. Q: What is the link between migraine and hormones? A: Hormones start and regulate many of your body's functions. Hormones keep your body in balance within a constantly changing environment. The levels of hormones in your body are unbalanced at times. Examples are during menstruation, pregnancy, or menopause. That can lead to a migraine attack. In fact, about three quarters of all women with migraine report that their attacks are related to the menstrual cycle.  Q: Is there an increased risk of stroke for migraine sufferers? A: The likelihood of a migraine attack causing a stroke is very remote. That is not to say that migraine sufferers cannot have a stroke associated with their migraines. In persons under age 34, the most common associated factor for stroke is migraine headache. But over the course of a person's normal life span, the occurrence of migraine headache may actually be associated with a reduced risk of dying from cerebrovascular disease due to  stroke.  Q: What are acute medications for migraine? A: Acute medications are used to treat the pain of the headache after it has started. Examples over-the-counter medications, NSAIDs, ergots, and triptans.  Q: What are the triptans? A: Triptans are the newest class of abortive medications. They are specifically targeted to treat migraine. Triptans are vasoconstrictors. They moderate some chemical reactions in the brain. The triptans work on receptors in your brain. Triptans help to restore the balance of a neurotransmitter called serotonin. Fluctuations in levels of serotonin are thought to be a main cause of migraine.  Q: Are over-the-counter medications for migraine effective? A: Over-the-counter, or "OTC," medications may be effective in relieving mild to moderate pain and associated symptoms of migraine. But you should see your caregiver before beginning any treatment regimen for migraine.  Q: What are preventive medications for migraine? A: Preventive medications for migraine are sometimes referred to as "prophylactic" treatments. They are used to reduce the frequency, severity, and length of migraine attacks. Examples of preventive medications include antiepileptic medications, antidepressants, beta-blockers, calcium channel blockers, and NSAIDs (nonsteroidal anti-inflammatory drugs). Q: Why are anticonvulsants used to treat migraine? A: During the past few years, there has been an increased interest in antiepileptic drugs for the prevention of migraine. They are sometimes referred to as "anticonvulsants". Both epilepsy and migraine may be caused by similar reactions in the brain.  Q: Why are antidepressants used to treat migraine? A: Antidepressants are typically used  to treat people with depression. They may reduce migraine frequency by regulating chemical levels, such as serotonin, in the brain.  Q: What alternative therapies are used to treat migraine? A: The term "alternative therapies" is  often used to describe treatments considered outside the scope of conventional Western medicine. Examples of alternative therapy include acupuncture, acupressure, and yoga. Another common alternative treatment is herbal therapy. Some herbs are believed to relieve headache pain. Always discuss alternative therapies with your caregiver before proceeding. Some herbal products contain arsenic and other toxins. TENSION HEADACHES Q: What is a tension-type headache? What causes it? How can I treat it? A: Tension-type headaches occur randomly. They are often the result of temporary stress, anxiety, fatigue, or anger. Symptoms include soreness in your temples, a tightening band-like sensation around your head (a "vice-like" ache). Symptoms can also include a pulling feeling, pressure sensations, and contracting head and neck muscles. The headache begins in your forehead, temples, or the back of your head and neck. Treatment for tension-type headache may include over-the-counter or prescription medications. Treatment may also include self-help techniques such as relaxation training and biofeedback. CLUSTER HEADACHES Q: What is a cluster headache? What causes it? How can I treat it? A: Cluster headache gets its name because the attacks come in groups. The pain arrives with little, if any, warning. It is usually on one side of the head. A tearing or bloodshot eye and a runny nose on the same side of the headache may also accompany the pain. Cluster headaches are believed to be caused by chemical reactions in the brain. They have been described as the most severe and intense of any headache type. Treatment for cluster headache includes prescription medication and oxygen. SINUS HEADACHES Q: What is a sinus headache? What causes it? How can I treat it? A: When a cavity in the bones of the face and skull (a sinus) becomes inflamed, the inflammation will cause localized pain. This condition is usually the result of an  allergic reaction, a tumor, or an infection. If your headache is caused by a sinus blockage, such as an infection, you will probably have a fever. An x-ray will confirm a sinus blockage. Your caregiver's treatment might include antibiotics for the infection, as well as antihistamines or decongestants.  REBOUND HEADACHES Q: What is a rebound headache? What causes it? How can I treat it? A: A pattern of taking acute headache medications too often can lead to a condition known as "rebound headache." A pattern of taking too much headache medication includes taking it more than 2 days per week or in excessive amounts. That means more than the label or a caregiver advises. With rebound headaches, your medications not only stop relieving pain, they actually begin to cause headaches. Doctors treat rebound headache by tapering the medication that is being overused. Sometimes your caregiver will gradually substitute a different type of treatment or medication. Stopping may be a challenge. Regularly overusing a medication increases the potential for serious side effects. Consult a caregiver if you regularly use headache medications more than 2 days per week or more than the label advises. ADDITIONAL QUESTIONS AND ANSWERS Q: What is biofeedback? A: Biofeedback is a self-help treatment. Biofeedback uses special equipment to monitor your body's involuntary physical responses. Biofeedback monitors:  Breathing.  Pulse.  Heart rate.  Temperature.  Muscle tension.  Brain activity. Biofeedback helps you refine and perfect your relaxation exercises. You learn to control the physical responses that are related to stress. Once the technique  has been mastered, you do not need the equipment any more. Q: Are headaches hereditary? A: Four out of five (80%) of people that suffer report a family history of migraine. Scientists are not sure if this is genetic or a family predisposition. Despite the uncertainty, a child has  a 50% chance of having migraine if one parent suffers. The child has a 75% chance if both parents suffer.  Q: Can children get headaches? A: By the time they reach high school, most young people have experienced some type of headache. Many safe and effective approaches or medications can prevent a headache from occurring or stop it after it has begun.  Q: What type of doctor should I see to diagnose and treat my headache? A: Start with your primary caregiver. Discuss his or her experience and approach to headaches. Discuss methods of classification, diagnosis, and treatment. Your caregiver may decide to recommend you to a headache specialist, depending upon your symptoms or other physical conditions. Having diabetes, allergies, etc., may require a more comprehensive and inclusive approach to your headache. The National Headache Foundation will provide, upon request, a list of [facility] physician members in your state. Document Released: 06/24/2003 Document Revised: 06/26/2011 Document Reviewed: 12/02/2007 [facility] Patient Information 2014 [place], [place].

## 2013-08-08 NOTE — ED Provider Notes (Signed)
Medical screening examination/treatment/procedure(s) were performed by non-physician practitioner and as supervising physician I was immediately available for consultation/collaboration.   EKG Interpretation None        Dagmar HaitWilliam Brendy Ficek, MD 08/08/13 [phone]

## 2013-08-08 NOTE — ED Provider Notes (Signed)
CSN: [id number]     Arrival date & time 08/08/13  1720 History   First MD Initiated Contact with Patient 08/08/13 1730     Chief Complaint  Patient presents with  . Nasal Congestion  . Headache     (Consider location/radiation/quality/duration/timing/severity/associated sxs/prior Treatment) HPI  Holly [name] is a 33 y.o.female with a significant PMH of eczema, seasonal allergies, no past surgeries, cholecystectomy  presents to the ER with complaints of sinus pressure, nasal congestion, headache, and cough for the past 3 days. She also had a bloody nose early this morning and coughed up specks of blood. She has tried Catering managerAlka Seltzer Sinus at home without relief. Ibuprofen and Tylenol has not helped her headache.  She denies having fever, weakness, nausea, vomiting or diarrhea. Her initial blood pressure in triage was elevated but on recheck it is 151/77 without any intervention.   Past Medical History  Diagnosis Date  . Pregnancy   . Pregnancy induced hypertension   . Eczema     Hx: of  . Seasonal allergies     Hx: of   Past Surgical History  Procedure Laterality Date  . No past surgeries    . C-sectionx1    . Cesarean section    . Upper gi endoscopy      Hx: of  . Cholecystectomy N/A 08/30/2012    Procedure: LAPAROSCOPIC CHOLECYSTECTOMY WITH INTRAOPERATIVE CHOLANGIOGRAM;  Surgeon: Shelly Rubensteinouglas A Blackman, MD;  Location: [facility] OR;  Service: General;  Laterality: N/A;   Family History  Problem Relation Age of Onset  . Colon cancer Neg Hx   . Esophageal cancer Neg Hx   . Stomach cancer Neg Hx   . Lung cancer Paternal Grandmother   . Diabetes Maternal Grandfather   . Kidney disease Paternal Uncle   . Hypertension Father   . Hyperlipidemia Father    History  Substance Use Topics  . Smoking status: Never Smoker   . Smokeless tobacco: Never Used  . Alcohol Use: No   OB History   Grav Para Term Preterm Abortions TAB SAB Ect Mult Living   3 2 2  1  1   2      Review of  Systems   Review of Systems  Gen: no weight loss,  chills, night sweats + fevers Eyes: no discharge or drainage, no occular pain or visual changes  Nose: no epistaxis + nasal congestion and epistaxis Mouth: no dental pain, no sore throat  Neck: no neck pain  Lungs:No wheezing, coughing or hemoptysis CV: no chest pain, palpitations, dependent edema or orthopnea, + coughing up blood. Abd: no abdominal pain, nausea, vomiting, diarrhea GU: no dysuria or gross hematuria  MSK:  No muscle weakness or pain Neuro: + headache, no focal neurologic deficits  Skin: no rash or wounds Psyche: no complaints    Allergies  Naproxen sodium  Home Medications   Prior to Admission medications   Medication Sig Start Date End Date Taking? Authorizing Provider  cephALEXin (KEFLEX) 500 MG capsule Take 1 capsule (500 mg total) by mouth 2 (two) times daily. 04/18/13   Jillyn LedgerJessica K Palmer, PA-C  metoCLOPramide (REGLAN) 10 MG tablet Take 1 tablet (10 mg total) by mouth every 6 (six) hours as needed for nausea. 04/12/13 04/12/14  Dorathy KinsmanVirginia Smith, CNM  ondansetron (ZOFRAN ODT) 4 MG disintegrating tablet Take 1 tablet (4 mg total) by mouth every 8 (eight) hours as needed for nausea. 04/18/13   Jillyn LedgerJessica K Palmer, PA-C  Prenatal Vit-Fe Fumarate-FA (PRENATAL MULTIVITAMIN) TABS  Take 1 tablet by mouth daily.    Historical Provider, MD   BP 151/77  Pulse 99  Temp(Src) 97.8 F (36.6 C) (Oral)  Resp 16  SpO2 98% Physical Exam  Nursing note and vitals reviewed. Constitutional: She appears well-developed and well-nourished. No distress.  HENT:  Head: Normocephalic and atraumatic.  Right Ear: Tympanic membrane and ear canal normal.  Left Ear: Tympanic membrane and ear canal normal.  Nose: Mucosal edema and rhinorrhea present. Right sinus exhibits maxillary sinus tenderness and frontal sinus tenderness. Left sinus exhibits maxillary sinus tenderness and frontal sinus tenderness.  Mouth/Throat: Uvula is midline, oropharynx  is clear and moist and mucous membranes are normal.  Eyes: Pupils are equal, round, and reactive to light.  Neck: Normal range of motion. Neck supple.  Cardiovascular: Normal rate and regular rhythm.   Pulmonary/Chest: Effort normal and breath sounds normal.  Abdominal: Soft.  Neurological: She is alert.  Skin: Skin is warm and dry.    ED Course  Procedures (including critical care time) Labs Review Labs Reviewed  COMPREHENSIVE METABOLIC PANEL - Abnormal; Notable for the following:    Glucose, Bld 126 (*)    All other components within normal limits  URINALYSIS, ROUTINE W REFLEX MICROSCOPIC - Abnormal; Notable for the following:    APPearance CLOUDY (*)    Specific Gravity, Urine 1.034 (*)    Hgb urine dipstick TRACE (*)    Leukocytes, UA MODERATE (*)    All other components within normal limits  URINE MICROSCOPIC-ADD ON - Abnormal; Notable for the following:    Squamous Epithelial / LPF MANY (*)    All other components within normal limits  CBC WITH DIFFERENTIAL  PROTIME-INR  PREGNANCY, URINE    Imaging Review Dg Chest 2 View  08/08/2013   CLINICAL DATA:  Left face and neck pain, dizziness  EXAM: CHEST  2 VIEW  COMPARISON:  Prior chest x-ray 04/18/2013  FINDINGS: The lungs are clear and negative for focal airspace consolidation, pulmonary edema or suspicious pulmonary nodule. No pleural effusion or pneumothorax. Cardiac and mediastinal contours are within normal limits. No acute fracture or lytic or blastic osseous lesions. The visualized upper abdominal bowel gas pattern is unremarkable.  IMPRESSION: No active cardiopulmonary disease.   Electronically Signed   By: Malachy MoanHeath  McCullough M.D.   On: 08/08/2013 18:35     EKG Interpretation None      MDM   Final diagnoses:  Seasonal allergies  Rhinorrhea    Lab work and xray is reassuring. She is driving and has agreed to Toradol IM for pain control.  Will rx Flonase, Claritin and Vicodin #8 tabs for home.  She can  follow-up with PCP.  33 y.o.Holly [name]'s evaluation in the Emergency Department is complete. It has been determined that no acute conditions requiring further emergency intervention are present at this time. The patient/guardian have been advised of the diagnosis and plan. We have discussed signs and symptoms that warrant return to the ED, such as changes or worsening in symptoms.  Vital signs are stable at discharge. Filed Vitals:   08/08/13 1737  BP: 151/77  Pulse:   Temp:   Resp:     Patient/guardian has voiced understanding and agreed to follow-up with the PCP or specialist.     Dorthula Matasiffany G Shagun Wordell, PA-C 08/08/13 1850

## 2013-10-27 ENCOUNTER — Inpatient Hospital Stay (HOSPITAL_COMMUNITY)
Admission: AD | Admit: 2013-10-27 | Discharge: 2013-10-27 | Disposition: A | Discharge status: Home / Self Care | Payer: Self-pay | Source: Ambulatory Visit | Attending: Family Medicine | Admitting: Family Medicine

## 2013-10-27 ENCOUNTER — Encounter (HOSPITAL_COMMUNITY): Payer: Self-pay | Admitting: *Deleted

## 2013-10-27 DIAGNOSIS — R030 Elevated blood-pressure reading, without diagnosis of hypertension: Secondary | ICD-10-CM | POA: Insufficient documentation

## 2013-10-27 DIAGNOSIS — K529 Noninfective gastroenteritis and colitis, unspecified: Secondary | ICD-10-CM

## 2013-10-27 DIAGNOSIS — K5289 Other specified noninfective gastroenteritis and colitis: Secondary | ICD-10-CM

## 2013-10-27 DIAGNOSIS — M549 Dorsalgia, unspecified: Secondary | ICD-10-CM | POA: Insufficient documentation

## 2013-10-27 DIAGNOSIS — R197 Diarrhea, unspecified: Secondary | ICD-10-CM | POA: Insufficient documentation

## 2013-10-27 DIAGNOSIS — R630 Anorexia: Secondary | ICD-10-CM | POA: Insufficient documentation

## 2013-10-27 LAB — URINE MICROSCOPIC-ADD ON

## 2013-10-27 LAB — URINALYSIS, ROUTINE W REFLEX MICROSCOPIC
Glucose, UA: NEGATIVE mg/dL
Ketones, ur: 15 mg/dL — AB
Nitrite: NEGATIVE
Protein, ur: 30 mg/dL — AB
Specific Gravity, Urine: 1.025 (ref 1.005–1.030)
Urobilinogen, UA: 2 mg/dL — ABNORMAL HIGH (ref 0.0–1.0)
pH: 6 (ref 5.0–8.0)

## 2013-10-27 LAB — POCT PREGNANCY, URINE: PREG TEST UR: NEGATIVE

## 2013-10-27 MED ORDER — ONDANSETRON 4 MG PO TBDP: 4.0000 mg | ORAL_TABLET | Freq: Three times a day (TID) | ORAL | Status: DC | PRN

## 2013-10-27 MED ORDER — ONDANSETRON HCL 4 MG PO TABS
4.0000 mg | ORAL_TABLET | Freq: Once | ORAL | Status: AC
  Administered 2013-10-27: 4 mg via ORAL

## 2013-10-27 NOTE — MAU Provider Note (Signed)
Attestation of Attending Supervision of Advanced Practitioner (PA/CNM/NP): Evaluation and management procedures were performed by the Advanced Practitioner under my supervision and collaboration.  I have reviewed the Advanced Practitioner's note and chart, and I agree with the management and plan.  Reva BoresPRATT,TANYA S, MD [facility] Attending 10/27/2013 7:35 PM

## 2013-10-27 NOTE — MAU Provider Note (Signed)
None     Chief Complaint:  Emesis   Holly [name] is  33 y.o. [id number].  Patient's last menstrual period was 10/23/2013.[name]  Her pregnancy status is negative.  She presents complaining of diarrhea and nausea with dry heaves over the last five days. Pt has has decreased appetite for the last 5 days but has been tolerating yogurt and crackers and liquids. More complex diet causes diarrhea. Pt is tolerating food today and has only had 1 episode of diarrhea. Sick contact from son.  Pt also has had a period with her IUD. Usually she only spots and this is more than usual. Pt asks to check her string because the internet said it may have fallen out.  OB History   Grav Para Term Preterm Abortions TAB SAB Ect Mult Living   3 2 2  1  1   2        Past Medical History  Diagnosis Date  . Pregnancy   . Pregnancy induced hypertension   . Eczema     Hx: of  . Seasonal allergies     Hx: of    Past Surgical History  Procedure Laterality Date  . No past surgeries    . C-sectionx1    . Cesarean section    . Upper gi endoscopy      Hx: of  . Cholecystectomy N/A 08/30/2012    Procedure: LAPAROSCOPIC CHOLECYSTECTOMY WITH INTRAOPERATIVE CHOLANGIOGRAM;  Surgeon: Shelly Rubenstein, MD;  Location: [facility] OR;  Service: General;  Laterality: N/A;    Family History  Problem Relation Age of Onset  . Colon cancer Neg Hx   . Esophageal cancer Neg Hx   . Stomach cancer Neg Hx   . Lung cancer Paternal Grandmother   . Diabetes Maternal Grandfather   . Kidney disease Paternal Uncle   . Hypertension Father   . Hyperlipidemia Father     History  Substance Use Topics  . Smoking status: Never Smoker   . Smokeless tobacco: Never Used  . Alcohol Use: No    Allergies:  Allergies  Allergen Reactions  . Naproxen Sodium     Chest burns     Prescriptions prior to admission  Medication Sig Dispense Refill  . fluticasone (FLONASE) 50 MCG/ACT nasal spray Place 1-2 sprays into both nostrils daily.  10 g   0  . HYDROcodone-acetaminophen (NORCO/VICODIN) 5-325 MG per tablet Take 1-2 tablets by mouth every 4 (four) hours as needed.  8 tablet  0  . loratadine (CLARITIN) 10 MG tablet Take 1 tablet (10 mg total) by mouth daily.  14 tablet  0     Review of Systems  Denies f/c, sob, +Nausea, no vomiting, +GERD, no HA, no blood in diarrhea, no recent travel, +Sick contact - son similar symptoms.   No other complaints  Physical Exam   Blood pressure 123/88, pulse 89, temperature 99.6 F (37.6 C), temperature source Oral, resp. rate 18, height 5' 2.5" (1.588 m), weight 137.893 kg (304 lb), last menstrual period 10/23/2013.  General: General appearance - alert, well appearing, and in no distress Chest - clear to auscultation, no wheezes, rales or rhonchi, symmetric air entry Heart - normal rate, regular rhythm, normal S1, S2, no murmurs, rubs, clicks or gallops Abdomen - soft, nontender, nondistended, no masses or organomegaly tenderness noted at epigastric, mild Pelvic - minimal blood, strings visualized, no discharge Extremities - peripheral pulses normal, no pedal edema, no clubbing or cyanosis   Labs: No results found for this  or any previous visit (from the past 24 hour(s)). Imaging Studies:  No results found.   Assessment: Patient Active Problem List   Diagnosis Date Noted  . Viral syndrome 01/13/2013  . Vomiting 09/30/2012  . Back pain 09/30/2012  . Annual physical exam 09/30/2012  . Elevated blood pressure reading without diagnosis of hypertension 09/30/2012  . S/P cholecystectomy 08/29/2012  . Obesity 04/15/2012  . Abnormal finding on GI tract imaging 05/11/2011    Plan: 33 y.o. F w/ nausea and diarrehea that is improving, tolerating food today. Likely gastroenterities from son (known sick contact). Zofran for nausea and diet provided for diarrhea  String check confirmed. Reassured pt she is protected.  Return for fever or blood in stool or inability to tolerate  PO  Kaya Pottenger RYAN

## 2013-10-27 NOTE — Discharge Instructions (Signed)

## 2013-10-27 NOTE — MAU Note (Signed)
Been unable to keep anything down for about 5 days.  Diarrhea past 5 nights, once today. (son had diarrhea - lasted 3 days).  When cycle came on was heavier than usual. Has mirena and usually just spots, so thinks it may have come out, can't feel strings- but had never checked before.

## 2014-02-16 ENCOUNTER — Encounter (HOSPITAL_COMMUNITY): Payer: Self-pay | Admitting: *Deleted

## 2014-02-23 ENCOUNTER — Ambulatory Visit (INDEPENDENT_AMBULATORY_CARE_PROVIDER_SITE_OTHER): Payer: Medicaid Other | Admitting: Family Medicine

## 2014-02-23 ENCOUNTER — Encounter: Payer: Self-pay | Admitting: Family Medicine

## 2014-02-23 VITALS — BP 156/100 | HR 96 | Temp 99.3°F | Ht 65.0 in | Wt 307.6 lb

## 2014-02-23 DIAGNOSIS — R059 Cough, unspecified: Secondary | ICD-10-CM

## 2014-02-23 DIAGNOSIS — R05 Cough: Secondary | ICD-10-CM

## 2014-02-23 MED ORDER — BENZONATATE 200 MG PO CAPS: 200.0000 mg | ORAL_CAPSULE | Freq: Three times a day (TID) | ORAL | Status: DC | PRN

## 2014-02-23 NOTE — Assessment & Plan Note (Addendum)
Pt C/O 4 weeks of cough/congestion/nausea/diarrhea.   - Most likely related to viral URI vs allergic rhinitis - Recommend nasal saline, ibuprofen regularly, Mucinex, and will Rx Tessalon for her cough, however, believe Sx tx will get rid of her cough if her allergies/virus abates - Nausea is a little perplexing, however, could be related to mucous ingestion vs reflux vs viral Gastro - If no improvement with conservative management, would consider further imaging including CT abdomen w/ contrast.  She has had previously had cholecystectomy so doubt from biliary tree, but Sx described as classic from biliary colic/GB origin.  Could consider surgery/GI referral if continues as well - Despite being only 33, small chance could be from diabetic gastroparesis?[name].  May recommend A1C  - Reassuring in all aspects that she has not had fever.  No weight loss (unfortunately for her, would help multiple issues).   - Can try imdoium for diarrhea, as no melena/hematochezia she has noticed.  No fevers, chills, sweats, as well.

## 2014-02-23 NOTE — Progress Notes (Signed)
Holly [name] is a 33 y.o. female who presents today for multiple complaints, most notably cough and nausea x 4 weeks.  Cough/Nausea - Pt states this has been ongoing now for about 4 weeks and she has not tried much other than occasional tylenol for this.  No changes in diet, travel, medications, or sick contacts at that time.  She has coughed more after being outside or at night and the nausea seems to be worse at night.  Denies fever, chills, sweats, diffuse abdominal pain, headache, blurred vision, dysuria, hematuria, melena, hematochezia.  Does have remote hx of allergic rhinitis but has not been treated for this recently.  Nausea mostly related to cough, has not actually vomited.  Nausea is also worse after meals.  Describing occasional having RUQ pain that is more like sidestich, but she has had previously cholecystectomy about one year ago.    Diarrhea - Ongoing now for one week, worse after meals, no change in diet or medication, no travel, and no sick contacts.  No blood, fever, chills, weight loss, or melena/hematochezia.  Has not tried anything for this as well.   Past Medical History  Diagnosis Date  . Pregnancy   . Pregnancy induced hypertension   . Eczema     Hx: of  . Seasonal allergies     Hx: of    History  Smoking status  . Never Smoker   Smokeless tobacco  . Never Used    Family History  Problem Relation Age of Onset  . Colon cancer Neg Hx   . Esophageal cancer Neg Hx   . Stomach cancer Neg Hx   . Lung cancer Paternal Grandmother   . Diabetes Maternal Grandfather   . Kidney disease Paternal Uncle   . Hypertension Father   . Hyperlipidemia Father     Current Outpatient Prescriptions on File Prior to Visit  Medication Sig Dispense Refill  . fluticasone (FLONASE) 50 MCG/ACT nasal spray Place 1-2 sprays into both nostrils daily. 10 g 0  . HYDROcodone-acetaminophen (NORCO/VICODIN) 5-325 MG per tablet Take 1-2 tablets by mouth every 4 (four) hours as needed. 8  tablet 0  . loratadine (CLARITIN) 10 MG tablet Take 1 tablet (10 mg total) by mouth daily. 14 tablet 0  . ondansetron (ZOFRAN ODT) 4 MG disintegrating tablet Take 1 tablet (4 mg total) by mouth every 8 (eight) hours as needed for nausea or vomiting. 20 tablet 0   No current facility-administered medications on file prior to visit.    ROS: Per HPI.  All other systems reviewed and are negative.   Physical Exam Filed Vitals:   02/23/14 1355  BP: 156/100  Pulse: 96  Temp: 99.3 F (37.4 C)    Physical Examination: General appearance - alert, well appearing, and in no distress Mental status - alert, oriented to person, place, and time Eyes - sclera anicteric, left eye normal, right eye normal Ears - bilateral TM's and external ear canals normal Mouth - O/P clear, MMM  Neck - supple, no significant adenopathy Heart - normal rate and regular rhythm Abdomen - soft, nontender, nondistended, no masses or organomegaly

## 2014-02-23 NOTE — Patient Instructions (Signed)
Please use nasal saline, Mucinex, tessalon pearls, and ibuprofen.  You may also use Imodium for your diarrhea.  If you do not have an improvement in about 1 week, please see your primary doctor for further evaluation.   Thanks, Dr. Paulina FusiHess

## 2015-02-15 ENCOUNTER — Encounter: Payer: Self-pay | Admitting: Family Medicine

## 2015-02-15 ENCOUNTER — Ambulatory Visit (INDEPENDENT_AMBULATORY_CARE_PROVIDER_SITE_OTHER): Payer: Medicaid Other | Admitting: Family Medicine

## 2015-02-15 VITALS — BP 140/90 | HR 78 | Temp 98.8°F | Ht 65.0 in | Wt 300.0 lb

## 2015-02-15 DIAGNOSIS — R059 Cough, unspecified: Secondary | ICD-10-CM

## 2015-02-15 DIAGNOSIS — B349 Viral infection, unspecified: Secondary | ICD-10-CM

## 2015-02-15 DIAGNOSIS — R05 Cough: Secondary | ICD-10-CM | POA: Diagnosis present

## 2015-02-15 MED ORDER — BENZONATATE 200 MG PO CAPS: 200.0000 mg | ORAL_CAPSULE | Freq: Three times a day (TID) | ORAL | Status: DC | PRN

## 2015-02-15 MED ORDER — LOPERAMIDE HCL 2 MG PO CHEW: 2.0000 mg | CHEWABLE_TABLET | ORAL | Status: DC | PRN

## 2015-02-15 NOTE — Patient Instructions (Signed)
Diarrhea  Treatment - you should: - Take the Imodium as needed. He can take this up to 8 times a day (or every 3 hours). - [facility] as needed for your cough. - Take Tylenol for any fevers, body aches, or pain. - Stay well-hydrated with water. Try to avoid sugary beverages and foods that are high in fat/sugar.  You should be better in: 5-7 days Call us or go to the ER if you become dehydrated (no urination or lightheaded) or have severe abdominal pain or bleeding or  notice blood in your stool. Come back to see us if your symptoms persist or worsen over the next 7 days.

## 2015-02-15 NOTE — Assessment & Plan Note (Signed)
Patient signs and symptoms consistent with viral process. Initial symptoms consisted of cough and sore throat making viral URI a likely cause. However patient is most concerned with the diarrhea she is experiencing, which could make viral gastroenteritis a possibility as well. Will treat conservatively with symptom management. - Tylenol for fever/pain/body aches - Prescribed Tessalon Perles for cough - Prescribed Imodium for diarrhea - Encouraged high fluid intake with water/non-sugary beverages. - Encouraged low fat diet as tolerated - Follow-up PRN

## 2015-02-15 NOTE — Progress Notes (Signed)
DIARRHEA Coughing and sore throat started yesterday, but she is mostly concerned w/ diarrhea which started today. Cough is dry. 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   Having diarrhea for 1 days Progression: constant Stools per day: 6-8 Does diarrhea wake patient: yes Medications tried: no Recent travel: no Sick contacts: no Ingested suspicious foods: no Antibiotics recently: no Immunocompromised: no  Symptoms Vomiting: no, but has had dry heaving Abdominal pain: yes Weight Loss: no Decreased urine output: yes Lightheadedness: no Fever: no Bloody stools: no  ROS see HPI Smoking Status noted   Objective: BP 140/90 mmHg  Pulse 78  Temp(Src) 98.8 F (37.1 C) (Oral)  Ht 5\' 5"  (1.651 m)  Wt 300 lb (136.079 kg)  BMI 49.92 kg/m2  SpO2 97% Gen: NAD, alert, cooperative, and pleasant. HEENT: NCAT, EOMI, PERRL, OP erythematous without exudate CV: RRR, no murmur Resp: CTAB, no wheezes, non-labored Abd: S, some epigastric and left lower quadrant discomfort to deep palpation, ND, BS wnl, no guarding or organomegaly  Assessment and plan:  Viral syndrome Patient signs and symptoms consistent with viral process. Initial symptoms consisted of cough and sore throat making viral URI a likely cause. However patient is most concerned with the diarrhea she is experiencing, which could make viral gastroenteritis a possibility as well. Will treat conservatively with symptom management. - Tylenol for fever/pain/body aches - Prescribed Tessalon Perles for cough - Prescribed Imodium for diarrhea - Encouraged high fluid intake with water/non-sugary beverages. - Encouraged low fat diet as tolerated - Follow-up PRN   Meds ordered this encounter  Medications  . benzonatate (TESSALON) 200 MG capsule    Sig: Take 1 capsule (200 mg total) by mouth 3 (three) times daily as  needed for cough.    Dispense:  30 capsule    Refill:  0  . Loperamide HCl 2 MG CHEW    Sig: Chew 1 tablet (2 mg total) by mouth every 3 (three) hours as needed.    Dispense:  40 tablet    Refill:  0     Kathee DeltonIan D McKeag, MD,MS,  PGY2 02/15/2015 6:28 PM

## 2015-04-08 ENCOUNTER — Encounter: Payer: Self-pay | Admitting: Family Medicine

## 2015-04-08 ENCOUNTER — Ambulatory Visit (INDEPENDENT_AMBULATORY_CARE_PROVIDER_SITE_OTHER): Payer: Medicaid Other | Admitting: Family Medicine

## 2015-04-08 VITALS — BP 159/95 | HR 88 | Temp 99.5°F | Wt 300.0 lb

## 2015-04-08 DIAGNOSIS — J019 Acute sinusitis, unspecified: Secondary | ICD-10-CM | POA: Diagnosis not present

## 2015-04-08 DIAGNOSIS — B9689 Other specified bacterial agents as the cause of diseases classified elsewhere: Secondary | ICD-10-CM

## 2015-04-08 DIAGNOSIS — J020 Streptococcal pharyngitis: Secondary | ICD-10-CM | POA: Diagnosis present

## 2015-04-08 MED ORDER — ONDANSETRON 8 MG PO TBDP: 8.0000 mg | ORAL_TABLET | Freq: Three times a day (TID) | ORAL | Status: DC | PRN

## 2015-04-08 MED ORDER — AMOXICILLIN-POT CLAVULANATE 875-125 MG PO TABS: 1.0000 | ORAL_TABLET | Freq: Two times a day (BID) | ORAL | Status: DC

## 2015-04-08 NOTE — Progress Notes (Signed)
URI Was feeling nauseated at work today. No vomiting. Pain in throat and sinuses. Started ~7 days ago, getting progressively worse.   Has been sick for ~7 days. Nasal discharge: none, but congested Medications tried: DayQuil, mucinex Sick contacts: kids had pinkeye and ear infection last week  Symptoms Fever: no Headache or face pain: yes Tooth pain: some Sneezing: yes Scratchy throat: yes Allergies: yes Muscle aches: no Severe fatigue: yes Stiff neck: no Shortness of breath: no Rash: no Sore throat or swollen glands: yes  ROS see HPI Smoking Status noted  Objective: BP 159/95 mmHg  Pulse 88  Temp(Src) 99.5 F (37.5 C) (Oral)  Wt 300 lb (136.079 kg) Gen: NAD, alert, cooperative, appears uncomfortable HEENT: NCAT, EOMI, PERRL, OP erythematous, mucous visualized, no exudate. Nasal mucosa edematous. Maxillary sinus tenderness noted R>L. TMs clear, shotty LAD ant chain. CV: RRR, no murmur Resp: CTAB, no wheezes, non-labored Abd: SNTND, BS present, no guarding or organomegaly  Assessment and plan:  Acute bacterial sinusitis Patient presents w/ signs and sxs c/w sinusitis. Timeline does not r/o viral etiology at this time but w/ symptoms worsening over the past 24-48hrs I have made the decision to treat for a bacterial cause. Centor score = 1 - Augmentin x7 days - Zofran prn for nausea. - encouraged fluids and antipyretics for fever.    Meds ordered this encounter  Medications  . amoxicillin-clavulanate (AUGMENTIN) 875-125 MG tablet    Sig: Take 1 tablet by mouth 2 (two) times daily.    Dispense:  14 tablet    Refill:  0  . ondansetron (ZOFRAN ODT) 8 MG disintegrating tablet    Sig: Take 1 tablet (8 mg total) by mouth every 8 (eight) hours as needed for nausea or vomiting.    Dispense:  20 tablet    Refill:  0     Kathee DeltonIan D McKeag, MD,MS,  PGY2 04/12/2015 6:36 PM

## 2015-04-08 NOTE — Patient Instructions (Signed)

## 2015-04-12 DIAGNOSIS — J019 Acute sinusitis, unspecified: Secondary | ICD-10-CM

## 2015-04-12 DIAGNOSIS — B9689 Other specified bacterial agents as the cause of diseases classified elsewhere: Secondary | ICD-10-CM | POA: Insufficient documentation

## 2015-04-12 NOTE — Assessment & Plan Note (Addendum)
Patient presents w/ signs and sxs c/w sinusitis. Timeline does not r/o viral etiology at this time but w/ symptoms worsening over the past 24-48hrs I have made the decision to treat for a bacterial cause. Centor score = 1 - Augmentin x7 days - Zofran prn for nausea. - encouraged fluids and antipyretics for fever.

## 2015-07-06 ENCOUNTER — Ambulatory Visit (INDEPENDENT_AMBULATORY_CARE_PROVIDER_SITE_OTHER): Payer: Medicaid Other | Admitting: Family Medicine

## 2015-07-06 ENCOUNTER — Encounter: Payer: Self-pay | Admitting: Family Medicine

## 2015-07-06 VITALS — BP 172/97 | HR 86 | Temp 98.5°F | Wt 298.2 lb

## 2015-07-06 DIAGNOSIS — J019 Acute sinusitis, unspecified: Secondary | ICD-10-CM

## 2015-07-06 DIAGNOSIS — I10 Essential (primary) hypertension: Secondary | ICD-10-CM | POA: Diagnosis not present

## 2015-07-06 DIAGNOSIS — B9689 Other specified bacterial agents as the cause of diseases classified elsewhere: Secondary | ICD-10-CM

## 2015-07-06 MED ORDER — AMOXICILLIN-POT CLAVULANATE 875-125 MG PO TABS: 1.0000 | ORAL_TABLET | Freq: Two times a day (BID) | ORAL | Status: DC

## 2015-07-06 NOTE — Progress Notes (Signed)
Patient ID: Holly [name], female   DOB: 13-Sep-1980, 35 y.o.   MRN: [id number]   [facility] Yolande Jollyaleb G Lucilia Yanni, MD Phone: [phone]  Subjective:   # Sinus Inection - Does have allergy issues at baseline.  - Takes zyrtec to help whenever she feels that she is having an allergy issue.  - has had significant sinus pain and pressure for 5-6 days.  - has been unable to take oral temp, has had subjective fevers - generalized malaise, body aches.  - son has had bronchitis a few weeks ago, but otherwise no sick contacts - has ear pain on the right side.  - constant nasal and throat drainage.  - has had throat pain mostly in the morning with post nasal drip.  - no swollen lymph nodes, unsure though.  - sneezing, but no cough.  - No purulent nasal discharge.  - Is having headaches, and sometimes is light headed.  - no shortness of breath or difficulty breathing, hard to breathe through nasopharynx.  - Has been trying nasal saline, salt water gargle, hot steam, has been taking ibuprofen - Has not taken any decongestant, no mucinex - Does not smoke - Does not drink ETOH.  - Does not have asthma.  - Blood pressure is high, no current headache, vision changes, chest pain, or Le edema.   All relevant systems were reviewed and were negative unless otherwise noted in the HPI  Past Medical History Reviewed problem list.  Medications- reviewed and updated No current outpatient prescriptions on file.   No current facility-administered medications for this visit.   Chief complaint-noted No additions to family history Social history- patient is a non smoker  Objective: BP 172/97 mmHg  Pulse 86  Temp(Src) 98.5 F (36.9 C) (Oral)  Wt 298 lb 3.2 oz (135.263 kg) Gen: NAD, alert, cooperative with exam HEENT: NCAT, EOMI, PERRL, TMs nml with small amount of fluid, mild sinus TTP, Nasal discharge, mild O/P erythema. No LAD.  Neck: FROM, supple, no LAD CV: RRR, good  S1/S2, no murmur Resp: CTABL, no wheezes, non-labored Abd: SNTND, BS present, no guarding or organomegaly Ext: No edema, warm, normal tone, moves UE/LE spontaneously Neuro: Alert and oriented, No gross deficits  Skin: no rashes no lesions  Assessment/Plan:  # Sinus Infection - length of time and symptoms create concern for bacterial sinus infection. Unable to treat with decongestant due to elevated BP.  - will treat with antibiotics for sinusitis.  - Supportive care, drink plenty of water.  - Take guifenisin to thin the mucus.  - Take ibuprofen 600mg  every 6 hours as needed.  - Take an antihistamine to help as well.  - Use a neti pot to help clean out your sinuses as well.  - Return for follow up if you develop worsening symptoms over the next 5-7 days, or if you feel a lot worse in the next few day.  - f/u with your pcp in 2-3 weeks.   # HTN - Bp significantly elevated today, has been elevated for the past 1-2 years, has had some discussion about this, but little intervention at this point. No headache, vision changes, or chest pain today.  - Needs to get her BP under control.  - Will have her f/u with pcp in 2-3 weeks to discuss starting medication while attempting lifestyle modification / weight loss.  - will need baseline kidney function etc at that visit.  - f/u in 1 week for BP check with Tamika.

## 2015-07-06 NOTE — Patient Instructions (Addendum)
Thanks for coming in today.   Make a follow up nursing appointment next week for blood pressure check.   Make a f/u appointment with pcp in 2-3 weeks to discuss blood pressure.   Use medications we disucssed, ibuprofen, guifenisen, dextromethorphan, netty pot.   Avoid decongestants.   Thanks for letting us take care of you.   Sincerely, Devota Pacealeb Matika Bartell, MD Family Medicine -PGY 2   Sinusitis, Adult Sinusitis is redness, soreness, and inflammation of the paranasal sinuses. Paranasal sinuses are air pockets within the bones of your face. They are located beneath your eyes, in the middle of your forehead, and above your eyes. In healthy paranasal sinuses, mucus is able to drain out, and air is able to circulate through them by way of your nose. However, when your paranasal sinuses are inflamed, mucus and air can become trapped. This can allow bacteria and other germs to grow and cause infection. Sinusitis can develop quickly and last only a short time (acute) or continue over a long period (chronic). Sinusitis that lasts for more than 12 weeks is considered chronic. CAUSES Causes of sinusitis include:  Allergies.  Structural abnormalities, such as displacement of the cartilage that separates your nostrils (deviated septum), which can decrease the air flow through your nose and sinuses and affect sinus drainage.  Functional abnormalities, such as when the small hairs (cilia) that line your sinuses and help remove mucus do not work properly or are not present. SIGNS AND SYMPTOMS Symptoms of acute and chronic sinusitis are the same. The primary symptoms are pain and pressure around the affected sinuses. Other symptoms include:  Upper toothache.  Earache.  Headache.  Bad breath.  Decreased sense of smell and taste.  A cough, which worsens when you are lying flat.  Fatigue.  Fever.  Thick drainage from your nose, which often is green and may contain pus (purulent).  Swelling  and warmth over the affected sinuses. DIAGNOSIS Your health care provider will perform a physical exam. During your exam, your health care provider may perform any of the following to help determine if you have acute sinusitis or chronic sinusitis:  Look in your nose for signs of abnormal growths in your nostrils (nasal polyps).  Tap over the affected sinus to check for signs of infection.  View the inside of your sinuses using an imaging device that has a light attached (endoscope). If your health care provider suspects that you have chronic sinusitis, one or more of the following tests may be recommended:  Allergy tests.  Nasal culture. A sample of mucus is taken from your nose, sent to a lab, and screened for bacteria.  Nasal cytology. A sample of mucus is taken from your nose and examined by your health care provider to determine if your sinusitis is related to an allergy. TREATMENT Most cases of acute sinusitis are related to a viral infection and will resolve on their own within 10 days. Sometimes, medicines are prescribed to help relieve symptoms of both acute and chronic sinusitis. These may include pain medicines, decongestants, nasal steroid sprays, or saline sprays. However, for sinusitis related to a bacterial infection, your health care provider will prescribe antibiotic medicines. These are medicines that will help kill the bacteria causing the infection. Rarely, sinusitis is caused by a fungal infection. In these cases, your health care provider will prescribe antifungal medicine. For some cases of chronic sinusitis, surgery is needed. Generally, these are cases in which sinusitis recurs more than 3 times per year, despite  other treatments. HOME CARE INSTRUCTIONS  Drink plenty of water. Water helps thin the mucus so your sinuses can drain more easily.  Use a humidifier.  Inhale steam 3-4 times a day (for example, sit in the bathroom with the shower running).  Apply a warm,  moist washcloth to your face 3-4 times a day, or as directed by your health care provider.  Use saline nasal sprays to help moisten and clean your sinuses.  Take medicines only as directed by your health care provider.  If you were prescribed either an antibiotic or antifungal medicine, finish it all even if you start to feel better. SEEK IMMEDIATE MEDICAL CARE IF:  You have increasing pain or severe headaches.  You have nausea, vomiting, or drowsiness.  You have swelling around your face.  You have vision problems.  You have a stiff neck.  You have difficulty breathing.   This information is not intended to replace advice given to you by your health care provider. Make sure you discuss any questions you have with your health care provider.   Document Released: 04/03/2005 Document Revised: 04/24/2014 Document Reviewed: 04/18/2011 Elsevier Interactive Patient Education 2016 [employer].   Hypertension Hypertension, commonly called high blood pressure, is when the force of blood pumping through your arteries is too strong. Your arteries are the blood vessels that carry blood from your heart throughout your body. A blood pressure reading consists of a higher number over a lower number, such as 110/72. The higher number (systolic) is the pressure inside your arteries when your heart pumps. The lower number (diastolic) is the pressure inside your arteries when your heart relaxes. Ideally you want your blood pressure below 120/80. Hypertension forces your heart to work harder to pump blood. Your arteries may become narrow or stiff. Having untreated or uncontrolled hypertension can cause heart attack, stroke, kidney disease, and other problems. RISK FACTORS Some risk factors for high blood pressure are controllable. Others are not.  Risk factors you cannot control include:   Race. You may be at higher risk if you are African American.  Age. Risk increases with age.  Gender. Men are  at higher risk than women before age 35 years. After age 38, women are at higher risk than men. Risk factors you can control include:  Not getting enough exercise or physical activity.  Being overweight.  Getting too much fat, sugar, calories, or salt in your diet.  Drinking too much alcohol. SIGNS AND SYMPTOMS Hypertension does not usually cause signs or symptoms. Extremely high blood pressure (hypertensive crisis) may cause headache, anxiety, shortness of breath, and nosebleed. DIAGNOSIS To check if you have hypertension, your health care provider will measure your blood pressure while you are seated, with your arm held at the level of your heart. It should be measured at least twice using the same arm. Certain conditions can cause a difference in blood pressure between your right and left arms. A blood pressure reading that is higher than normal on one occasion does not mean that you need treatment. If it is not clear whether you have high blood pressure, you may be asked to return on a different day to have your blood pressure checked again. Or, you may be asked to monitor your blood pressure at home for 1 or more weeks. TREATMENT Treating high blood pressure includes making lifestyle changes and possibly taking medicine. Living a healthy lifestyle can help lower high blood pressure. You may need to change some of your habits. Lifestyle  changes may include:  Following the DASH diet. This diet is high in fruits, vegetables, and whole grains. It is low in salt, red meat, and added sugars.  Keep your sodium intake below 2,300 mg per day.  Getting at least 30-45 minutes of aerobic exercise at least 4 times per week.  Losing weight if necessary.  Not smoking.  Limiting alcoholic beverages.  Learning ways to reduce stress. Your health care provider may prescribe medicine if lifestyle changes are not enough to get your blood pressure under control, and if one of the following is  true:  You are 22-9 years of age and your systolic blood pressure is above 140.  You are 17 years of age or older, and your systolic blood pressure is above 150.  Your diastolic blood pressure is above 90.  You have diabetes, and your systolic blood pressure is over 140 or your diastolic blood pressure is over 90.  You have kidney disease and your blood pressure is above 140/90.  You have heart disease and your blood pressure is above 140/90. Your personal target blood pressure may vary depending on your medical conditions, your age, and other factors. HOME CARE INSTRUCTIONS  Have your blood pressure rechecked as directed by your health care provider.   Take medicines only as directed by your health care provider. Follow the directions carefully. Blood pressure medicines must be taken as prescribed. The medicine does not work as well when you skip doses. Skipping doses also puts you at risk for problems.  Do not smoke.   Monitor your blood pressure at home as directed by your health care provider. SEEK MEDICAL CARE IF:   You think you are having a reaction to medicines taken.  You have recurrent headaches or feel dizzy.  You have swelling in your ankles.  You have trouble with your vision. SEEK IMMEDIATE MEDICAL CARE IF:  You develop a severe headache or confusion.  You have unusual weakness, numbness, or feel faint.  You have severe chest or abdominal pain.  You vomit repeatedly.  You have trouble breathing. MAKE SURE YOU:   Understand these instructions.  Will watch your condition.  Will get help right away if you are not doing well or get worse.   This information is not intended to replace advice given to you by your health care provider. Make sure you discuss any questions you have with your health care provider.   Document Released: 04/03/2005 Document Revised: 08/18/2014 Document Reviewed: 01/24/2013 Elsevier Interactive Patient Education [employer].

## 2015-07-13 ENCOUNTER — Ambulatory Visit (INDEPENDENT_AMBULATORY_CARE_PROVIDER_SITE_OTHER): Payer: Medicaid Other | Admitting: *Deleted

## 2015-07-13 VITALS — BP 170/92 | HR 75

## 2015-07-13 DIAGNOSIS — Z0130 Encounter for examination of blood pressure without abnormal findings: Secondary | ICD-10-CM

## 2015-07-13 DIAGNOSIS — I10 Essential (primary) hypertension: Secondary | ICD-10-CM

## 2015-07-13 DIAGNOSIS — Z136 Encounter for screening for cardiovascular disorders: Secondary | ICD-10-CM

## 2015-07-13 NOTE — Progress Notes (Signed)
   Patient in nurse clinic for blood pressure check.  Patient denies chest pain, SOB, visual changes, dizziness, numbness/tingling in extremities. Patient also stated she has occasional headache that is sometimes relieved by Ibuprofen. Patient currently is not taking any blood pressure medication.  Diet and exercise advised until she has her office visit with PCP.  Advised patient to call for an appointment or go to ED if she develops chest pain, visual changes, n/v, SOB, dizziness and headache worsen.  Patient is checking log of blood pressures taken at home.  Patient to bring in log at next appointment.  Patient stated understanding.  Follow up appointment scheduled 07/24/15.  Will forward PCP.   Today's Vitals   07/13/15 1102 07/13/15 1103 07/13/15 1116  BP: 170/90  170/92  Pulse: 75  75  SpO2: 97%    PainSc: 2  2    PainLoc: Head      Clovis PuMartin, Tamika L, RN

## 2015-07-21 ENCOUNTER — Encounter: Payer: Self-pay | Admitting: Family Medicine

## 2015-07-21 ENCOUNTER — Ambulatory Visit (INDEPENDENT_AMBULATORY_CARE_PROVIDER_SITE_OTHER): Payer: Medicaid Other | Admitting: Family Medicine

## 2015-07-21 VITALS — BP 181/101 | HR 87 | Temp 98.4°F | Ht 65.0 in | Wt 299.6 lb

## 2015-07-21 DIAGNOSIS — E669 Obesity, unspecified: Secondary | ICD-10-CM | POA: Diagnosis not present

## 2015-07-21 DIAGNOSIS — I10 Essential (primary) hypertension: Secondary | ICD-10-CM

## 2015-07-21 MED ORDER — HYDROCHLOROTHIAZIDE 25 MG PO TABS: 25.0000 mg | ORAL_TABLET | Freq: Every day | ORAL | Status: DC

## 2015-07-21 NOTE — Patient Instructions (Signed)
Hypertension Hypertension, commonly called high blood pressure, is when the force of blood pumping through your arteries is too strong. Your arteries are the blood vessels that carry blood from your heart throughout your body. A blood pressure reading consists of a higher number over a lower number, such as 110/72. The higher number (systolic) is the pressure inside your arteries when your heart pumps. The lower number (diastolic) is the pressure inside your arteries when your heart relaxes. Ideally you want your blood pressure below 120/80. Hypertension forces your heart to work harder to pump blood. Your arteries may become narrow or stiff. Having untreated or uncontrolled hypertension can cause heart attack, stroke, kidney disease, and other problems. RISK FACTORS Some risk factors for high blood pressure are controllable. Others are not.  Risk factors you cannot control include:   Race. You may be at higher risk if you are African American.  Age. Risk increases with age.  Gender. Men are at higher risk than women before age 45 years. After age 65, women are at higher risk than men. Risk factors you can control include:  Not getting enough exercise or physical activity.  Being overweight.  Getting too much fat, sugar, calories, or salt in your diet.  Drinking too much alcohol. SIGNS AND SYMPTOMS Hypertension does not usually cause signs or symptoms. Extremely high blood pressure (hypertensive crisis) may cause headache, anxiety, shortness of breath, and nosebleed. DIAGNOSIS To check if you have hypertension, your health care provider will measure your blood pressure while you are seated, with your arm held at the level of your heart. It should be measured at least twice using the same arm. Certain conditions can cause a difference in blood pressure between your right and left arms. A blood pressure reading that is higher than normal on one occasion does not mean that you need treatment. If  it is not clear whether you have high blood pressure, you may be asked to return on a different day to have your blood pressure checked again. Or, you may be asked to monitor your blood pressure at home for 1 or more weeks. TREATMENT Treating high blood pressure includes making lifestyle changes and possibly taking medicine. Living a healthy lifestyle can help lower high blood pressure. You may need to change some of your habits. Lifestyle changes may include:  Following the DASH diet. This diet is high in fruits, vegetables, and whole grains. It is low in salt, red meat, and added sugars.  Keep your sodium intake below 2,300 mg per day.  Getting at least 30-45 minutes of aerobic exercise at least 4 times per week.  Losing weight if necessary.  Not smoking.  Limiting alcoholic beverages.  Learning ways to reduce stress. Your health care provider may prescribe medicine if lifestyle changes are not enough to get your blood pressure under control, and if one of the following is true:  You are 18-59 years of age and your systolic blood pressure is above 140.  You are 60 years of age or older, and your systolic blood pressure is above 150.  Your diastolic blood pressure is above 90.  You have diabetes, and your systolic blood pressure is over 140 or your diastolic blood pressure is over 90.  You have kidney disease and your blood pressure is above 140/90.  You have heart disease and your blood pressure is above 140/90. Your personal target blood pressure may vary depending on your medical conditions, your age, and other factors. HOME CARE INSTRUCTIONS    Have your blood pressure rechecked as directed by your health care provider.   Take medicines only as directed by your health care provider. Follow the directions carefully. Blood pressure medicines must be taken as prescribed. The medicine does not work as well when you skip doses. Skipping doses also puts you at risk for  problems.  Do not smoke.   Monitor your blood pressure at home as directed by your health care provider. SEEK MEDICAL CARE IF:   You think you are having a reaction to medicines taken.  You have recurrent headaches or feel dizzy.  You have swelling in your ankles.  You have trouble with your vision. SEEK IMMEDIATE MEDICAL CARE IF:  You develop a severe headache or confusion.  You have unusual weakness, numbness, or feel faint.  You have severe chest or abdominal pain.  You vomit repeatedly.  You have trouble breathing. MAKE SURE YOU:   Understand these instructions.  Will watch your condition.  Will get help right away if you are not doing well or get worse.   This information is not intended to replace advice given to you by your health care provider. Make sure you discuss any questions you have with your health care provider.   Document Released: 04/03/2005 Document Revised: 08/18/2014 Document Reviewed: 01/24/2013 Elsevier Interactive Patient Education 2016 [employer] Inc[employer]  

## 2015-07-21 NOTE — Assessment & Plan Note (Signed)
Encouraged ongoing diet and exercise interventions Discussed importance of healthy weight management and impact on blood pressure

## 2015-07-21 NOTE — Assessment & Plan Note (Signed)
Blood pressure continues to be elevated Encourage patient to continue diet and exercise interventions No red flags Start HCTZ 25 mg daily BMP, lipid panel, A1c today Follow-up in 1-2 weeks

## 2015-07-21 NOTE — Progress Notes (Signed)
   Subjective:   Sharman Cheeklena K Mccaster is a 35 y.o. female with a history of Obesity, elevated blood pressure readings here for blood pressure follow-up.  Elevated BP: - Medications: none, no previous medications - Checking BP at home: running in 150s-160s/100s at home - tryign to work diet (more greens and smoothies, less salt) and more exercise (dance 4-5 times per week, >1h) - Denies any SOB, CP, vision changes, LE edema, medication SEs, or symptoms of hypotension - BP recently 170s over 90s during RN visit on 3/28  Review of Systems:  Per HPI.   Social History: Never smoker  Objective:  BP 181/101 mmHg  Pulse 87  Temp(Src) 98.4 F (36.9 C) (Oral)  Ht 5\' 5"  (1.651 m)  Wt 299 lb 9.6 oz (135.898 kg)  BMI 49.86 kg/m2  Gen:  35 y.o. female in NAD HEENT: NCAT, MMM, EOMI, PERRL, anicteric sclerae CV: RRR, no MRG Resp: Non-labored, CTAB, no wheezes noted Ext: WWP, no edema MSK: No obvious deformities Neuro: Alert and oriented, speech normal     Assessment & Plan:     Sharman Cheeklena K Sabbagh is a 35 y.o. female here for Blood pressure follow-up  HTN (hypertension) Blood pressure continues to be elevated Encourage patient to continue diet and exercise interventions No red flags Start HCTZ 25 mg daily BMP, lipid panel, A1c today Follow-up in 1-2 weeks  Obesity Encouraged ongoing diet and exercise interventions Discussed importance of healthy weight management and impact on blood pressure     Erasmo DownerAngela M Rupal Childress, MD MPH PGY-2,  [facility] 07/21/2015  4:51 PM

## 2015-08-05 ENCOUNTER — Encounter: Payer: Self-pay | Admitting: Family Medicine

## 2015-08-05 ENCOUNTER — Ambulatory Visit (INDEPENDENT_AMBULATORY_CARE_PROVIDER_SITE_OTHER): Payer: Medicaid Other | Admitting: Family Medicine

## 2015-08-05 VITALS — BP 172/98 | HR 86 | Temp 98.1°F | Ht 65.5 in | Wt 300.1 lb

## 2015-08-05 DIAGNOSIS — E669 Obesity, unspecified: Secondary | ICD-10-CM | POA: Diagnosis not present

## 2015-08-05 DIAGNOSIS — I10 Essential (primary) hypertension: Secondary | ICD-10-CM

## 2015-08-05 LAB — COMPLETE METABOLIC PANEL WITH GFR
ALBUMIN: 4 g/dL (ref 3.6–5.1)
ALK PHOS: 72 U/L (ref 33–115)
ALT: 32 U/L — ABNORMAL HIGH (ref 6–29)
AST: 28 U/L (ref 10–30)
BILIRUBIN TOTAL: 0.7 mg/dL (ref 0.2–1.2)
BUN: 7 mg/dL (ref 7–25)
CO2: 30 mmol/L (ref 20–31)
Calcium: 9.7 mg/dL (ref 8.6–10.2)
Chloride: 97 mmol/L — ABNORMAL LOW (ref 98–110)
Creat: 0.71 mg/dL (ref 0.50–1.10)
Glucose, Bld: 96 mg/dL (ref 65–99)
Potassium: 3.9 mmol/L (ref 3.5–5.3)
Sodium: 136 mmol/L (ref 135–146)
TOTAL PROTEIN: 7.2 g/dL (ref 6.1–8.1)

## 2015-08-05 LAB — LIPID PANEL
CHOLESTEROL: 163 mg/dL (ref 125–200)
HDL: 30 mg/dL — AB (ref >=46)
LDL Cholesterol: 107 mg/dL (ref <130)
TRIGLYCERIDES: 128 mg/dL (ref <150)
Total CHOL/HDL Ratio: 5.4 Ratio — ABNORMAL HIGH (ref <=5.0)
VLDL: 26 mg/dL (ref <30)

## 2015-08-05 LAB — POCT GLYCOSYLATED HEMOGLOBIN (HGB A1C): HEMOGLOBIN A1C: 6

## 2015-08-05 MED ORDER — AMLODIPINE BESYLATE 5 MG PO TABS: 5.0000 mg | ORAL_TABLET | Freq: Every day | ORAL | Status: DC

## 2015-08-05 NOTE — Assessment & Plan Note (Signed)
Poorly controlled, asymptomatic Continue HCTZ 5 mg daily Start amlodipine 5 mg daily A1c, lipid panel, CMET today F/u 2 wks

## 2015-08-05 NOTE — Progress Notes (Signed)
   Subjective:   Holly [name] is a 35 y.o. female with a history of obesity, hypertension here for hypertension follow-up.  HTN: - seen 4/5 and started on HCTZ 25mg  daily  - was supposed to get labs, but they were not collected at last visit - Medications: HCTZ 25mg  daily - Compliance: good - Checking BP at home: yes, 140s/90s - Denies any SOB, CP, vision changes, LE edema, medication SEs, or symptoms of hypotension - Diet: cereal/shake in AM, salad, baked fish/chicken - started taking herbalife supplement pill - Exercise: dancing daily, but has taken break in last few days   Review of Systems:  Per HPI.   Social History: never smoker  Objective:  BP 172/98 mmHg  Pulse 86  Temp(Src) 98.1 F (36.7 C) (Oral)  Ht 5' 5.5" (1.664 m)  Wt 300 lb 1.6 oz (136.124 kg)  BMI 49.16 kg/m2  SpO2 98%  Gen:  35 y.o. female in NAD HEENT: NCAT, MMM, EOMI, PERRL, anicteric sclerae CV: RRR, no MRG Resp: Non-labored, CTAB, no wheezes noted Abd: Soft, NTND, BS present, no guarding or organomegaly Ext: WWP, no edema MSK: no obvious deformities Neuro: Alert and oriented, speech normal     Assessment & Plan:     Holly [name] is a 35 y.o. female here for HTN follow-up  HTN (hypertension) Poorly controlled, asymptomatic Continue HCTZ 5 mg daily Start amlodipine 5 mg daily A1c, lipid panel, CMET today F/u 2 wks    Erasmo DownerAngela M Amour Trigg, MD MPH PGY-2,  [facility] 08/05/2015  3:09 PM

## 2015-08-05 NOTE — Patient Instructions (Signed)
Nice to see you again today. Continue taking HCTZ 25 mg daily.  We will also start a medication called amlodipine 5 mg daily as your blood pressure is still elevated. We are getting some labs today and someone will call you or send you a letter with the results when they are available.  Take care, Dr. BLeonard Schwartz

## 2015-08-06 ENCOUNTER — Encounter: Payer: Self-pay | Admitting: Family Medicine

## 2016-02-01 ENCOUNTER — Encounter (HOSPITAL_COMMUNITY): Payer: Self-pay | Admitting: *Deleted

## 2016-02-01 ENCOUNTER — Emergency Department (HOSPITAL_COMMUNITY)
Admission: EM | Admit: 2016-02-01 | Discharge: 2016-02-01 | Disposition: A | Discharge status: Home / Self Care | Payer: Medicaid Other | Attending: Emergency Medicine | Admitting: Emergency Medicine

## 2016-02-01 DIAGNOSIS — Z7982 Long term (current) use of aspirin: Secondary | ICD-10-CM | POA: Diagnosis not present

## 2016-02-01 DIAGNOSIS — I10 Essential (primary) hypertension: Secondary | ICD-10-CM

## 2016-02-01 DIAGNOSIS — I159 Secondary hypertension, unspecified: Secondary | ICD-10-CM | POA: Diagnosis not present

## 2016-02-01 DIAGNOSIS — R42 Dizziness and giddiness: Secondary | ICD-10-CM | POA: Insufficient documentation

## 2016-02-01 LAB — BASIC METABOLIC PANEL
Anion gap: 8 (ref 5–15)
BUN: 6 mg/dL (ref 6–20)
CHLORIDE: 100 mmol/L — AB (ref 101–111)
CO2: 26 mmol/L (ref 22–32)
CREATININE: 0.84 mg/dL (ref 0.44–1.00)
Calcium: 9.1 mg/dL (ref 8.9–10.3)
GFR calc Af Amer: >60 mL/min (ref >60)
GFR calc non Af Amer: >60 mL/min (ref >60)
Glucose, Bld: 107 mg/dL — ABNORMAL HIGH (ref 65–99)
POTASSIUM: 3.6 mmol/L (ref 3.5–5.1)
SODIUM: 134 mmol/L — AB (ref 135–145)

## 2016-02-01 LAB — CBC
HEMATOCRIT: 41.8 % (ref 36.0–46.0)
Hemoglobin: 13.8 g/dL (ref 12.0–15.0)
MCH: 27.9 pg (ref 26.0–34.0)
MCHC: 33 g/dL (ref 30.0–36.0)
MCV: 84.4 fL (ref 78.0–100.0)
Platelets: 326 10*3/uL (ref 150–400)
RBC: 4.95 MIL/uL (ref 3.87–5.11)
RDW: 13.6 % (ref 11.5–15.5)
WBC: 11.2 10*3/uL — AB (ref 4.0–10.5)

## 2016-02-01 LAB — TROPONIN I: Troponin I: <0.03 ng/mL (ref <0.03)

## 2016-02-01 LAB — CBG MONITORING, ED: Glucose-Capillary: 63 mg/dL — ABNORMAL LOW (ref 65–99)

## 2016-02-01 MED ORDER — HYDROCHLOROTHIAZIDE 25 MG PO TABS
25.0000 mg | ORAL_TABLET | Freq: Every day | ORAL | Status: DC
  Administered 2016-02-01: 25 mg via ORAL
  Filled 2016-02-01: qty 1

## 2016-02-01 MED ORDER — MAGNESIUM SULFATE 2 GM/50ML IV SOLN
2.0000 g | Freq: Once | INTRAVENOUS | Status: AC
  Administered 2016-02-01: 2 g via INTRAVENOUS
  Filled 2016-02-01: qty 50

## 2016-02-01 MED ORDER — LABETALOL HCL 5 MG/ML IV SOLN: 20.0000 mg | Freq: Once | INTRAVENOUS | Status: DC

## 2016-02-01 MED ORDER — METOCLOPRAMIDE HCL 5 MG/ML IJ SOLN
10.0000 mg | Freq: Once | INTRAMUSCULAR | Status: AC
  Administered 2016-02-01: 10 mg via INTRAVENOUS
  Filled 2016-02-01: qty 2

## 2016-02-01 MED ORDER — AMLODIPINE BESYLATE 5 MG PO TABS: 5.0000 mg | ORAL_TABLET | Freq: Every day | ORAL | 0 refills | Status: DC

## 2016-02-01 MED ORDER — SODIUM CHLORIDE 0.9 % IV BOLUS (SEPSIS)
1000.0000 mL | Freq: Once | INTRAVENOUS | Status: AC
  Administered 2016-02-01: 1000 mL via INTRAVENOUS

## 2016-02-01 MED ORDER — AMLODIPINE BESYLATE 5 MG PO TABS
5.0000 mg | ORAL_TABLET | Freq: Once | ORAL | Status: AC
  Administered 2016-02-01: 5 mg via ORAL
  Filled 2016-02-01: qty 1

## 2016-02-01 MED ORDER — HYDROCHLOROTHIAZIDE 25 MG PO TABS: 25.0000 mg | ORAL_TABLET | Freq: Every day | ORAL | 0 refills | Status: DC

## 2016-02-01 NOTE — ED Triage Notes (Signed)
Patient c/o SOB, nausea and dizziness which began around 1 hour PTA.  Patient denies chest pain and changes in vision.

## 2016-02-01 NOTE — ED Provider Notes (Signed)
[facility]-EMERGENCY DEPT Provider Note   CSN: [id number] Arrival date & time: 02/01/16  1239     History   Chief Complaint Chief Complaint  Patient presents with  . Dizziness    HPI Holly [name] is a 35 y.o. female.  The history is provided by the patient.  Dizziness  Quality:  Lightheadedness Severity:  Moderate Onset quality:  Gradual Duration:  6 hours Timing:  Constant Progression:  Waxing and waning Chronicity:  New Context: head movement and standing up   Relieved by:  Nothing Worsened by:  Movement, standing up and turning head Ineffective treatments:  None tried Associated symptoms: headaches, nausea and shortness of breath   Associated symptoms: no blood in stool, no chest pain, no diarrhea, no palpitations, no syncope, no tinnitus, no vision changes and no vomiting   Headaches:    Severity:  Moderate   Onset quality:  Gradual   Timing:  Constant   Progression:  Unchanged   Chronicity:  Recurrent Risk factors: no new medications    Pt reports only sleeping 5-6 hours per night for the last month since she started new job.   Past Medical History:  Diagnosis Date  . Eczema    Hx: of  . Pregnancy   . Pregnancy induced hypertension   . Seasonal allergies    Hx: of    Patient Active Problem List   Diagnosis Date Noted  . Back pain 09/30/2012  . Annual physical exam 09/30/2012  . HTN (hypertension) 09/30/2012  . S/P cholecystectomy 08/29/2012  . Obesity 04/15/2012    Past Surgical History:  Procedure Laterality Date  . c-sectionx1    . CESAREAN SECTION    . CHOLECYSTECTOMY N/A 08/30/2012   Procedure: LAPAROSCOPIC CHOLECYSTECTOMY WITH INTRAOPERATIVE CHOLANGIOGRAM;  Surgeon: Shelly Rubenstein, MD;  Location: [facility] OR;  Service: General;  Laterality: N/A;  . NO PAST SURGERIES    . UPPER GI ENDOSCOPY     Hx: of    OB History    Gravida Para Term Preterm AB Living   3 2 2   1 2    SAB TAB Ectopic Multiple Live Births   1       2       Home  Medications    Prior to Admission medications   Medication Sig Start Date End Date Taking? Authorizing Provider  aspirin 325 MG EC tablet Take 325 mg by mouth every 6 (six) hours as needed for pain.   Yes Historical Provider, MD  amLODipine (NORVASC) 5 MG tablet Take 1 tablet (5 mg total) by mouth daily. 02/01/16 03/02/16  Nira Conn, MD  hydrochlorothiazide (HYDRODIURIL) 25 MG tablet Take 1 tablet (25 mg total) by mouth daily. 02/01/16 03/02/16  Nira Conn, MD    Family History Family History  Problem Relation Age of Onset  . Hypertension Father   . Hyperlipidemia Father   . Lung cancer Paternal Grandmother   . Diabetes Maternal Grandfather   . Kidney disease Paternal Uncle   . Colon cancer Neg Hx   . Esophageal cancer Neg Hx   . Stomach cancer Neg Hx     Social History Social History  Substance Use Topics  . Smoking status: Never Smoker  . Smokeless tobacco: Never Used  . Alcohol use No     Allergies   Naproxen sodium   Review of Systems Review of Systems  Constitutional: Negative for chills and fever.  HENT: Positive for congestion, postnasal drip and rhinorrhea. Negative for tinnitus.  Respiratory: Positive for cough and shortness of breath.   Cardiovascular: Negative for chest pain, palpitations and syncope.  Gastrointestinal: Positive for nausea. Negative for blood in stool, diarrhea and vomiting.  Genitourinary: Positive for frequency.  Neurological: Positive for dizziness and headaches.  All other systems reviewed and are negative.    Physical Exam Updated Vital Signs BP 152/99 (BP Location: Left Arm)   Pulse 105   Temp 98.7 F (37.1 C)   Resp (!) 105   Ht 5\' 5"  (1.651 m)   Wt (!) 313 lb 5 oz (142.1 kg)   SpO2 100%   BMI 52.14 kg/m   Physical Exam  Constitutional: She is oriented to person, place, and time. She appears well-developed and well-nourished. No distress.  HENT:  Head: Normocephalic and atraumatic.  Nose: Nose  normal.  Eyes: Conjunctivae and EOM are normal. Pupils are equal, round, and reactive to light. Right eye exhibits no discharge. Left eye exhibits no discharge. No scleral icterus.  Neck: Normal range of motion. Neck supple.  Cardiovascular: Normal rate and regular rhythm.  Exam reveals no gallop and no friction rub.   No murmur heard. Pulmonary/Chest: Effort normal and breath sounds normal. No stridor. No respiratory distress. She has no rales.  Abdominal: Soft. She exhibits no distension. There is no tenderness.  Musculoskeletal: She exhibits no edema or tenderness.  Neurological: She is alert and oriented to person, place, and time.  Mental Status: Alert and oriented to person, place, and time. Attention and concentration normal. Speech clear. Recent memory is intac  Cranial Nerves  II Visual Fields: Intact to confrontation. Visual fields intact. III, IV, VI: Pupils equal and reactive to light and near. Full eye movement without nystagmus  V Facial Sensation: Normal. No weakness of masticatory muscles  VII: No facial weakness or asymmetry  VIII Auditory Acuity: Grossly normal  IX/X: The uvula is midline; the palate elevates symmetrically  XI: Normal sternocleidomastoid and trapezius strength  XII: The tongue is midline. No atrophy or fasciculations.   Motor System: Muscle Strength: 5/5 and symmetric in the upper and lower extremities. No pronation or drift.  Muscle Tone: Tone and muscle bulk are normal in the upper and lower extremities.   Reflexes: DTRs: 2+ and symmetrical in all four extremities. Plantar responses are flexor bilaterally.  Coordination: Intact finger-to-nose, heel-to-shin, and rapid alternating movements. No tremor.  Sensation: Intact to light touch. Negative Romberg test.  Gait: Routine and tandem gait are normal    Skin: Skin is warm and dry. No rash noted. She is not diaphoretic. No erythema.  Psychiatric: She has a normal mood and affect.  Vitals  reviewed.    ED Treatments / Results  Labs (all labs ordered are listed, but only abnormal results are displayed) Labs Reviewed  BASIC METABOLIC PANEL - Abnormal; Notable for the following:       Result Value   Sodium 134 (*)    Chloride 100 (*)    Glucose, Bld 107 (*)    All other components within normal limits  CBC - Abnormal; Notable for the following:    WBC 11.2 (*)    All other components within normal limits  CBG MONITORING, ED - Abnormal; Notable for the following:    Glucose-Capillary 63 (*)    All other components within normal limits  TROPONIN I    EKG  EKG Interpretation  Date/Time:  Tuesday February 01 2016 13:21:03 EDT Ventricular Rate:  105 PR Interval:    QRS Duration: 72 QT  Interval:  328 QTC Calculation: 434 R Axis:   -11 Text Interpretation:  Sinus tachycardia No significant change since last tracing Confirmed by [facility] MD, Conan Mcmanaway ([id number]) on 02/01/2016 3:21:20 PM       Radiology No results found.  Procedures Procedures (including critical care time)  Medications Ordered in ED Medications  hydrochlorothiazide (HYDRODIURIL) tablet 25 mg (25 mg Oral Given 02/01/16 1631)  sodium chloride 0.9 % bolus 1,000 mL (0 mLs Intravenous Stopped 02/01/16 1730)  magnesium sulfate IVPB 2 g 50 mL (0 g Intravenous Stopped 02/01/16 1647)  metoCLOPramide (REGLAN) injection 10 mg (10 mg Intravenous Given 02/01/16 1544)  amLODipine (NORVASC) tablet 5 mg (5 mg Oral Given 02/01/16 1631)     Initial Impression / Assessment and Plan / ED Course  I have reviewed the triage vital signs and the nursing notes.  Pertinent labs & imaging results that were available during my care of the patient were reviewed by me and considered in my medical decision making (see chart for details).  Clinical Course    Non focal neuro exam. No recent head trauma. No fever. Doubt meningitis. Doubt intracranial bleed. Doubt IIH. No indication for imaging.  Significant improvement in  the patient's symptomatology was IV fluids, antiemetic, magnesium and home blood pressure medicine.  The patient is safe for discharge with strict return precautions.   Final Clinical Impressions(s) / ED Diagnoses   Final diagnoses:  Lightheadedness  Secondary hypertension   Disposition: Discharge  Condition: Good  I have discussed the results, Dx and Tx plan with the patient who expressed understanding and agree(s) with the plan. Discharge instructions discussed at great length. The patient was given strict return precautions who verbalized understanding of the instructions. No further questions at time of discharge.    Current Discharge Medication List      Follow Up: primary care provider  Schedule an appointment as soon as possible for a visit  For close follow up to assess for continued management of your high blood pressure      Nira ConnPedro Eduardo Jannelly Bergren, MD 02/01/16 1754

## 2016-02-01 NOTE — ED Notes (Signed)
I attempted to collect labs and was unsuccessful. 

## 2016-02-03 ENCOUNTER — Encounter: Payer: Self-pay | Admitting: Student

## 2016-02-03 ENCOUNTER — Ambulatory Visit (INDEPENDENT_AMBULATORY_CARE_PROVIDER_SITE_OTHER): Payer: Medicaid Other | Admitting: Student

## 2016-02-03 DIAGNOSIS — L301 Dyshidrosis [pompholyx]: Secondary | ICD-10-CM | POA: Diagnosis present

## 2016-02-03 MED ORDER — CLOBETASOL PROP EMOLLIENT BASE 0.05 % EX CREA: 1.0000 "application " | TOPICAL_CREAM | Freq: Two times a day (BID) | CUTANEOUS | 0 refills | Status: AC

## 2016-02-03 NOTE — Assessment & Plan Note (Signed)
History and physical exam consistent with dyshidrotic eczema - will treat with clobetasol x2 weeks, precautions discussed - hydration counseling discussed - will follow in 2 weeks

## 2016-02-03 NOTE — Patient Instructions (Signed)
Follow up in 2 weeks Please use Clobetasol ointment on feet and hands as needed. DO NOT USE on other parts of body Moisturize with thick lotion followed by vaseline to trap moisture If you feel your rash is worsening, call the office If you have questions or concerns, call the office at [phone]

## 2016-02-03 NOTE — Progress Notes (Signed)
   Subjective:    Patient ID: Holly [name], female    DOB: 11-Oct-1980, 35 y.o.   MRN: [id number]   CC: Spider bites on feet  HPI: 35 year old female with history of eczema presents for concern for spider bites on feet  Bites - Noted bumps on her bilateral feet yesterday morning when she awoke - She denies exposure to animals, outdoors, no other people in the family with similar lesions - She denies ever having a rash and this before - She does have a daughter who is in the fourth grade - Otherwise reports fever 2 days ago when she was in the emergency room for hypertension, but no fever was documented at that time  - The bumps are painful in nature, not itchy - She thinks she notes a small bump on the palmar surface of her right hand - Denies chest pain, shortness of breath, rash anywhere else on her body   Smoking status reviewed  Review of Systems  Per HPI,  headache, changes in vision, chest pain, shortness of breath, abdominal pain, N/V/D, weakness    Objective:  BP 138/78   Pulse 100   Temp 98.3 F (36.8 C) (Oral)   Wt (!) 301 lb (136.5 kg)   SpO2 100%   BMI 50.09 kg/m  Vitals and nursing note reviewed  General: NAD Cardiac: RRR Respiratory: CTAB, normal effort Skin: Blistering lesions on bilateral soles of feet, 1 small erythematous lesion on the palmar surface of her right thenar digit. No other rashes noted Neuro: alert and oriented         Assessment & Plan:    Dyshidrotic eczema History and physical exam consistent with dyshidrotic eczema - will treat with clobetasol x2 weeks, precautions discussed - hydration counseling discussed - will follow in 2 weeks    Amylah Will A. Kennon RoundsHaney MD, MS Family Medicine Resident PGY-3 Pager [phone]

## 2016-02-18 ENCOUNTER — Encounter: Payer: Self-pay | Admitting: Student

## 2016-02-18 ENCOUNTER — Ambulatory Visit (INDEPENDENT_AMBULATORY_CARE_PROVIDER_SITE_OTHER): Payer: Medicaid Other | Admitting: Student

## 2016-02-18 VITALS — BP 152/115 | HR 80 | Temp 99.7°F | Ht 65.0 in | Wt 312.6 lb

## 2016-02-18 DIAGNOSIS — L301 Dyshidrosis [pompholyx]: Secondary | ICD-10-CM

## 2016-02-18 DIAGNOSIS — J45909 Unspecified asthma, uncomplicated: Secondary | ICD-10-CM | POA: Insufficient documentation

## 2016-02-18 DIAGNOSIS — J4520 Mild intermittent asthma, uncomplicated: Secondary | ICD-10-CM | POA: Diagnosis not present

## 2016-02-18 MED ORDER — ALBUTEROL SULFATE HFA 108 (90 BASE) MCG/ACT IN AERS: 2.0000 | INHALATION_SPRAY | Freq: Four times a day (QID) | RESPIRATORY_TRACT | 2 refills | Status: DC | PRN

## 2016-02-18 NOTE — Assessment & Plan Note (Signed)
SOB likely contributed to by obesity as well as component of asthma. No chest pain and age makes cardiac component less likely - will obtain PFTS - albuterol Rx today - will follow with PCP for further evaluation - consider cardiac evaluation if PFTs wnl

## 2016-02-18 NOTE — Progress Notes (Signed)
   Subjective:    Patient ID: Holly [name], female    DOB: 05-01-1980, 35 y.o.   MRN: [id number]   CC: f/u dyshidrotic eczema, concern for asthma  HPI: 35 y/o F preset to follow up dyshidrotic eczema of her feet as well as concern for asthma  Eczema - resolved pain after using clobetasol x2 weeks - continues to have peeling of feet - reports continued hydration practices  Asthma - has worsening SOB when at exercises class or after a long night of dancing - denies chest pain - reports h/o exercise induced asthma but has never taken any medication for this - no shortness of breat at this time    Smoking status reviewed  Review of Systems  Per HPI, else denies recent illness, fever, headache, changes in vision, abdominal pain, N/V/D    Objective:  BP (!) 152/115   Pulse 80   Temp 99.7 F (37.6 C) (Oral)   Ht 5\' 5"  (1.651 m)   Wt (!) 312 lb 9.6 oz (141.8 kg)   SpO2 99%   BMI 52.02 kg/m  Vitals and nursing note reviewed  General: NAD Cardiac: RRR Respiratory: CTAB, normal effort Extremities: no edema or cyanosis.  Skin: warm and dry, no rashes noted Neuro: alert and oriented   Assessment & Plan:    Dyshidrotic eczema Improved with clobetasol - counseled to continue aggressive hydration  Asthma SOB likely contributed to by obesity as well as component of asthma. No chest pain and age makes cardiac component less likely - will obtain PFTS - albuterol Rx today - will follow with PCP for further evaluation - consider cardiac evaluation if PFTs wnl     [place] Kuang A. Kennon RoundsHaney MD, MS Family Medicine Resident PGY-3 Pager [phone]

## 2016-02-18 NOTE — Patient Instructions (Signed)
Follow up with PCP Please make an appointment with Dr Raymondo BandKoval for pulmonary function testing as soon as possible You were prescribed Albuterol. Please use this as needed for shortness of breath If you have questions or concerns, call the office at [phone]

## 2016-02-18 NOTE — Assessment & Plan Note (Addendum)
Improved with clobetasol - counseled to continue aggressive hydration

## 2016-02-21 ENCOUNTER — Encounter: Payer: Self-pay | Admitting: Student

## 2016-02-21 ENCOUNTER — Ambulatory Visit (INDEPENDENT_AMBULATORY_CARE_PROVIDER_SITE_OTHER): Payer: Medicaid Other | Admitting: Student

## 2016-02-21 DIAGNOSIS — I10 Essential (primary) hypertension: Secondary | ICD-10-CM

## 2016-02-21 DIAGNOSIS — Z23 Encounter for immunization: Secondary | ICD-10-CM | POA: Diagnosis not present

## 2016-02-21 DIAGNOSIS — A084 Viral intestinal infection, unspecified: Secondary | ICD-10-CM

## 2016-02-21 DIAGNOSIS — R110 Nausea: Secondary | ICD-10-CM | POA: Diagnosis present

## 2016-02-21 MED ORDER — ONDANSETRON HCL 4 MG PO TABS: 4.0000 mg | ORAL_TABLET | Freq: Three times a day (TID) | ORAL | 0 refills | Status: DC | PRN

## 2016-02-21 NOTE — Progress Notes (Signed)
   Subjective:    Patient ID: Holly [name] is a 35 y.o. old female. Patient is here to discuss about nausea/abdominal pain and diarrhea HPI #Nausea: since this morning. Denies emesis but heaving. Reports abdominal pain. Pain is over epigastric area. Denies radiation to the back. Pain is sharp in nature. Pain is intermittent, every one-two hours. Lasts about 10 minutes. No trigger or alleviating factor. Also reports dizziness that she describes as off balance. Denies fall or vertigo.  She had diarrhea yesterday with urge and runny stools. Denies blood in stool. Denies URI symptoms,  fever, chills, dysuria, new food, partying, new medicine or sick contact. Never had symptoms like this before.   Last sexual intercourse more than 3 years. LMP about 4 months ago. She reports IUD placed in 2014.   PMH: Cholecystectomy in 2015  [facility] No family history of colon cancer, IBD  SH: denies smoking cigarettes, drinking alcohol or using recreational drug  Review of Systems Per HPI Objective:   Vitals:   02/21/16 1457 02/21/16 1459  BP: (!) 153/101 (!) 164/97  Pulse: 77 76  Temp: 98.1 F (36.7 C)   TempSrc: Oral   Weight: (!) 140.3 kg (309 lb 3.2 oz)     GEN: Obese, no apparent distress. Head: normocephalic and atraumatic  Eyes: without conjunctival injection, sclera anicteric Nares: Negative for rhinorrhea, congestion or erythema,  Oropharynx: mmm without erythema or exudation HEM: Negative for cervical lymphadenopathy CVS: RRR, normal s1 and s2, no murmurs, no edema, cap refills < 2 secs RESP: no increased work of breathing, good air movement bilaterally, no crackles or wheeze GI: Obese, bowel sounds present and normal, soft, tender to palpation across her upper quadrants, no guarding, no rebound SKIN: No apparent skin lesion NEURO: alert and oriented appropriately, no gross defecits  PSYCH: appropriate mood and affect      Assessment & Plan:  Viral gastroenteritis History  suggestive for viral gastroenteritis. -Gave prescription for Zofran 4 mg every 8 hours as needed for nausea. EKG without QTc prolongation -Discussed adequate hydration with water/Gatorade -Discussed return precautions and gave handout about viral gastroenteritis  HTN (hypertension) Noted that her blood pressure is elevated. She did not take her blood pressure medicines today due to nausea. However, her blood pressure has been on the higher side for long time. -Recommended follow-up with PCP  Received her flu vaccine today.

## 2016-02-21 NOTE — Patient Instructions (Addendum)
It was great seeing you today! We have addressed the following issues today  1. Nausea/abdominal pain/diarrhea: this is likely a viral infection. I have sent a prescription for Zofran to help with nausea. I recommend good hydration with water or Gatorade. Please let us know if his symptoms won't improve over the next few days or if you have worsening of your symptoms.  2.   Blood pressure: Your goal blood pressure is less than 140/90. Your blood pressure today is 164/97 which is high. I recommend you follow up with your primary care doctor on this.    If we did any lab work today, and the results require attention, either me or my nurse will get in touch with you. If everything is normal, you will get a letter in mail. If you don't hear from us in two weeks, please give us a call. Otherwise, we look forward to seeing you again at your next visit. If you have any questions or concerns before then, please call the clinic at [phone].   Please bring all your medications to every doctors visit   Sign up for My Chart to have easy access to your labs results, and communication with your Primary care physician.     Please check-out at the front desk before leaving the clinic.         Take Care,    Viral Gastroenteritis Viral gastroenteritis is also called stomach flu. This illness is caused by a certain type of germ (virus). It can cause sudden watery poop (diarrhea) and throwing up (vomiting). This can cause you to lose body fluids (dehydration). This illness usually lasts for 3 to 8 days. It usually goes away on its own. HOME CARE   Drink enough fluids to keep your pee (urine) clear or pale yellow. Drink small amounts of fluids often.  Ask your doctor how to replace body fluid losses (rehydration).  Avoid:  Foods high in sugar.  Alcohol.  Bubbly (carbonated) drinks.  Tobacco.  Juice.  Caffeine drinks.  Very hot or cold fluids.  Fatty, greasy foods.  Eating too much at  one time.  Dairy products until 24 to 48 hours after your watery poop stops.  You may eat foods with active cultures (probiotics). They can be found in some yogurts and supplements.  Wash your hands well to avoid spreading the illness.  Only take medicines as told by your doctor. Do not give aspirin to children. Do not take medicines for watery poop (antidiarrheals).  Ask your doctor if you should keep taking your regular medicines.  Keep all doctor visits as told. GET HELP RIGHT AWAY IF:   You cannot keep fluids down.  You do not pee at least once every 6 to 8 hours.  You are short of breath.  You see blood in your poop or throw up. This may look like coffee grounds.  You have belly (abdominal) pain that gets worse or is just in one small spot (localized).  You keep throwing up or having watery poop.  You have a fever.  The patient is a child younger than 3 months, and he or she has a fever.  The patient is a child older than 3 months, and he or she has a fever and problems that do not go away.  The patient is a child older than 3 months, and he or she has a fever and problems that suddenly get worse.  The patient is a baby, and he or she  has no tears when crying. MAKE SURE YOU:   Understand these instructions.  Will watch your condition.  Will get help right away if you are not doing well or get worse.   This information is not intended to replace advice given to you by your health care provider. Make sure you discuss any questions you have with your health care provider.   Document Released: 09/20/2007 Document Revised: 06/26/2011 Document Reviewed: 01/18/2011 Elsevier Interactive Patient Education [employer]2016 Elsevier Inc.

## 2016-02-21 NOTE — Assessment & Plan Note (Signed)
Noted that her blood pressure is elevated. She did not take her blood pressure medicines today due to nausea. However, her blood pressure has been on the higher side for long time. -Recommended follow-up with PCP

## 2016-02-21 NOTE — Assessment & Plan Note (Signed)
History suggestive for viral gastroenteritis. -Gave prescription for Zofran 4 mg every 8 hours as needed for nausea. EKG without QTc prolongation -Discussed adequate hydration with water/Gatorade -Discussed return precautions and gave handout about viral gastroenteritis

## 2016-02-24 ENCOUNTER — Ambulatory Visit: Payer: Medicaid Other | Admitting: Pharmacist

## 2016-02-29 ENCOUNTER — Ambulatory Visit: Payer: Medicaid Other | Admitting: Pharmacist

## 2016-05-17 ENCOUNTER — Encounter: Payer: Self-pay | Admitting: Internal Medicine

## 2016-05-17 ENCOUNTER — Ambulatory Visit (INDEPENDENT_AMBULATORY_CARE_PROVIDER_SITE_OTHER): Payer: Medicaid Other | Admitting: Internal Medicine

## 2016-05-17 DIAGNOSIS — R059 Cough, unspecified: Secondary | ICD-10-CM

## 2016-05-17 DIAGNOSIS — R05 Cough: Secondary | ICD-10-CM

## 2016-05-17 DIAGNOSIS — R110 Nausea: Secondary | ICD-10-CM | POA: Insufficient documentation

## 2016-05-17 MED ORDER — ONDANSETRON HCL 4 MG PO TABS: 4.0000 mg | ORAL_TABLET | Freq: Three times a day (TID) | ORAL | 0 refills | Status: DC | PRN

## 2016-05-17 MED ORDER — BENZONATATE 200 MG PO CAPS: 200.0000 mg | ORAL_CAPSULE | Freq: Three times a day (TID) | ORAL | 0 refills | Status: DC | PRN

## 2016-05-17 MED ORDER — FLUTICASONE PROPIONATE 50 MCG/ACT NA SUSP: 2.0000 | Freq: Every day | NASAL | 6 refills | Status: AC

## 2016-05-17 NOTE — Progress Notes (Signed)
   Subjective:    Holly [name] - 36 y.o. female MRN [id number]  Date of birth: [birth year]  HPI  Holly [name] is here for SDA for illness.  Reports one week of cough, nasal congestion and facial pressure. Cough is dry and she denies SOB. Recently started having nausea without emesis and three episodes of non-bloody loose stools without associated abdominal pain over the past 48 hours. Cough and congestion have been improving. Has been eating and drinking normally. Denies fevers. Son and daughter have been sick with colds. She has not tried to take anything for her symptoms.   -  reports that she has never smoked. She has never used smokeless tobacco. - Review of Systems: Per HPI. - Past Medical History: Patient Active Problem List   Diagnosis Date Noted  . Nausea without vomiting 05/17/2016  . Viral gastroenteritis 02/21/2016  . Asthma 02/18/2016  . 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  . Cough 02/23/2014  . Back pain 09/30/2012  . Annual physical exam 09/30/2012  . HTN (hypertension) 09/30/2012  . S/P cholecystectomy 08/29/2012  . Obesity 04/15/2012   - Medications: reviewed and updated   Objective:   Physical Exam BP (!) 150/102   Pulse 85   Temp 98.8 F (37.1 C) (Oral)   Wt (!) 309 lb (140.2 kg)   LMP 05/03/2016   BMI 51.42 kg/m  Gen: NAD, alert, cooperative with exam, well-appearing HEENT: NCAT, PERRL, clear conjunctiva, oropharynx erythematous without exudates, TM normal bilaterally, no TTP over maxillary or frontal sinuses, MMM  CV: RRR, good S1/S2, no murmur, no edema, capillary refill brisk  Resp: CTABL, no wheezes, non-labored Abd: SNTND, BS present, no guarding or organomegaly  Assessment & Plan:   Cough Suspect viral URI with postnasal drip. Time course not consistent with a bacterial sinusitis. Lungs clear and patient not hypoxic. -Rx for Tessalon given  -Flonase for nasal congestion and pressure  -OTC medications discussed for supportive care  -return  precautions discussed   Nausea without vomiting With associated loose stools. Suspect viral etiology. Patient appears well hydrated on exam and has been tolerating PO intake. Abdominal exam benign.  -Rx for Zofran given  -return precautions discussed     Marcy Sirenatherine Bernard Slayden, D.O. 05/17/2016, 2:11 PM PGY-2, [facility]

## 2016-05-17 NOTE — Patient Instructions (Signed)
It looks like you've got a nasty virus causing your symptoms. I have prescribed Tessalon to help with your cough and Zofran for your nausea. You can take over the counter medications like Mucinex and Benadryl to help with your congestion.   Please call us if you start having trouble keeping liquids down, if your cough does not improve over the next 1-2 weeks, or if you diarrhea continues for more than a few days.

## 2016-05-17 NOTE — Assessment & Plan Note (Signed)
Suspect viral URI with postnasal drip. Time course not consistent with a bacterial sinusitis. Lungs clear and patient not hypoxic. -Rx for Tessalon given  -Flonase for nasal congestion and pressure  -OTC medications discussed for supportive care  -return precautions discussed

## 2016-05-17 NOTE — Assessment & Plan Note (Signed)
With associated loose stools. Suspect viral etiology. Patient appears well hydrated on exam and has been tolerating PO intake. Abdominal exam benign.  -Rx for Zofran given  -return precautions discussed

## 2016-09-19 ENCOUNTER — Ambulatory Visit (INDEPENDENT_AMBULATORY_CARE_PROVIDER_SITE_OTHER): Payer: Medicaid Other | Admitting: Internal Medicine

## 2016-09-19 ENCOUNTER — Encounter: Payer: Self-pay | Admitting: Internal Medicine

## 2016-09-19 VITALS — BP 152/92 | HR 71 | Temp 98.2°F | Ht 65.0 in | Wt 314.8 lb

## 2016-09-19 DIAGNOSIS — J029 Acute pharyngitis, unspecified: Secondary | ICD-10-CM | POA: Diagnosis not present

## 2016-09-19 DIAGNOSIS — I10 Essential (primary) hypertension: Secondary | ICD-10-CM

## 2016-09-19 DIAGNOSIS — J069 Acute upper respiratory infection, unspecified: Secondary | ICD-10-CM | POA: Diagnosis not present

## 2016-09-19 LAB — POCT RAPID STREP A (OFFICE): RAPID STREP A SCREEN: NEGATIVE

## 2016-09-19 NOTE — Patient Instructions (Addendum)
Please monitor your BP at home and call if numbers are >150/90 at home. Do not take medications containing Pseudoephedrine. This medication is not safe with your history of high blood pressure.   I recommend chloraseptic spray, lozenges, and warm beverages for your sore throat. You can continue taking the sinus medication you have been taking. You might also find that an antihistamine such as Benadryl, Claritin, Allegra, etc may be helpful as it helps to dry out congestion.   Please return if symptoms persist longer than 2 weeks or if you improve and then suddenly worsen. Return if you have fevers or shortness of breath.

## 2016-09-19 NOTE — Progress Notes (Signed)
   Subjective:    Holly [name] - 36 y.o. female MRN [id number]  Date of birth: 04/07/1981  HPI  Holly [name] is here for SDA for nasal congestion. Has been present for about 24h. Experiencing sore throat as well that makes swallowing painful. Sensation of congestion dripping down the back of her throat. Has had some chills at home but has been afebrile. Denies cough. Denies vomiting and diarrhea. Daughter was sick last week with similar symptoms and mom reports that there was concern that daughter had strep throat. Has been taking Phenylphrine and Advil. Did not take her BP medication today as she was concerned that she should not take it with her sinus medication. BP noted to be elevated at OV today. Patient denies severe headache, vision changes, and chest pain. Has a BP cuff at home and is agreeable to home monitoring.    -  reports that she has never smoked. She has never used smokeless tobacco. - Review of Systems: Per HPI. - Past Medical History: Patient Active Problem List   Diagnosis Date Noted  . Nausea without vomiting 05/17/2016  . Viral gastroenteritis 02/21/2016  . Asthma 02/18/2016  . 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  . Cough 02/23/2014  . Back pain 09/30/2012  . Annual physical exam 09/30/2012  . HTN (hypertension) 09/30/2012  . S/P cholecystectomy 08/29/2012  . Obesity 04/15/2012   - Medications: reviewed and updated   Objective:   Physical Exam BP (!) 152/92   Pulse 71   Temp 98.2 F (36.8 C) (Oral)   Ht 5\' 5"  (1.651 m)   Wt (!) 314 lb 12.8 oz (142.8 kg)   SpO2 98%   BMI 52.39 kg/m  Gen: NAD, alert, cooperative with exam, well-appearing HEENT: NCAT, PERRL, clear conjunctiva, oropharynx erythematous, tonsils enlarged without exudates, TMs normal bilaterally, turbinates slightly enlarged and inflammed appearing, shotty tender anterior cervical LAD  CV: RRR, good S1/S2, no murmur, no edema, capillary refill brisk  Resp: CTABL, no wheezes, non-labored      Assessment & Plan:   1. Viral URI Suspect viral URI as cause of patient's symptoms. Rapid strep negative. Obtained due to ?strep contact as well as absence of cough with tender LAD in the setting of sore throat. Sore throat likely related to postnasal drip from significant nasal congestion. Discussed that Phenylphrine should be safe despite her history of HTN. Advised against products containing pseudoephedrine. Recommended addition of an antihistamine to dry out secretions. Also recommended supportive care for her sore throat. Discussed that antibiotics at this point are not warranted. Return if symptoms persist for >2 weeks or if she were to improve and then suddenly worsen again as this may represent a secondary sickening.   2. Essential hypertension Elevated BP today likely due to lack of medication today as well as illness. Have recommended patient take her HCTZ and Amlodipine when she gets home today. She is to monitor her BP at home and follow up with PCP if BP remains >150/90 or if she develops red flag symptoms.    Marcy Sirenatherine Karam Dunson, D.O. 09/19/2016, 4:33 PM PGY-2, [facility]

## 2016-11-30 ENCOUNTER — Ambulatory Visit (INDEPENDENT_AMBULATORY_CARE_PROVIDER_SITE_OTHER): Payer: Medicaid Other | Admitting: Internal Medicine

## 2016-11-30 ENCOUNTER — Ambulatory Visit
Admission: RE | Admit: 2016-11-30 | Discharge: 2016-11-30 | Disposition: A | Discharge status: Home / Self Care | Payer: Medicaid Other | Source: Ambulatory Visit | Attending: Family Medicine | Admitting: Family Medicine

## 2016-11-30 ENCOUNTER — Telehealth: Payer: Self-pay | Admitting: *Deleted

## 2016-11-30 VITALS — BP 168/100 | HR 77 | Temp 98.2°F | Wt 319.0 lb

## 2016-11-30 DIAGNOSIS — M25572 Pain in left ankle and joints of left foot: Secondary | ICD-10-CM

## 2016-11-30 NOTE — Telephone Encounter (Signed)
Pt would like to know the results of her xray today. Holly [name], Maryjo RochesterJessica Dawn, CMA

## 2016-11-30 NOTE — Telephone Encounter (Signed)
Called patient back and let her know her x-ray was negative. She is on her way to Sports Medicine to get fitted for a boot.

## 2016-11-30 NOTE — Patient Instructions (Addendum)
I'm so sorry that you twisted your ankle!  I think this is most likely a sprain, but I would like to get an x-ray to make sure you have not broken your ankle. I will call you with these results.  Please go to the Sports Medicine Office at 4pm today to be fitted for a boot. [address] address is [address] They are on the bottom floor.  You should use Ibuprofen and Tylenol as needed for pain. You should also ice your ankle and keep it elevated as much as possible.  -Dr. Nancy MarusMayo

## 2016-11-30 NOTE — Progress Notes (Signed)
   [facility] Phone: [phone]  Subjective:  Holly [name] is a 36 year old female presenting to clinic for left ankle pain. This morning, she walked her child to the bus stop. When she turned to head back home, "her body moved in one direction, but her foot stayed planted". This caused her foot to twist to the outside. She had difficulty bearing weight when it happened and had to limp back home. The pain is mostly on the outside of her ankle and the top of her foot. The pain is worse with walking or going up stairs. The pain has been better with elevation. She notes that the ankle swelled up right away. She has not tried taking any medications.  ROS: See HPI for pertinent positives and negatives  Past Medical History- HTN, asthma, obesity  Family history reviewed for today's visit. No changes.  Social history- patient is a never smoker  Objective: BP (!) 168/100   Pulse 77   Temp 98.2 F (36.8 C) (Oral)   Wt (!) 319 lb (144.7 kg)   BMI 53.08 kg/m  Gen: NAD, alert, cooperative with exam, sitting in wheelchair Msk: L ankle edematous, no ecchymosis, no gross deformities; +tenderness to palpation along the posterior edge of the lateral malleolus, +tenderness over the anterior and posterior talofibular ligament, +pain with inversion of the ankle but otherwise normal ROM, unable to bear weight secondary to pain. Feet are warm and well-perfused bilaterally. Neuro: Sensation intact to light touch.  Assessment/Plan: Left ankle pain: Think this is likely an ankle sprain, given her mechanism and her ability to bear weight after she twisted her ankle, even though she had significant pain with ambulation. She is unable to bear weight in clinic today (likely due to pain) and she does have tenderness along the posterior edge of the lateral malleolus, so she warrants ankle imaging per [place] ankle rules. - Left ankle x-ray ordered to rule out fracture - Patient will go down to  sports med clinic after x-ray to be fitted for a boot - Advised that patient use Tylenol and Ibuprofen prn for pain - Recommended that patient rest the ankle for 3 days and then start to do gentle ROM exercises - Follow-up in our clinic in 3-4 weeks so that we can make sure she is getting better. She can likely start full home ankle rehabilitation exercises if her pain has improved.   Holly CarolKaty Niaya Hickok, MD PGY-3

## 2016-12-01 DIAGNOSIS — M25572 Pain in left ankle and joints of left foot: Secondary | ICD-10-CM | POA: Insufficient documentation

## 2016-12-01 NOTE — Assessment & Plan Note (Signed)
Think this is likely an ankle sprain, given her mechanism and her ability to bear weight after she twisted her ankle, even though she had significant pain with ambulation. She is unable to bear weight in clinic today (likely due to pain) and she does have tenderness along the posterior edge of the lateral malleolus, so she warrants ankle imaging per [place] ankle rules. - Left ankle x-ray ordered to rule out fracture - Patient will go down to sports med clinic after x-ray to be fitted for a boot - Advised that patient use Tylenol and Ibuprofen prn for pain - Recommended that patient rest the ankle for 3 days and then start to do gentle ROM exercises - Follow-up in our clinic in 3-4 weeks so that we can make sure she is getting better. She can likely start full home ankle rehabilitation exercises at that time if her pain has improved.

## 2016-12-19 ENCOUNTER — Emergency Department (HOSPITAL_COMMUNITY): Payer: Medicaid Other

## 2016-12-19 ENCOUNTER — Emergency Department (HOSPITAL_COMMUNITY)
Admission: EM | Admit: 2016-12-19 | Discharge: 2016-12-19 | Disposition: A | Discharge status: Home / Self Care | Payer: Medicaid Other | Attending: Emergency Medicine | Admitting: Emergency Medicine

## 2016-12-19 ENCOUNTER — Encounter (HOSPITAL_COMMUNITY): Payer: Self-pay | Admitting: Emergency Medicine

## 2016-12-19 DIAGNOSIS — J45909 Unspecified asthma, uncomplicated: Secondary | ICD-10-CM | POA: Diagnosis not present

## 2016-12-19 DIAGNOSIS — H538 Other visual disturbances: Secondary | ICD-10-CM | POA: Diagnosis present

## 2016-12-19 DIAGNOSIS — Z79899 Other long term (current) drug therapy: Secondary | ICD-10-CM | POA: Insufficient documentation

## 2016-12-19 DIAGNOSIS — R51 Headache: Secondary | ICD-10-CM | POA: Diagnosis not present

## 2016-12-19 DIAGNOSIS — I10 Essential (primary) hypertension: Secondary | ICD-10-CM

## 2016-12-19 LAB — I-STAT CHEM 8, ED
BUN: 5 mg/dL — AB (ref 6–20)
Calcium, Ion: 1.07 mmol/L — ABNORMAL LOW (ref 1.15–1.40)
Chloride: 104 mmol/L (ref 101–111)
Creatinine, Ser: 0.6 mg/dL (ref 0.44–1.00)
Glucose, Bld: 100 mg/dL — ABNORMAL HIGH (ref 65–99)
HEMATOCRIT: 43 % (ref 36.0–46.0)
Hemoglobin: 14.6 g/dL (ref 12.0–15.0)
Potassium: 3.8 mmol/L (ref 3.5–5.1)
SODIUM: 140 mmol/L (ref 135–145)
TCO2: 26 mmol/L (ref 22–32)

## 2016-12-19 MED ORDER — AMLODIPINE BESYLATE 5 MG PO TABS
5.0000 mg | ORAL_TABLET | Freq: Every day | ORAL | Status: DC
  Administered 2016-12-19: 5 mg via ORAL
  Filled 2016-12-19: qty 1

## 2016-12-19 MED ORDER — HYDROCODONE-ACETAMINOPHEN 5-325 MG PO TABS
1.0000 | ORAL_TABLET | Freq: Once | ORAL | Status: AC
  Administered 2016-12-19: 1 via ORAL
  Filled 2016-12-19: qty 1

## 2016-12-19 MED ORDER — DIPHENHYDRAMINE HCL 50 MG/ML IJ SOLN
25.0000 mg | Freq: Once | INTRAMUSCULAR | Status: AC
  Administered 2016-12-19: 25 mg via INTRAVENOUS
  Filled 2016-12-19: qty 1

## 2016-12-19 MED ORDER — FLUORESCEIN SODIUM 0.6 MG OP STRP
1.0000 | ORAL_STRIP | Freq: Once | OPHTHALMIC | Status: AC
  Administered 2016-12-19: 1 via OPHTHALMIC
  Filled 2016-12-19: qty 1

## 2016-12-19 MED ORDER — DEXAMETHASONE SODIUM PHOSPHATE 10 MG/ML IJ SOLN
10.0000 mg | Freq: Once | INTRAMUSCULAR | Status: AC
  Administered 2016-12-19: 10 mg via INTRAVENOUS
  Filled 2016-12-19: qty 1

## 2016-12-19 MED ORDER — HYDROCHLOROTHIAZIDE 12.5 MG PO CAPS
25.0000 mg | ORAL_CAPSULE | Freq: Once | ORAL | Status: AC
  Administered 2016-12-19: 25 mg via ORAL
  Filled 2016-12-19: qty 2

## 2016-12-19 MED ORDER — TETRACAINE HCL 0.5 % OP SOLN
1.0000 [drp] | Freq: Once | OPHTHALMIC | Status: AC
  Administered 2016-12-19: 1 [drp] via OPHTHALMIC
  Filled 2016-12-19: qty 4

## 2016-12-19 MED ORDER — METOCLOPRAMIDE HCL 5 MG/ML IJ SOLN
10.0000 mg | Freq: Once | INTRAMUSCULAR | Status: AC
  Administered 2016-12-19: 10 mg via INTRAVENOUS
  Filled 2016-12-19: qty 2

## 2016-12-19 MED ORDER — SODIUM CHLORIDE 0.9 % IV SOLN
INTRAVENOUS | Status: DC
  Administered 2016-12-19: 19:00:00 via INTRAVENOUS

## 2016-12-19 NOTE — Discharge Instructions (Signed)
I spoke with Dr. Charlotte SanesMcCuen and someone in her office will see you in the morning at 8:30am for further evaluation. Be sure your are drinking plenty of fluids and take your blood pressure medication as directed. Take tylenol for your headache.

## 2016-12-19 NOTE — ED Triage Notes (Signed)
Pt states she went swimming yesterday and then developed pain in bilateral eyes and burning last night. Pt states blurred vision at times. Pt states pain is worse when eyes are closed.

## 2016-12-19 NOTE — ED Provider Notes (Signed)
[facility]-EMERGENCY DEPT Provider Note   CSN: [id number] Arrival date & time: 12/19/16  1151     History   Chief Complaint Chief Complaint  Patient presents with  . Eye Pain  . Hypertension    HPI Holly [name] is a 36 y.o. female with hx of HTN and morbid obestiy presents to the ED with headache and blurred vision. She reports that the symptoms of blurry vision started yesterday. Patient thought that it was due to swimming because it has happened before. Later the symptoms cleared and patient had no further problem until she got to work today and started trying to read the computer screen. Patient reports that bright lights make the headache worse. Patient states that her vision was blurry and she had a headache. She finally told them she needed to leave. Patient reports continued blurry vision and headache.   On arrival to the ED BP 175/122. Patient has not taken her BP medication today.  HPI  Past Medical History:  Diagnosis Date  . Eczema    Hx: of  . Pregnancy   . Pregnancy induced hypertension   . Seasonal allergies    Hx: of    Patient Active Problem List   Diagnosis Date Noted  . Acute left ankle pain 12/01/2016  . Nausea without vomiting 05/17/2016  . Viral gastroenteritis 02/21/2016  . Asthma 02/18/2016  . 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  . Cough 02/23/2014  . Back pain 09/30/2012  . Annual physical exam 09/30/2012  . HTN (hypertension) 09/30/2012  . S/P cholecystectomy 08/29/2012  . Obesity 04/15/2012    Past Surgical History:  Procedure Laterality Date  . c-sectionx1    . CESAREAN SECTION    . CHOLECYSTECTOMY N/A 08/30/2012   Procedure: LAPAROSCOPIC CHOLECYSTECTOMY WITH INTRAOPERATIVE CHOLANGIOGRAM;  Surgeon: Shelly Rubensteinouglas A Blackman, MD;  Location: [facility] OR;  Service: General;  Laterality: N/A;  . NO PAST SURGERIES    . UPPER GI ENDOSCOPY     Hx: of    OB History    Gravida Para Term Preterm AB Living   3 2 2   1 2    SAB TAB Ectopic Multiple Live Births   1        2       Home Medications    Prior to Admission medications   Medication Sig Start Date End Date Taking? Authorizing Provider  albuterol (PROVENTIL HFA;VENTOLIN HFA) 108 (90 Base) MCG/ACT inhaler Inhale 2 puffs into the lungs every 6 (six) hours as needed for wheezing or shortness of breath. 02/18/16   Haney, Arlyss RepressAlyssa A, MD  amLODipine (NORVASC) 5 MG tablet Take 1 tablet (5 mg total) by mouth daily. 02/01/16 03/02/16  Nira Connardama, Pedro Eduardo, MD  aspirin 325 MG EC tablet Take 325 mg by mouth every 6 (six) hours as needed for pain.    [provider]  benzonatate (TESSALON) 200 MG capsule Take 1 capsule (200 mg total) by mouth 3 (three) times daily as needed for cough. 05/17/16   Arvilla MarketWallace, Catherine Lauren, DO  fluticasone (FLONASE) 50 MCG/ACT nasal spray Place 2 sprays into both nostrils daily. 05/17/16   Arvilla MarketWallace, Catherine Lauren, DO  hydrochlorothiazide (HYDRODIURIL) 25 MG tablet Take 1 tablet (25 mg total) by mouth daily. 02/01/16 03/02/16  Nira Connardama, Pedro Eduardo, MD  ondansetron (ZOFRAN) 4 MG tablet Take 1 tablet (4 mg total) by mouth every 8 (eight) hours as needed for nausea or vomiting. 05/17/16   Arvilla MarketWallace, Catherine Lauren, DO    Family History Family History  Problem Relation  Age of Onset  . Hypertension Father   . Hyperlipidemia Father   . Lung cancer Paternal Grandmother   . Diabetes Maternal Grandfather   . Kidney disease Paternal Uncle   . Colon cancer Neg Hx   . Esophageal cancer Neg Hx   . Stomach cancer Neg Hx     Social History Social History  Substance Use Topics  . Smoking status: Never Smoker  . Smokeless tobacco: Never Used  . Alcohol use No     Allergies   Naproxen sodium   Review of Systems Review of Systems  Constitutional: Negative for chills and fever.  HENT: Negative.   Eyes: Positive for visual disturbance.  Respiratory: Negative for chest tightness.   Cardiovascular: Negative for chest pain.  Gastrointestinal: Negative for abdominal  pain, nausea and vomiting.  Musculoskeletal: Negative for gait problem.  Skin: Negative for rash.  Neurological: Positive for headaches.  Psychiatric/Behavioral: Negative for confusion.     Physical Exam Updated Vital Signs BP (!) 161/104 (BP Location: Left Arm)   Pulse 73   Temp 98.5 F (36.9 C) (Oral)   Resp 20   LMP 11/28/2016   SpO2 99%   Physical Exam  Constitutional: She is oriented to person, place, and time. No distress.  obese  HENT:  Head: Normocephalic and atraumatic.  Eyes: Pupils are equal, round, and reactive to light. Conjunctivae are normal. Right eye exhibits nystagmus.  Eye pressure right 31, left 20 Visual acuity R 20/70, L 20/30  Neck: Normal range of motion. Neck supple.  Cardiovascular: Normal rate and regular rhythm.   Pulmonary/Chest: Effort normal and breath sounds normal.  Abdominal: Soft. There is no tenderness.  Musculoskeletal: Normal range of motion.  Neurological: She is alert and oriented to person, place, and time. No cranial nerve deficit.  Skin: Skin is warm and dry.  Psychiatric: She has a normal mood and affect.  Nursing note and vitals reviewed.    ED Treatments / Results  Labs (all labs ordered are listed, but only abnormal results are displayed) Labs Reviewed  I-STAT CHEM 8, ED - Abnormal; Notable for the following:       Result Value   BUN 5 (*)    Glucose, Bld 100 (*)    Calcium, Ion 1.07 (*)    All other components within normal limits   4:00pm Dr. Madilyn Hook in to see the patient.  Consult with Dr. Charlotte Sanes on call for opthalmology, and she request patient come to the office in the morning at 8 am. For further evaluation.   Radiology Ct Head Wo Contrast  Result Date: 12/19/2016 CLINICAL DATA:  Pt states she went swimming yesterday and then developed pain in bilateral eyes and burning last night. Pt states blurred vision at times. Pt states pain is worse when eyes are closed. EXAM: CT HEAD WITHOUT CONTRAST TECHNIQUE: Contiguous  axial images were obtained from the base of the skull through the vertex without intravenous contrast. COMPARISON:  Head CT 01/29/2011 FINDINGS: Brain: No acute intracranial hemorrhage. No focal mass lesion. No CT evidence of acute infarction. No midline shift or mass effect. No hydrocephalus. Basilar cisterns are patent. Vascular: No hyperdense vessel or unexpected calcification. Skull: Normal. Negative for fracture or focal lesion. Sinuses/Orbits: Paranasal sinuses and mastoid air cells are clear. Orbits are clear. Globes are normal. Other: None. IMPRESSION: Normal head CT. Electronically Signed   By: Genevive Bi M.D.   On: 12/19/2016 15:05    Procedures Patient given headache cocktail for her headache and  the headache resolved.   Procedures (including critical care time)  Medications Ordered in ED Medications  amLODipine (NORVASC) tablet 5 mg (5 mg Oral Given 12/19/16 1502)  HYDROcodone-acetaminophen (NORCO/VICODIN) 5-325 MG per tablet 1 tablet (1 tablet Oral Given 12/19/16 1436)  hydrochlorothiazide (MICROZIDE) capsule 25 mg (25 mg Oral Given 12/19/16 1502)  tetracaine (PONTOCAINE) 0.5 % ophthalmic solution 1 drop (1 drop Both Eyes Given by Other 12/19/16 1719)  fluorescein ophthalmic strip 1 strip (1 strip Both Eyes Given by Other 12/19/16 1720)   36 y.o. female with blurry vision and elevated occular pressure in the right eye stable for d/c to f/u with Dr. Dalbert Mayotte office on 12/20/16 @ 8:00 am.  Initial Impression / Assessment and Plan / ED Course  I have reviewed the triage vital signs and the nursing notes.   Final Clinical Impressions(s) / ED Diagnoses   Final diagnoses:  Hypertension, unspecified type  Blurry vision, bilateral    New Prescriptions New Prescriptions   No medications on file     Janne Napoleon, NP 12/20/16 1356    Tilden Fossa, MD 12/23/16 1009

## 2016-12-20 ENCOUNTER — Encounter: Payer: Self-pay | Admitting: Family Medicine

## 2016-12-20 ENCOUNTER — Ambulatory Visit (INDEPENDENT_AMBULATORY_CARE_PROVIDER_SITE_OTHER): Payer: Medicaid Other | Admitting: Family Medicine

## 2016-12-20 DIAGNOSIS — Z23 Encounter for immunization: Secondary | ICD-10-CM

## 2016-12-20 DIAGNOSIS — I10 Essential (primary) hypertension: Secondary | ICD-10-CM

## 2016-12-20 DIAGNOSIS — M25572 Pain in left ankle and joints of left foot: Secondary | ICD-10-CM | POA: Diagnosis not present

## 2016-12-20 MED ORDER — AMLODIPINE BESYLATE 5 MG PO TABS: 5.0000 mg | ORAL_TABLET | Freq: Every day | ORAL | 0 refills | Status: DC

## 2016-12-20 MED ORDER — HYDROCHLOROTHIAZIDE 25 MG PO TABS
25.0000 mg | ORAL_TABLET | Freq: Every day | ORAL | 0 refills | Status: DC
Start: 2016-12-20 — End: 2017-01-15

## 2016-12-20 MED ORDER — HYDROCHLOROTHIAZIDE 25 MG PO TABS
25.0000 mg | ORAL_TABLET | Freq: Every day | ORAL | 0 refills | Status: DC
Start: 2016-12-20 — End: 2016-12-20

## 2016-12-20 NOTE — Assessment & Plan Note (Signed)
Ran out of BP meds. Today completely asymptomatic despite initial BP 180/100. BMP yesterday in ED with normal Cr and K. Will restart both norvasc 5mg  and HCTZ 25mg . Asked patient to return for nurse visit in 1 week to recheck BP, anticipate that she may need 10mg  Norvasc. F/u with PCP in 1 month to meet PCP, recheck HTN, and PAP.

## 2016-12-20 NOTE — Progress Notes (Signed)
   CC: HTN, ankle pain  HPI Blurry vision, resolved - this is what brought her to the ED yesterday. Optho eval this morning per patient reported normal vision, irritation in R eye, maybe from chlorine, also may be irritated by HTN.   HTN - previously on norvasc and HCTZ. No meds today. No HA today. No CP, SOB. Has changed her diet in the past month and feels this helped her BP, but "ate badly" this weekend. Ran out of BP meds for a couple of days. Occasionally checked her BP at home prior with dad's cuff. Reports that norvasc and HCTZ previously worked well for her. Notices very rare foot swelling with dietary indiscretions.  Ankle pain - hx of ankle sprain without evidence of fracture. Seen here for this on 8/16. Negative imaging for fracture. Yes[place] she [place] and noticed some pain afternoon (after having boot off for about 12 hours). Has been doing foot exercises.   ROS: denies CP, SOB, HA, abd pain, dysuria, changes in BMs.    CC, SH/smoking status, and VS noted  Objective: BP (!) 166/108   Pulse (!) 102   Temp 98.4 F (36.9 C) (Oral)   Ht 5\' 5"  (1.651 m)   Wt (!) 311 lb 12.8 oz (141.4 kg)   LMP 11/28/2016   SpO2 98%   BMI 51.89 kg/m  Gen: NAD, alert, cooperative, and pleasant morbidly obese female.  HEENT: NCAT, EOMI, PERRL CV: RRR, no murmur Resp: CTAB, no wheezes, non-labored Ext: No edema, warm.  Neuro: Alert and oriented, Speech clear, No gross deficits  Assessment and plan:  HTN (hypertension) Ran out of BP meds. Today completely asymptomatic despite initial BP 180/100. BMP yesterday in ED with normal Cr and K. Will restart both norvasc 5mg  and HCTZ 25mg . Asked patient to return for nurse visit in 1 week to recheck BP, anticipate that she may need 10mg  Norvasc. F/u with PCP in 1 month to meet PCP, recheck HTN, and PAP.   Acute left ankle pain Follow up from previous. No new injury. Arrived in CAM boot, normal ROM on exam. Given pain  without CAM boot for 12 hrs, recommended weaning CAM boot to 50% of the day x 1 week, then 25% of day x 1 week etc. RTC if not improving. Continue home rehab exercises.   No orders of the defined types were placed in this encounter.   Meds ordered this encounter  Medications  . DISCONTD: hydrochlorothiazide (HYDRODIURIL) 25 MG tablet    Sig: Take 1 tablet (25 mg total) by mouth daily.    Dispense:  30 tablet    Refill:  0  . DISCONTD: amLODipine (NORVASC) 5 MG tablet    Sig: Take 1 tablet (5 mg total) by mouth daily.    Dispense:  30 tablet    Refill:  0  . amLODipine (NORVASC) 5 MG tablet    Sig: Take 1 tablet (5 mg total) by mouth daily.    Dispense:  30 tablet    Refill:  0  . hydrochlorothiazide (HYDRODIURIL) 25 MG tablet    Sig: Take 1 tablet (25 mg total) by mouth daily.    Dispense:  30 tablet    Refill:  0    Health Maintenance reviewed - patient asked to schedule her pap smear, given flu shot.  Loni MuseKate Janmarie Smoot, MD, PGY2 12/20/2016 3:44 PM

## 2016-12-20 NOTE — Assessment & Plan Note (Signed)
Follow up from previous. No new injury. Arrived in CAM boot, normal ROM on exam. Given pain without CAM boot for 12 hrs, recommended weaning CAM boot to 50% of the day x 1 week, then 25% of day x 1 week etc. RTC if not improving. Continue home rehab exercises.

## 2016-12-20 NOTE — Patient Instructions (Addendum)
It was a pleasure to see you today! Thank you for choosing [facility] for your primary care. Holly [name] was seen for HTN, ankle pain.   Our plans for today were:  Restart BOTH of your blood pressure medicines.   Come back in 1 week to have your blood pressure check in a NURSE VISIT.   Please schedule your PAP with Dr. Darin Engels as soon as you can.   For your ankle, reduce your boot to 50% of the time during the day. Continue ankle exercises as below.    You should return to our clinic to see Dr. Darin Engels  in 1 month  for BP and PAP smear.   Best,  Dr. Chanetta Marshall   Stretching and range of motion exercises These exercises warm up your muscles and joints and improve the movement and flexibility in your ankle and foot. These exercises may also help to relieve pain. Exercise A: Standing wall calf stretch, knee straight  1. Stand with your hands against a wall. 2. Extend your __________ leg behind you, and bend your front knee slightly. Keep both of your heels on the floor. 3. Point the toes of your back foot slightly inward. 4. Keeping your heels on the floor and your back knee straight, shift your weight toward the wall. Do not allow your back to arch. You should feel a gentle stretch in the back of your lower leg (calf). 5. Hold this position for __________ seconds. Repeat __________ times. Complete this stretch __________ times a day. Exercise B: Standing wall calf stretch, knee bent 1. Stand with your hands against a wall. 2. Extend your __________ leg behind you, and bend your front knee slightly. Keep both of your heels on the floor. 3. Point the toes of your back foot slightly inward. 4. Unlock your back knee so it is bent. Keep your heels on the floor. You should feel a gentle stretch deep in your calf. 5. Hold this position for __________ seconds. Repeat __________ times. Complete this exercise __________ times a day. Strengthening exercises These exercises build  strength and endurance in your ankle and foot. Endurance is the ability to use your muscles for a long time, even after they get tired. Exercise D: Towel curls  1. Sit in a chair on a non-carpeted surface, and put your feet on the floor. 2. Place a towel in front of your feet. If told by your health care provider, add __________ at the end of the towel. 3. Keeping your heel on the floor, put your __________ foot on the towel. 4. Pull the towel toward you by grabbing the towel with your toes and curling them under. Keep your heel on the floor. 5. Let your toes relax. 6. Grab the towel with your toes again. Keep going until the towel is completely underneath your foot. Repeat __________ times. Complete this exercise __________ times a day. Balance exercises These exercises improve or maintain your balance. Balance is important in preventing falls. Exercise E: Single leg stand 1. Without shoes, stand near a railing or in a doorway. You can hold on to the railing or door frame as needed for balance. 2. Stand on your __________ foot. Keep your big toe down on the floor and try to keep your arch lifted. If balancing in this position is too easy, try the exercise with your eyes closed or while standing on a pillow. 3. Hold this position for __________ seconds. Repeat __________ times. Complete this exercise __________ times  a day. This information is not intended to replace advice given to you by your health care provider. Make sure you discuss any questions you have with your health care provider. Document Released: 04/03/2005 Document Revised: 12/07/2015 Document Reviewed: 12/18/2014 Elsevier Interactive Patient Education  [employer]2018 [employer]

## 2016-12-27 ENCOUNTER — Ambulatory Visit (INDEPENDENT_AMBULATORY_CARE_PROVIDER_SITE_OTHER): Payer: Medicaid Other | Admitting: *Deleted

## 2016-12-27 VITALS — BP 144/88 | HR 91

## 2016-12-27 DIAGNOSIS — I10 Essential (primary) hypertension: Secondary | ICD-10-CM

## 2016-12-27 DIAGNOSIS — Z0130 Encounter for examination of blood pressure without abnormal findings: Secondary | ICD-10-CM

## 2016-12-27 NOTE — Progress Notes (Signed)
   Patient in nurse clinic for blood pressure check. Pt denies any symptoms today. Pt reported taking her blood pressure medications as prescribed. Will forward to PCP.  Clovis PuMartin, Tamika L, RN   Today's Vitals   12/27/16 1539 12/27/16 1550  BP: (!) 150/98 (!) 144/88  Pulse: 91   SpO2: 94%   PainSc: 0-No pain

## 2017-01-15 ENCOUNTER — Other Ambulatory Visit: Payer: Self-pay | Admitting: Family Medicine

## 2017-01-15 DIAGNOSIS — I10 Essential (primary) hypertension: Secondary | ICD-10-CM

## 2017-01-24 ENCOUNTER — Ambulatory Visit: Payer: Medicaid Other | Admitting: Family Medicine

## 2017-01-24 NOTE — Progress Notes (Deleted)
   Subjective:    Patient ID: Sharman Cheek, female    DOB: 11-15-1980, 36 y.o.   MRN: [id number]   CC: Pap Smear and HTN follow up  HPI: Pap smear Visit for pap smear/ well woman GYN.  Patient   ***   having periods They are   *** She   ***  Currently sexually active with men Contraceptive needs: *** Pelvic issues such as pain, discharge, abnormal bleeding? *** Smoker?   *** Any pertinent FH of cervical or uterine cancer, breat cancer? *** Any particular pertinent personal medical history?  ***  HTn Subjective:  Omni SAMEENA ARTUS is a 36 y.o. female with hypertension. Current Outpatient Prescriptions  Medication Sig Dispense Refill  . albuterol (PROVENTIL HFA;VENTOLIN HFA) 108 (90 Base) MCG/ACT inhaler Inhale 2 puffs into the lungs every 6 (six) hours as needed for wheezing or shortness of breath. 1 Inhaler 2  . amLODipine (NORVASC) 5 MG tablet TAKE 1 TABLET BY MOUTH EVERY DAY 30 tablet 0  . aspirin 325 MG EC tablet Take 325 mg by mouth every 6 (six) hours as needed for pain.    . benzonatate (TESSALON) 200 MG capsule Take 1 capsule (200 mg total) by mouth 3 (three) times daily as needed for cough. 30 capsule 0  . fluticasone (FLONASE) 50 MCG/ACT nasal spray Place 2 sprays into both nostrils daily. 16 g 6  . hydrochlorothiazide (HYDRODIURIL) 25 MG tablet TAKE 1 TABLET BY MOUTH EVERY DAY 30 tablet 0  . ondansetron (ZOFRAN) 4 MG tablet Take 1 tablet (4 mg total) by mouth every 8 (eight) hours as needed for nausea or vomiting. 20 tablet 0   No current facility-administered medications for this visit.     Hypertension ROS: {htn cvs ros:315727::"taking medications as instructed","no medication side effects noted","no TIA's","no chest pain on exertion","no dyspnea on exertion","no swelling of ankles"}.  New concerns: ***.   Objective:  There were no vitals taken for this visit.  Appearance {appearance:315021::"alert, well appearing, and in no distress"}. General exam {htn  exam:315726::"BP noted to be well controlled today in office","S1, S2 normal, no gallop, no murmur, chest clear, no JVD, no HSM, no edema"}.  Lab review: {lab reviewed:315731}.   Assessment:   Hypertension {disease control degree:315147}.   Plan:  {disease follow up plans:315730}.     Smoking status reviewed  Review of Systems   Objective:  There were no vitals taken for this visit. Vitals and nursing note reviewed  General: well nourished, in no acute distress HEENT: normocephalic, TM's visualized bilaterally, no scleral icterus or conjunctival pallor, no nasal discharge, moist mucous membranes, good dentition without erythema or discharge noted in posterior oropharynx Neck: supple, non-tender, without lymphadenopathy Cardiac: RRR, clear S1 and S2, no murmurs, rubs, or gallops Respiratory: clear to auscultation bilaterally, no increased work of breathing Abdomen: soft, nontender, nondistended, no masses or organomegaly. Bowel sounds present Extremities: no edema or cyanosis. Warm, well perfused. 2+ radial and PT pulses bilaterally Skin: warm and dry, no rashes noted Neuro: alert and oriented, no focal deficits   Assessment & Plan:    No problem-specific Assessment & Plan notes found for this encounter.    No Follow-up on file.   Oralia Manis, DO, PGY-1

## 2017-02-05 ENCOUNTER — Ambulatory Visit (INDEPENDENT_AMBULATORY_CARE_PROVIDER_SITE_OTHER): Payer: Medicaid Other | Admitting: Family Medicine

## 2017-02-05 VITALS — BP 110/70 | HR 73 | Temp 99.1°F | Ht 65.0 in | Wt 316.0 lb

## 2017-02-05 DIAGNOSIS — J029 Acute pharyngitis, unspecified: Secondary | ICD-10-CM | POA: Insufficient documentation

## 2017-02-05 NOTE — Assessment & Plan Note (Signed)
CENTOR score 1, likely viral given presentation.  No need for rx medications.   OTC tylenol/zyrtek

## 2017-02-05 NOTE — Patient Instructions (Signed)
It was a pleasure to see you today! Thank you for choosing [facility] for your primary care. Holly [name] was seen for sore throat. Come back to the clinic if symptoms worsen, and go to the emergency room if you have any trouble breathing or eating.  You seem to have viral pharyngitis.  This does not require any medical treatment and you can take some OTC zyrtek and tylenol.    If we did any lab work today, and the results require attention, either me or my nurse will get in touch with you. If everything is normal, you will get a letter in mail and a message via . If you don't hear from us in two weeks, please give us a call. Otherwise, we look forward to seeing you again at your next visit. If you have any questions or concerns before then, please call the clinic at [phone].  Please bring all your medications to every doctors visit  Sign up for My Chart to have easy access to your labs results, and communication with your Primary care physician.    Please check-out at the front desk before leaving the clinic.    Best,  Dr. Marthenia RollingScott Sarabi Sockwell FAMILY MEDICINE RESIDENT - PGY1 02/05/2017 3:26 PM

## 2017-02-05 NOTE — Progress Notes (Signed)
    Subjective:  Holly [name] is a 36 y.o. female [facility] with a chief complaint of sore throat.   Patient complaining of sore throat and "losing her voice" since this morning.  She has not felt sick but her son has had a cough for a few days.   She does not complain of swollen lymph nodes or trouble swallowing.  She has not noticed fever symptoms and she has not been coughing.  She does not note any difficulty breathing.   She has not had any seasonal allergy problems or rhinorrhea.   Objective:  Physical Exam: BP 110/70   Pulse 73   Temp 99.1 F (37.3 C) (Oral)   Ht 5\' 5"  (1.651 m)   Wt (!) 316 lb (143.3 kg)   SpO2 99%   BMI 52.59 kg/m   Gen: NAD, resting comfortably Throat: no LED, no exudate on tonsils, although malampati would be elevated and she is hard to visualize, FROM intact CV: RRR with no murmurs appreciated Pulm: NWOB, CTAB with no crackles, wheezes, or rhonchi, no stridor  GI: Normal bowel sounds present. Soft, Nontender, Nondistended. MSK: no edema, cyanosis, or clubbing noted Skin: warm, dry Neuro: grossly normal, moves all extremities Psych: Normal affect and thought content  No results found for this or any previous visit (from the past 72 hour(s)).   Assessment/Plan:  Sore throat CENTOR score 1, likely viral given presentation.  No need for rx medications.   OTC tylenol/zyrtek   Marthenia RollingScott Ramelo Oetken, DO FAMILY MEDICINE RESIDENT - PGY1 02/05/2017 3:32 PM

## 2017-02-06 ENCOUNTER — Other Ambulatory Visit: Payer: Self-pay | Admitting: Family Medicine

## 2017-02-06 DIAGNOSIS — I10 Essential (primary) hypertension: Secondary | ICD-10-CM

## 2017-02-07 ENCOUNTER — Ambulatory Visit: Payer: Medicaid Other

## 2017-02-08 ENCOUNTER — Ambulatory Visit (INDEPENDENT_AMBULATORY_CARE_PROVIDER_SITE_OTHER): Payer: Medicaid Other | Admitting: Internal Medicine

## 2017-02-08 VITALS — BP 142/92 | HR 77 | Temp 99.2°F | Wt 310.0 lb

## 2017-02-08 DIAGNOSIS — J040 Acute laryngitis: Secondary | ICD-10-CM

## 2017-02-08 MED ORDER — BENZONATATE 200 MG PO CAPS: 200.0000 mg | ORAL_CAPSULE | Freq: Three times a day (TID) | ORAL | 0 refills | Status: DC | PRN

## 2017-02-08 MED ORDER — BENZOCAINE-MENTHOL 10-2.1 MG MT LOZG: 1.0000 | LOZENGE | Freq: Four times a day (QID) | OROMUCOSAL | 0 refills | Status: DC | PRN

## 2017-02-08 NOTE — Patient Instructions (Signed)
Please start using Tessalon Perles for your cough.  You can also use the lozenges I have written for which have some numbing medication in them.  You will probably start to feel better by Monday.  Continue using Flonase as discussed

## 2017-02-08 NOTE — Assessment & Plan Note (Signed)
Likely viral infection causing laryngitis and throat.  No fevers and no myalgias are not concerning for flu.  Patient has had the flu shot - symptomatic treatment of virus - benzonatate (TESSALON) 200 MG capsule; Take 1 capsule (200 mg total) by mouth 3 (three) times daily as needed for cough.  Dispense: 30 capsule; Refill: 0 - Benzocaine-Menthol 10-2.1 MG LOZG; Use as directed 1 lozenge in the mouth or throat 4 (four) times daily as needed.  Dispense: 18 each; Refill: 0 - Continue Flonase  - Work note  - Expectant improvement by Monday, follow up if worsening next week

## 2017-02-08 NOTE — Progress Notes (Signed)
   [facility] Noralee CharsAsiyah Jamella Grayer, MD Phone: [phone]  Reason For Visit: same day URI  # URI  Patient has been sick since Monday cold-like symptoms.  Patient initially started off with a Foley.  She developed a cough and congestion.today she has a sore throat this started yesterday.  Patient denies any fevers denies any headaches denies any myalgias.  Indicates feeling fatigued.  She has tried Tylenol and Flonase to help with symptoms.no shortness of breath, no rashes Nasal discharge: Endorses Medications tried: Tylenol, Flonase,  Sick contacts: no specific contacts  ROS see HPI Smoking Status noted  Objective: BP (!) 142/92   Pulse 77   Temp 99.2 F (37.3 C) (Oral)   Wt (!) 310 lb (140.6 kg)   SpO2 93%   BMI 51.59 kg/m  Gen: NAD, alert, cooperative with exam HEENT: Normal    Neck: No masses palpated. No lymphadenopathy    Ears: Tympanic membranes intact, normal light reflex, no erythema, no bulging    Eyes: PERRLA,    Nose: nasal turbinates congested     Throat: moist mucus membranes, no erythema, no tonsillar exudate, no swelling Cardio: regular rate and rhythm, S1S2 heard, no murmurs appreciated Pulm: clear to auscultation bilaterally, no wheezes, rhonchi or rales   Assessment/Plan: See problem based a/p  Laryngitis Likely viral infection causing laryngitis and throat.  No fevers and no myalgias are not concerning for flu.  Patient has had the flu shot - symptomatic treatment of virus - benzonatate (TESSALON) 200 MG capsule; Take 1 capsule (200 mg total) by mouth 3 (three) times daily as needed for cough.  Dispense: 30 capsule; Refill: 0 - Benzocaine-Menthol 10-2.1 MG LOZG; Use as directed 1 lozenge in the mouth or throat 4 (four) times daily as needed.  Dispense: 18 each; Refill: 0 - Continue Flonase  - Work note  - Expectant improvement by Monday, follow up if worsening next week

## 2017-02-13 ENCOUNTER — Emergency Department (HOSPITAL_COMMUNITY)
Admission: EM | Admit: 2017-02-13 | Discharge: 2017-02-13 | Discharge status: Against Medical Advice | Payer: Medicaid Other

## 2017-02-13 NOTE — ED Notes (Signed)
Called for triage x 3 with no answer. 

## 2017-03-15 NOTE — Progress Notes (Deleted)
   Subjective:    Patient ID: Holly [name], female    DOB: 11/18/1980, 36 y.o.   MRN: [id number]   CC: left ankle  HPI: Left ankle  Smoking status reviewed  Review of Systems   Objective:  There were no vitals taken for this visit. Vitals and nursing note reviewed  General: well nourished, in no acute distress HEENT: normocephalic, TM's visualized bilaterally, no scleral icterus or conjunctival pallor, no nasal discharge, moist mucous membranes, good dentition without erythema or discharge noted in posterior oropharynx Neck: supple, non-tender, without lymphadenopathy Cardiac: RRR, clear S1 and S2, no murmurs, rubs, or gallops Respiratory: clear to auscultation bilaterally, no increased work of breathing Abdomen: soft, nontender, nondistended, no masses or organomegaly. Bowel sounds present Extremities: no edema or cyanosis. Warm, well perfused. 2+ radial and PT pulses bilaterally Skin: warm and dry, no rashes noted Neuro: alert and oriented, no focal deficits   Assessment & Plan:    No problem-specific Assessment & Plan notes found for this encounter.    No Follow-up on file.   Oralia ManisSherin Philis Doke, DO, PGY-1

## 2017-03-16 ENCOUNTER — Ambulatory Visit: Payer: Medicaid Other | Admitting: Family Medicine

## 2017-05-17 ENCOUNTER — Other Ambulatory Visit: Payer: Self-pay | Admitting: Family Medicine

## 2017-05-17 DIAGNOSIS — I10 Essential (primary) hypertension: Secondary | ICD-10-CM

## 2017-05-18 NOTE — Telephone Encounter (Signed)
Will refill prescription but please inform patient that she has not been seen by me for HTN and has not been seen in clinic since September for HTN. She should schedule a follow up as soon as possible for HTN follow up.   

## 2017-05-18 NOTE — Telephone Encounter (Signed)
lmovm for pt to return call. Fleeger, Jessica Dawn, CMA  

## 2017-05-18 NOTE — Telephone Encounter (Signed)
Will refill prescription but please inform patient that she has not been seen by me for HTN and has not been seen in clinic since September for HTN. She should schedule a follow up as soon as possible for HTN follow up.

## 2017-05-21 NOTE — Telephone Encounter (Signed)
lmovm again.   Dr. Darin EngelsAbraham,  Would you like to create and route a letter to Admin so they can send it to the patient? Kadence Mimbs, Maryjo RochesterJessica Dawn, CMA

## 2017-05-22 ENCOUNTER — Encounter: Payer: Self-pay | Admitting: Family Medicine

## 2017-05-22 NOTE — Telephone Encounter (Signed)
Letter has been sent

## 2017-11-15 ENCOUNTER — Emergency Department (HOSPITAL_COMMUNITY)
Admission: EM | Admit: 2017-11-15 | Discharge: 2017-11-16 | Disposition: A | Discharge status: Home / Self Care | Payer: Medicaid Other | Attending: Emergency Medicine | Admitting: Emergency Medicine

## 2017-11-15 ENCOUNTER — Other Ambulatory Visit: Payer: Self-pay

## 2017-11-15 ENCOUNTER — Encounter (HOSPITAL_COMMUNITY): Payer: Self-pay | Admitting: Emergency Medicine

## 2017-11-15 DIAGNOSIS — L03115 Cellulitis of right lower limb: Secondary | ICD-10-CM

## 2017-11-15 DIAGNOSIS — Z7982 Long term (current) use of aspirin: Secondary | ICD-10-CM | POA: Insufficient documentation

## 2017-11-15 DIAGNOSIS — I10 Essential (primary) hypertension: Secondary | ICD-10-CM | POA: Insufficient documentation

## 2017-11-15 DIAGNOSIS — Z79899 Other long term (current) drug therapy: Secondary | ICD-10-CM | POA: Insufficient documentation

## 2017-11-15 NOTE — ED Triage Notes (Addendum)
Pt from home with c/o possible insect bite on back of right calf x 1 month. Pt denies feeling a bite or sting. Pt states she has noticed some blood tinged and yellow drainage from area. Pt denies fever but has felt intermittent nausea.  Pt is hypertensive at time of assessment and has hx of same

## 2017-11-16 MED ORDER — DOXYCYCLINE HYCLATE 100 MG PO CAPS
100.0000 mg | ORAL_CAPSULE | Freq: Two times a day (BID) | ORAL | 0 refills | Status: DC
Start: 2017-11-16 — End: 2019-07-01

## 2017-11-16 NOTE — ED Notes (Signed)
ED Provider at bedside. 

## 2017-11-16 NOTE — ED Provider Notes (Signed)
[facility]-EMERGENCY DEPT Provider Note   CSN: [id number] Arrival date & time: 11/15/17  2104     History   Chief Complaint Chief Complaint  Patient presents with  . Insect Bite    HPI Holly [name] is a 37 y.o. female.  HPI 37 year old female comes in a chief complaint of insect bite.  Patient reports that 2 months ago she noticed that she had redness in her foot.  Over time that area scabbed up, and she has been applying over-the-counter topical medications for symptom control.  Patient notes that over the past few days she has noticed some mild drainage which to her appear purulent.  Patient denies any associated nausea, vomiting, fevers, chills.  She does however have increased pain in the foot therefore decided to come to the ER for further evaluation.  Past Medical History:  Diagnosis Date  . Eczema    Hx: of  . Pregnancy   . Pregnancy induced hypertension   . Seasonal allergies    Hx: of    Patient Active Problem List   Diagnosis Date Noted  . Laryngitis 02/08/2017  . Sore throat 02/05/2017  . Acute left ankle pain 12/01/2016  . Nausea without vomiting 05/17/2016  . Asthma 02/18/2016  . 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  . Cough 02/23/2014  . Back pain 09/30/2012  . Annual physical exam 09/30/2012  . HTN (hypertension) 09/30/2012  . S/P cholecystectomy 08/29/2012  . Obesity 04/15/2012    Past Surgical History:  Procedure Laterality Date  . c-sectionx1    . CESAREAN SECTION    . CHOLECYSTECTOMY N/A 08/30/2012   Procedure: LAPAROSCOPIC CHOLECYSTECTOMY WITH INTRAOPERATIVE CHOLANGIOGRAM;  Surgeon: Shelly Rubenstein, MD;  Location: [facility] OR;  Service: General;  Laterality: N/A;  . NO PAST SURGERIES    . UPPER GI ENDOSCOPY     Hx: of     OB History    Gravida  3   Para  2   Term  2   Preterm      AB  1   Living  2     SAB  1   TAB      Ectopic      Multiple      Live Births  2            Home Medications     Prior to Admission medications   Medication Sig Start Date End Date Taking? Authorizing Provider  amLODipine (NORVASC) 5 MG tablet TAKE 1 TABLET BY MOUTH EVERY DAY 02/07/17  Yes Oralia Manis, DO  aspirin 325 MG EC tablet Take 325 mg by mouth every 6 (six) hours as needed for pain.   Yes [provider]  fluticasone (FLONASE) 50 MCG/ACT nasal spray Place 2 sprays into both nostrils daily. 05/17/16  Yes Arvilla Market, DO  hydrochlorothiazide (HYDRODIURIL) 25 MG tablet TAKE 1 TABLET BY MOUTH EVERY DAY 05/18/17  Yes Darin Engels, Sherin, DO  albuterol (PROVENTIL HFA;VENTOLIN HFA) 108 (90 Base) MCG/ACT inhaler Inhale 2 puffs into the lungs every 6 (six) hours as needed for wheezing or shortness of breath. Patient not taking: Reported on 11/15/2017 02/18/16   Velora Heckler A, MD  amLODipine (NORVASC) 5 MG tablet TAKE 1 TABLET BY MOUTH EVERY DAY Patient not taking: Reported on 11/15/2017 05/18/17   Oralia Manis, DO  Benzocaine-Menthol 10-2.1 MG LOZG Use as directed 1 lozenge in the mouth or throat 4 (four) times daily as needed. Patient not taking: Reported on 11/15/2017 02/08/17   Mikell,  Antionette PolesAsiyah Zahra, MD  benzonatate (TESSALON) 200 MG capsule Take 1 capsule (200 mg total) by mouth 3 (three) times daily as needed for cough. Patient not taking: Reported on 11/15/2017 02/08/17   Berton BonMikell, Asiyah Zahra, MD  doxycycline (VIBRAMYCIN) 100 MG capsule Take 1 capsule (100 mg total) by mouth 2 (two) times daily. 11/16/17   Derwood KaplanNanavati, Abron Neddo, MD  ondansetron (ZOFRAN) 4 MG tablet Take 1 tablet (4 mg total) by mouth every 8 (eight) hours as needed for nausea or vomiting. Patient not taking: Reported on 11/15/2017 05/17/16   Arvilla MarketWallace, Catherine Lauren, DO    Family History Family History  Problem Relation Age of Onset  . Hypertension Father   . Hyperlipidemia Father   . Lung cancer Paternal Grandmother   . Diabetes Maternal Grandfather   . Kidney disease Paternal Uncle   . Colon cancer Neg Hx   .  Esophageal cancer Neg Hx   . Stomach cancer Neg Hx     Social History Social History   Tobacco Use  . Smoking status: Never Smoker  . Smokeless tobacco: Never Used  Substance Use Topics  . Alcohol use: No  . Drug use: No     Allergies   Naproxen sodium   Review of Systems Review of Systems  Constitutional: Positive for activity change.  Skin: Positive for rash and wound.     Physical Exam Updated Vital Signs BP (!) 157/81   Pulse 65   Temp 98.5 F (36.9 C) (Oral)   Resp 20   SpO2 97%   Physical Exam  Constitutional: She appears well-developed.  HENT:  Head: Normocephalic and atraumatic.  Eyes: EOM are normal.  Neck: Normal range of motion. Neck supple.  Cardiovascular: Normal rate.  Pulmonary/Chest: Effort normal.  Abdominal: Bowel sounds are normal.  Neurological: She is alert.  Skin: Skin is warm.  Patient has a small scab, without any fluctuance in the right leg.  There is no active drainage.  Mild tenderness to palpation without significant erythema or any induration.  Nursing note and vitals reviewed.    ED Treatments / Results  Labs (all labs ordered are listed, but only abnormal results are displayed) Labs Reviewed - No data to display  EKG None  Radiology No results found.  Procedures Procedures (including critical care time)  Medications Ordered in ED Medications - No data to display   Initial Impression / Assessment and Plan / ED Course  I have reviewed the triage vital signs and the nursing notes.  Pertinent labs & imaging results that were available during my care of the patient were reviewed by me and considered in my medical decision making (see chart for details).     37 year old female comes in with chief complaint of insect bite.  It seems like patient has a scab that has been present for several weeks now, over time the symptoms have persisted and she has noted mild drainage.  On exam there is no abscess for us to drain,  however this could be a sign of early infection.  We will start patient on doxycycline and have her follow-up with the PCP in 1 week.  Final Clinical Impressions(s) / ED Diagnoses   Final diagnoses:  Cellulitis of right lower extremity    ED Discharge Orders        Ordered    doxycycline (VIBRAMYCIN) 100 MG capsule  2 times daily     11/16/17 0032       Derwood KaplanNanavati, Jaimeson Gopal, MD 11/16/17 0040

## 2017-11-16 NOTE — Discharge Instructions (Signed)
Or  

## 2017-12-30 ENCOUNTER — Emergency Department (HOSPITAL_COMMUNITY)
Admission: EM | Admit: 2017-12-30 | Discharge: 2017-12-30 | Disposition: A | Discharge status: Home / Self Care | Payer: Self-pay | Attending: Emergency Medicine | Admitting: Emergency Medicine

## 2017-12-30 ENCOUNTER — Other Ambulatory Visit: Payer: Self-pay

## 2017-12-30 ENCOUNTER — Encounter (HOSPITAL_COMMUNITY): Payer: Self-pay | Admitting: Emergency Medicine

## 2017-12-30 ENCOUNTER — Emergency Department (HOSPITAL_COMMUNITY): Payer: Self-pay

## 2017-12-30 DIAGNOSIS — X58XXXA Exposure to other specified factors, initial encounter: Secondary | ICD-10-CM | POA: Insufficient documentation

## 2017-12-30 DIAGNOSIS — Z7982 Long term (current) use of aspirin: Secondary | ICD-10-CM | POA: Insufficient documentation

## 2017-12-30 DIAGNOSIS — Y999 Unspecified external cause status: Secondary | ICD-10-CM | POA: Insufficient documentation

## 2017-12-30 DIAGNOSIS — J45909 Unspecified asthma, uncomplicated: Secondary | ICD-10-CM | POA: Insufficient documentation

## 2017-12-30 DIAGNOSIS — M79671 Pain in right foot: Secondary | ICD-10-CM | POA: Insufficient documentation

## 2017-12-30 DIAGNOSIS — Y929 Unspecified place or not applicable: Secondary | ICD-10-CM | POA: Insufficient documentation

## 2017-12-30 DIAGNOSIS — Y939 Activity, unspecified: Secondary | ICD-10-CM | POA: Insufficient documentation

## 2017-12-30 DIAGNOSIS — Z79899 Other long term (current) drug therapy: Secondary | ICD-10-CM | POA: Insufficient documentation

## 2017-12-30 DIAGNOSIS — S81801A Unspecified open wound, right lower leg, initial encounter: Secondary | ICD-10-CM | POA: Insufficient documentation

## 2017-12-30 DIAGNOSIS — S81802A Unspecified open wound, left lower leg, initial encounter: Secondary | ICD-10-CM | POA: Insufficient documentation

## 2017-12-30 DIAGNOSIS — I10 Essential (primary) hypertension: Secondary | ICD-10-CM | POA: Insufficient documentation

## 2017-12-30 MED ORDER — AMLODIPINE BESYLATE 5 MG PO TABS: 5.0000 mg | ORAL_TABLET | Freq: Every day | ORAL | 0 refills | Status: DC

## 2017-12-30 MED ORDER — AMLODIPINE BESYLATE 5 MG PO TABS
5.0000 mg | ORAL_TABLET | Freq: Once | ORAL | Status: AC
  Administered 2017-12-30: 5 mg via ORAL
  Filled 2017-12-30: qty 1

## 2017-12-30 MED ORDER — HYDROCHLOROTHIAZIDE 25 MG PO TABS: 25.0000 mg | ORAL_TABLET | Freq: Every day | ORAL | 0 refills | Status: DC

## 2017-12-30 MED ORDER — IBUPROFEN 200 MG PO TABS
600.0000 mg | ORAL_TABLET | Freq: Once | ORAL | Status: AC
  Administered 2017-12-30: 600 mg via ORAL
  Filled 2017-12-30: qty 3

## 2017-12-30 MED ORDER — HYDROCHLOROTHIAZIDE 25 MG PO TABS
25.0000 mg | ORAL_TABLET | Freq: Every day | ORAL | Status: DC
  Administered 2017-12-30: 25 mg via ORAL
  Filled 2017-12-30: qty 1

## 2017-12-30 NOTE — ED Notes (Signed)
Pt states she has been out of bp meds x over 1 week and is aware that bp is high and to get meds filled.

## 2017-12-30 NOTE — ED Triage Notes (Signed)
Pt woke and had R heel pain. Pt placed boot on R foot from when she had ankle injury and states it has relieved some of the pain. Denies injury.

## 2017-12-30 NOTE — ED Provider Notes (Signed)
[facility]-EMERGENCY DEPT Provider Note   CSN: [id number] Arrival date & time: 12/30/17  1519     History   Chief Complaint Chief Complaint  Patient presents with  . Foot Pain    HPI Holly [name] is a 37 y.o. female with history of eczema, hypertension presents for evaluation of acute onset, progressively worsening right heel pain since yesterday.  She states she noticed pain when she awoke yesterday but worsened this morning.  Pain worsens with ambulation.  Pain is localized to the posterior aspect of the right heel and radiates up the posterior aspect of the right lower extremity.  Notes mild right ankle swelling.  Denies numbness or tingling.  No fevers or chills.  She has tried wearing a cam walker which she has from a previous ankle injury which has been helpful.  Stretching worsened the pain.  No medications prior to arrival.  She also notes an area of chronic wound to the right lower extremity and left lower extremity that has been present since August.  She was seen and evaluated in the ED for this.  She notes that the lesions have improved but have not entirely healed.  She denies any drainage.  She has been applying topical antibiotic ointment.  She also notes she ran out of her blood pressure medicines a few days ago.  She currently does not have health insurance but she does have a PCP.  Denies headache, vision changes,, weakness, chest pain, shortness of breath, abdominal pain, or decreased urine production.   The history is provided by the patient.    Past Medical History:  Diagnosis Date  . Eczema    Hx: of  . Pregnancy   . Pregnancy induced hypertension   . Seasonal allergies    Hx: of    Patient Active Problem List   Diagnosis Date Noted  . Laryngitis 02/08/2017  . Sore throat 02/05/2017  . Acute left ankle pain 12/01/2016  . Nausea without vomiting 05/17/2016  . Asthma 02/18/2016  . 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  . Cough  02/23/2014  . Back pain 09/30/2012  . Annual physical exam 09/30/2012  . HTN (hypertension) 09/30/2012  . S/P cholecystectomy 08/29/2012  . Obesity 04/15/2012    Past Surgical History:  Procedure Laterality Date  . c-sectionx1    . CESAREAN SECTION    . CHOLECYSTECTOMY N/A 08/30/2012   Procedure: LAPAROSCOPIC CHOLECYSTECTOMY WITH INTRAOPERATIVE CHOLANGIOGRAM;  Surgeon: Shelly Rubenstein, MD;  Location: [facility] OR;  Service: General;  Laterality: N/A;  . NO PAST SURGERIES    . UPPER GI ENDOSCOPY     Hx: of     OB History    Gravida  3   Para  2   Term  2   Preterm      AB  1   Living  2     SAB  1   TAB      Ectopic      Multiple      Live Births  2            Home Medications    Prior to Admission medications   Medication Sig Start Date End Date Taking? Authorizing Provider  albuterol (PROVENTIL HFA;VENTOLIN HFA) 108 (90 Base) MCG/ACT inhaler Inhale 2 puffs into the lungs every 6 (six) hours as needed for wheezing or shortness of breath. Patient not taking: Reported on 11/15/2017 02/18/16   Velora Heckler A, MD  amLODipine (NORVASC) 5 MG tablet Take 1 tablet (  5 mg total) by mouth daily. 12/30/17   Luevenia Maxin, Jasdeep Dejarnett A, PA-C  aspirin 325 MG EC tablet Take 325 mg by mouth every 6 (six) hours as needed for pain.    [provider]  Benzocaine-Menthol 10-2.1 MG LOZG Use as directed 1 lozenge in the mouth or throat 4 (four) times daily as needed. Patient not taking: Reported on 11/15/2017 02/08/17   Berton Bon, MD  benzonatate (TESSALON) 200 MG capsule Take 1 capsule (200 mg total) by mouth 3 (three) times daily as needed for cough. Patient not taking: Reported on 11/15/2017 02/08/17   Berton Bon, MD  doxycycline (VIBRAMYCIN) 100 MG capsule Take 1 capsule (100 mg total) by mouth 2 (two) times daily. 11/16/17   Derwood Kaplan, MD  fluticasone (FLONASE) 50 MCG/ACT nasal spray Place 2 sprays into both nostrils daily. 05/17/16   Arvilla Market, DO   hydrochlorothiazide (HYDRODIURIL) 25 MG tablet Take 1 tablet (25 mg total) by mouth daily. 12/30/17   Sylvestre Rathgeber A, PA-C  ondansetron (ZOFRAN) 4 MG tablet Take 1 tablet (4 mg total) by mouth every 8 (eight) hours as needed for nausea or vomiting. Patient not taking: Reported on 11/15/2017 05/17/16   Arvilla Market, DO    Family History Family History  Problem Relation Age of Onset  . Hypertension Father   . Hyperlipidemia Father   . Lung cancer Paternal Grandmother   . Diabetes Maternal Grandfather   . Kidney disease Paternal Uncle   . Colon cancer Neg Hx   . Esophageal cancer Neg Hx   . Stomach cancer Neg Hx     Social History Social History   Tobacco Use  . Smoking status: Never Smoker  . Smokeless tobacco: Never Used  Substance Use Topics  . Alcohol use: No  . Drug use: No     Allergies   Naproxen sodium   Review of Systems Review of Systems  Constitutional: Negative for chills and fever.  Eyes: Negative for visual disturbance.  Respiratory: Negative for shortness of breath.   Cardiovascular: Negative for chest pain.  Gastrointestinal: Negative for abdominal pain.  Genitourinary: Negative for decreased urine volume and difficulty urinating.  Musculoskeletal: Positive for arthralgias.  Skin: Positive for wound.  Neurological: Negative for syncope, weakness, numbness and headaches.  All other systems reviewed and are negative.    Physical Exam Updated Vital Signs BP (!) 199/118 (BP Location: Left Arm)   Pulse (!) 103   Temp 98.6 F (37 C) (Oral)   Resp 18   SpO2 95%   Physical Exam  Constitutional: She is oriented to person, place, and time. She appears well-developed and well-nourished. No distress.  HENT:  Head: Normocephalic and atraumatic.  Eyes: Conjunctivae are normal. Right eye exhibits no discharge. Left eye exhibits no discharge.  Neck: No JVD present. No tracheal deviation present.  Cardiovascular: Normal rate and intact distal  pulses.  2+ radial and DP/PT pulses bilaterally, Homans sign absent bilaterally, no lower extremity edema, no palpable cords, compartments are soft   Pulmonary/Chest: Effort normal.  Abdominal: She exhibits no distension.  Musculoskeletal: She exhibits tenderness. She exhibits no edema.  Right heel focally tender overlying the insertion point of the Achilles tendon and along the posterior heel.  No erythema, induration, swelling, or crepitus.  Thompson test negative, with good plantar flexion of the right foot with calf squeeze.  No ligamentous laxity noted.  No varus or valgus instability noted on examination of the right ankle.  5/5 strength of  BLE major muscle groups.  Neurological: She is alert and oriented to person, place, and time. No cranial nerve deficit or sensory deficit. She exhibits normal muscle tone.  Fluent speech, no facial droop, sensation intact to soft touch of extremities, ambulates with an antalgic gait, avoiding to bear weight on the right heel but is able to toe walk without difficulty  Skin: Skin is warm and dry. No erythema.  1 cm shallow wound to the medial aspect of the distal right calf and posterior aspect of the left calf.  No surrounding erythema, induration, or tenderness.  No abnormal drainage.  Healthy-appearing granulation tissue noted.  Psychiatric: She has a normal mood and affect. Her behavior is normal.  Nursing note and vitals reviewed.    ED Treatments / Results  Labs (all labs ordered are listed, but only abnormal results are displayed) Labs Reviewed - No data to display  EKG None  Radiology Dg Foot Complete Right  Result Date: 12/30/2017 CLINICAL DATA:  Right heel pain, no known injury, initial encounter EXAM: RIGHT FOOT COMPLETE - 3+ VIEW COMPARISON:  None. FINDINGS: Small calcaneal spur is noted. Mild soft tissue swelling is noted about the metatarsals. No acute fracture or dislocation is seen. Mild degenerative changes of the tarsal bones are  seen. IMPRESSION: No acute bony abnormality noted. Mild degenerative changes are seen. Mild soft tissue swelling. Electronically Signed   By: Alcide Clever M.D.   On: 12/30/2017 16:02    Procedures Procedures (including critical care time)  Medications Ordered in ED Medications  amLODipine (NORVASC) tablet 5 mg (has no administration in time range)  hydrochlorothiazide (HYDRODIURIL) tablet 25 mg (has no administration in time range)     Initial Impression / Assessment and Plan / ED Course  I have reviewed the triage vital signs and the nursing notes.  Pertinent labs & imaging results that were available during my care of the patient were reviewed by me and considered in my medical decision making (see chart for details).      Patient presents for evaluation of acute onset, progressively worsening atraumatic right heel pain since yesterday.  She is afebrile, mildly tachycardic and hypertensive but has not had her hypertension medicines for 3 days now.  On reevaluation she is not tachycardic and her blood pressure has improved and is closer to her baseline.  She is completely asymptomatic otherwise.  We did give a dose of her home medicines and I will refill her pressure medicines for 1 month.  She also has small chronic wounds to the bilateral lower extremities which are less than 1 cm in diameter and do not appear to have any superimposed infection.  We discussed follow-up at [facility] for reevaluation of her hypertension and wounds but we will also refer her to the wound clinic.  Does not require antibiotics at this time.  With regards to her heel pain she has focal tenderness overlying the insertion point of the right Achilles tendon and the posterior right heel suggesting Achilles tendinopathy and possible enthesopathy or subcutaneous bursitis.  She is neurovascularly intact.  Radiographs reviewed by me show no acute osseous abnormality but do show mild soft tissue swelling and  chronic degenerative changes including calcaneal spur overlying the insertion point of the Achilles tendon.  No evidence of osteomyelitis, septic joint, DVT. RICE therapy indicated and discussed with patient.  She has a cam walker which she has been using, we will add crutches to use for comfort.  Recommend follow-up with  PCP or orthopedist for reevaluation of symptoms.  Discussed strict ED return precautions. Pt verbalized understanding of and agreement with plan and is safe for discharge home at this time.  Final Clinical Impressions(s) / ED Diagnoses   Final diagnoses:  Pain of right heel  Hypertension, unspecified type  Wound of right lower extremity, initial encounter  Wound of left lower extremity, initial encounter    ED Discharge Orders         Ordered    amLODipine (NORVASC) 5 MG tablet  Daily     12/30/17 1645    hydrochlorothiazide (HYDRODIURIL) 25 MG tablet  Daily     12/30/17 [address]1645           Bianna Haran, CraigMina A, PA-C 12/30/17 1714    Loren RacerYelverton, David, MD 12/30/17 2220

## 2017-12-30 NOTE — Discharge Instructions (Addendum)
Your history and physical examination and x-rays were consistent with Achilles tendinitis.  You may alternate 600 mg of ibuprofen and [phone] mg of Tylenol every 3 hours as needed for pain for the next few days. Do not exceed 4000 mg of Tylenol daily.  Take ibuprofen with food to avoid upset stomach issues.  Do some gentle stretching exercises in the morning and throughout the day to help with your pain.  I have attached some exercises to your paperwork but you can also search Achilles tendinitis physical therapy on YouTube.  I have also attached instructions on how to tape your feet which can help ease your pain.  Wear the CAM walker for comfort.  Follow-up with podiatry for reevaluation of your symptoms.  They may recommend  custom orthotics.  Make sure to wear shoes that fit well.  You can add padding to the back of your shoes where the tendon is for comfort.  Return to the emergency department if any concerning signs or symptoms develop such as fevers, severe swelling, loss of pulses or pallor to the extremity, or persistent numbness/weakness.  If your blood pressure (BP) was elevated on multiple readings during this visit above 130 for the top number or above 80 for the bottom number, please have this repeated by your primary care provider within one month. You can also check your blood pressure when you are out at a pharmacy or grocery store. Many have machines that will check your blood pressure.  If your blood pressure remains elevated, please follow-up with your PCP.  Keep your wounds clean and dry.  Return to the emergency department if any concerning signs or symptoms develop such as worsening redness, streaking of redness up the skin, pain, abnormal drainage, or fevers.

## 2018-02-25 IMAGING — CT CT HEAD W/O CM
3 of 4 series · 15 of 47 positions shown, 18 images · non-contrast
Comparison: Head CT 01/29/2011

CLINICAL DATA: Pt states she went swimming yesterday and then
developed pain in bilateral eyes and burning last night. Pt states
blurred vision at times. Pt states pain is worse when eyes are
closed.

EXAM:
CT HEAD WITHOUT CONTRAST
TECHNIQUE: Contiguous axial images were obtained from the base of the skull
through the vertex without intravenous contrast.

[Series 2: head w/o · axial · non-contrast · 0.42mm/px · z∈[-158,-18]mm · 9 of 34 slices shown, 12 images]
[im 3/34  brain]
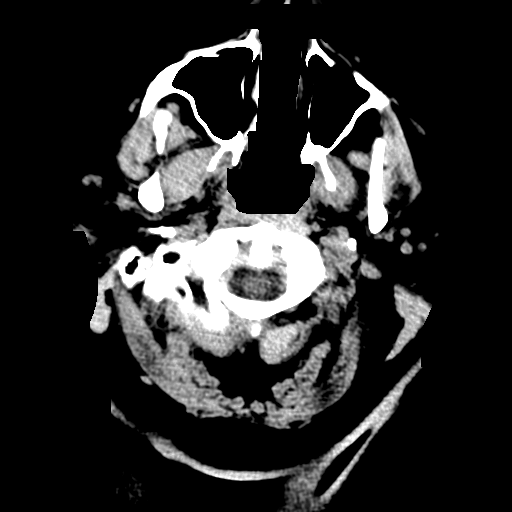
[im 3/34  bone]
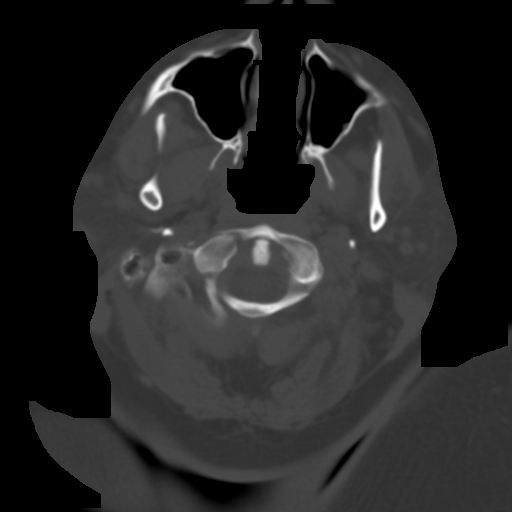
[im 8/34  brain]
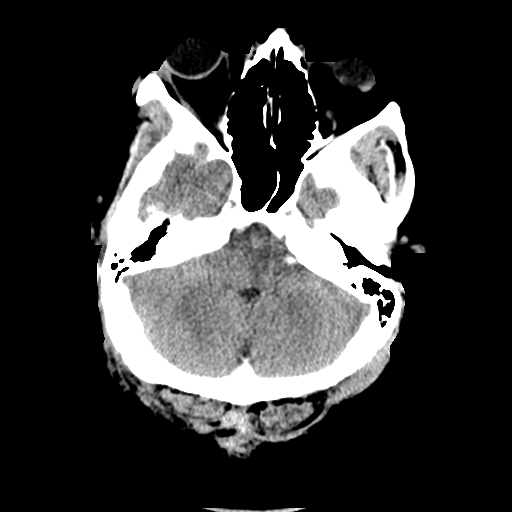
[im 10/34  brain]
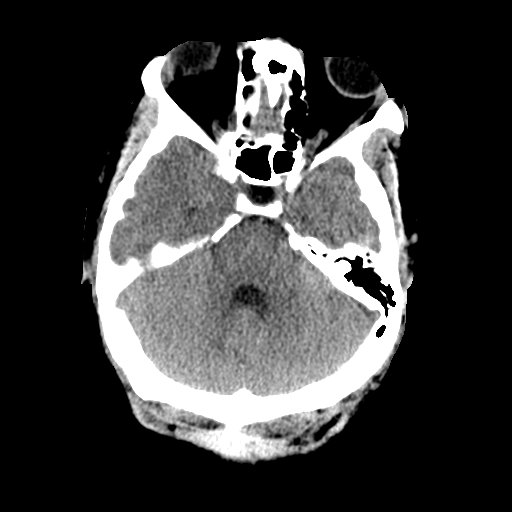
[im 15/34  brain]
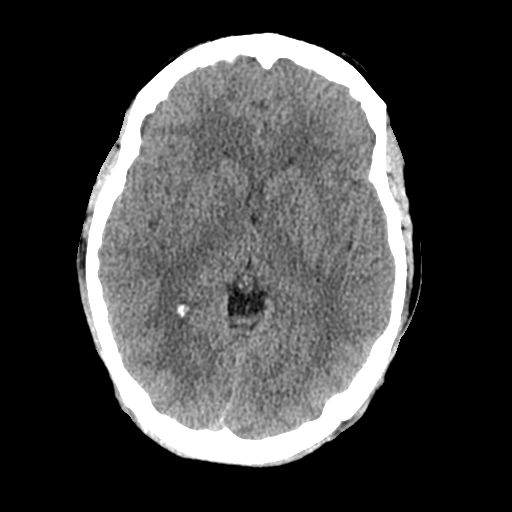
[im 17/34  brain]
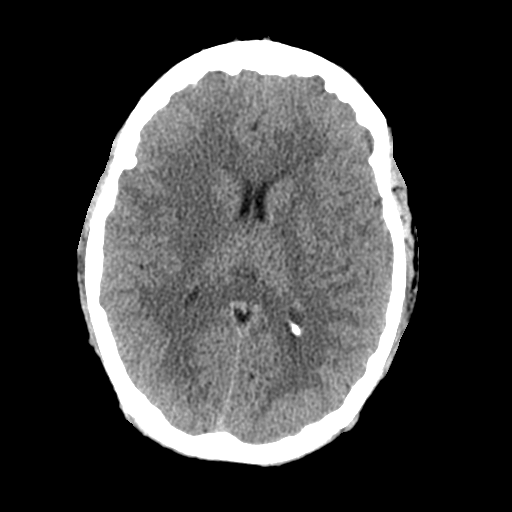
[im 17/34  bone]
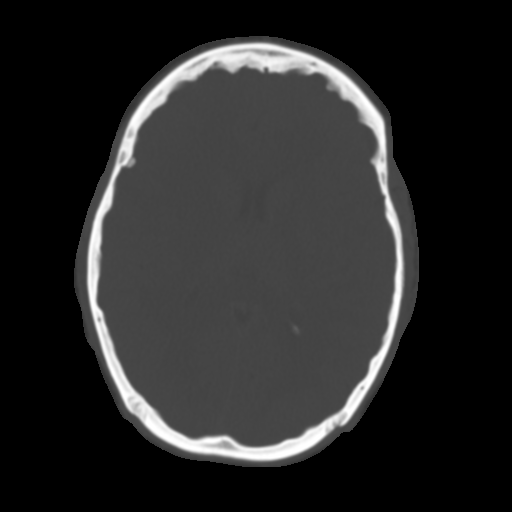
[im 19/34  brain]
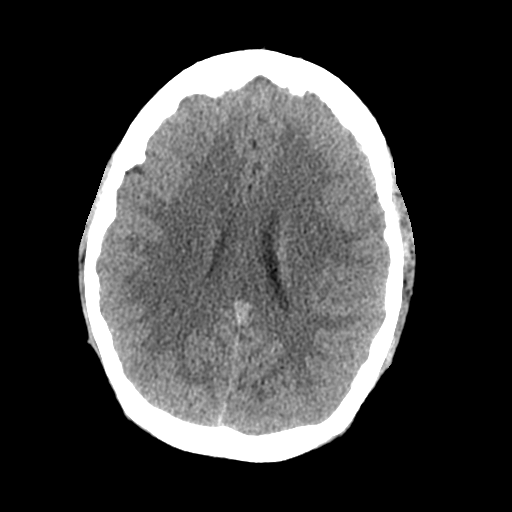
[im 24/34  brain]
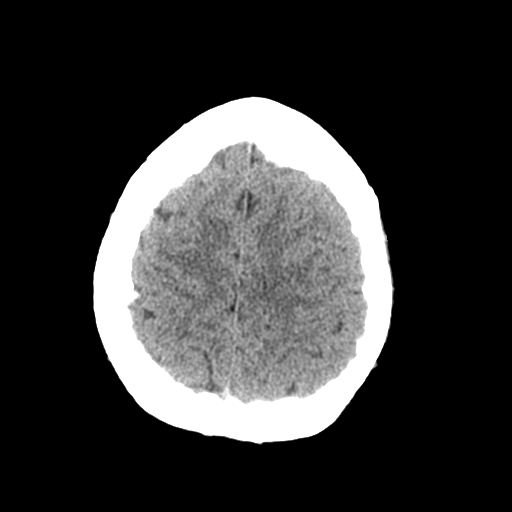
[im 26/34  brain]
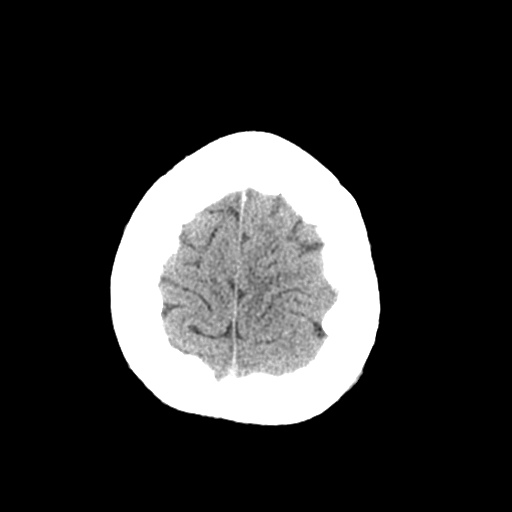
[im 31/34  brain]
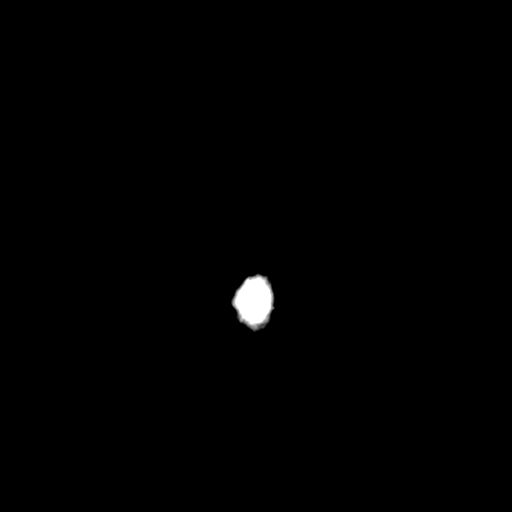
[im 31/34  bone]
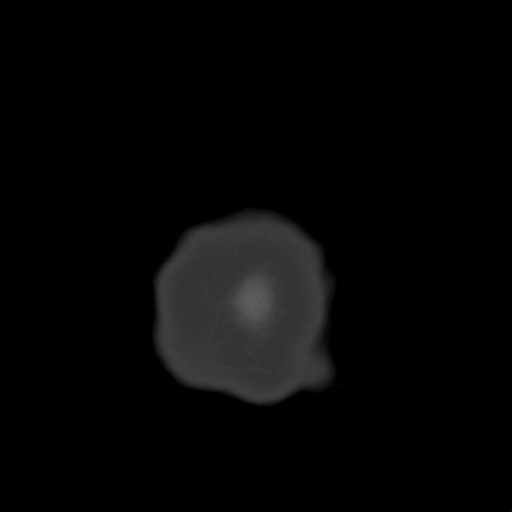

[Series 4: coronal · coronal · 0.31mm/px · 3 of 71 slices shown]
[im 24/71  brain]
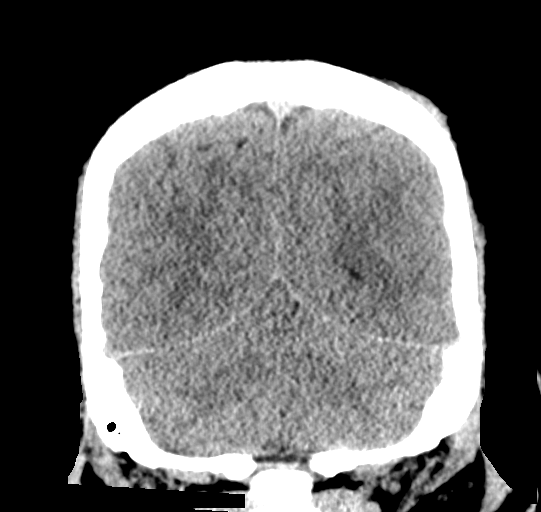
[im 32/71  brain]
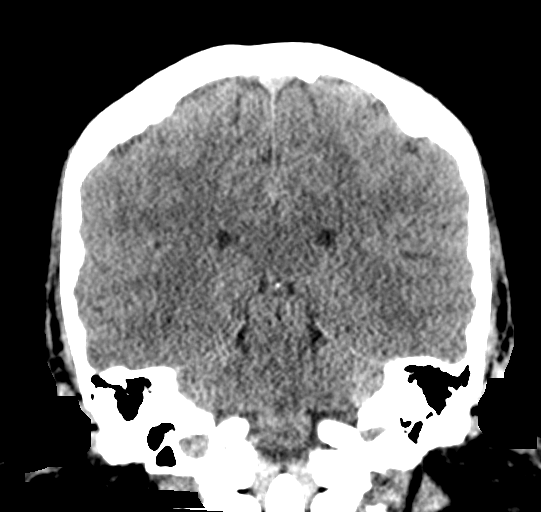
[im 39/71  brain]
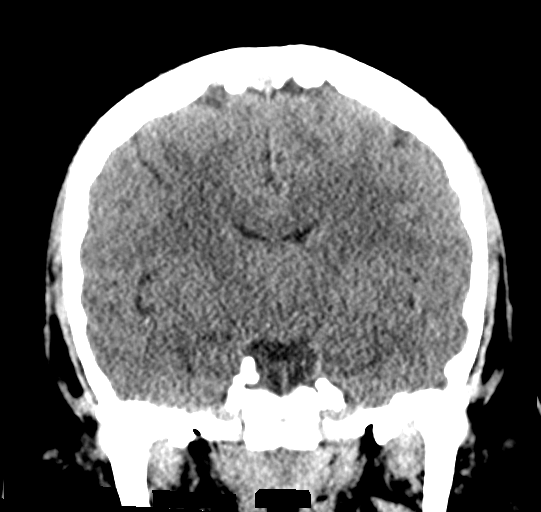

[Series 5: sagittal · sagittal · 0.31mm/px · 3 of 56 slices shown]
[im 19/56  brain]
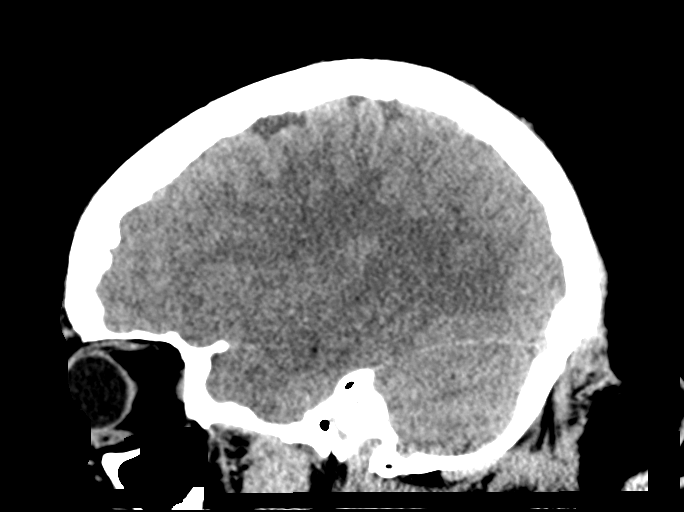
[im 28/56  brain]
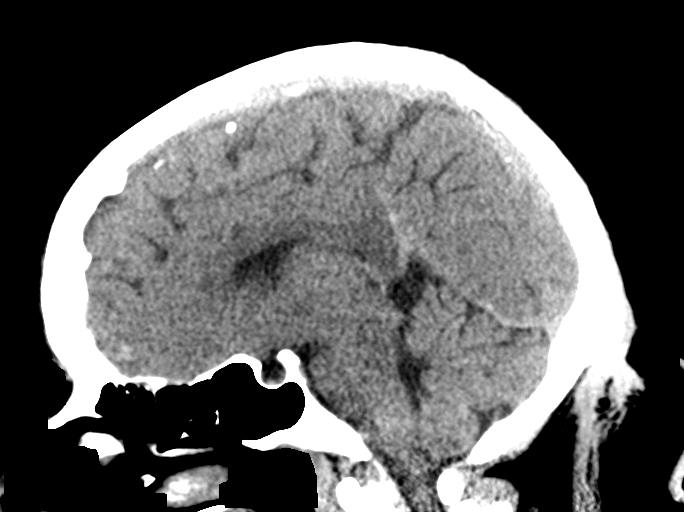
[im 37/56  brain]
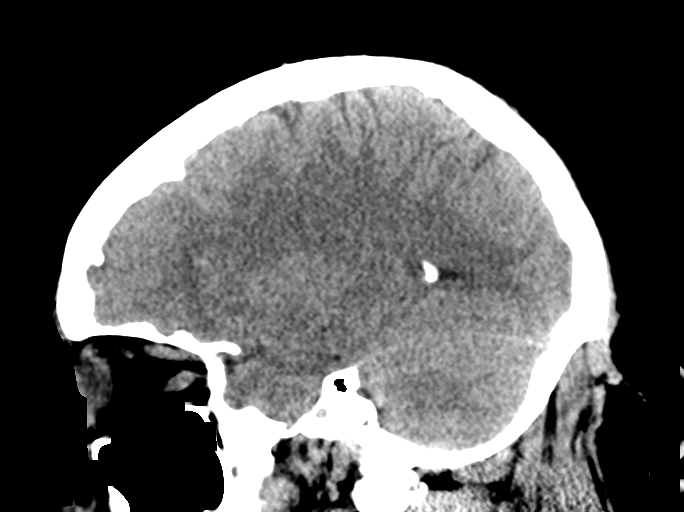

[15 of 47 positions shown; findings below may reference images not displayed]

FINDINGS: Brain: No acute intracranial hemorrhage. No focal mass lesion. No CT
evidence of acute infarction. No midline shift or mass effect. No
hydrocephalus. Basilar cisterns are patent.

Vascular: No hyperdense vessel or unexpected calcification.

Skull: Normal. Negative for fracture or focal lesion.

Sinuses/Orbits: Paranasal sinuses and mastoid air cells are clear.
Orbits are clear. Globes are normal.

Other: None.
IMPRESSION: Normal head CT.

## 2018-05-13 ENCOUNTER — Emergency Department (HOSPITAL_COMMUNITY)
Admission: EM | Admit: 2018-05-13 | Discharge: 2018-05-13 | Disposition: A | Discharge status: Home / Self Care | Payer: Self-pay | Attending: Emergency Medicine | Admitting: Emergency Medicine

## 2018-05-13 ENCOUNTER — Encounter (HOSPITAL_COMMUNITY): Payer: Self-pay

## 2018-05-13 ENCOUNTER — Other Ambulatory Visit: Payer: Self-pay

## 2018-05-13 ENCOUNTER — Emergency Department (HOSPITAL_COMMUNITY): Payer: Self-pay

## 2018-05-13 DIAGNOSIS — J45909 Unspecified asthma, uncomplicated: Secondary | ICD-10-CM | POA: Insufficient documentation

## 2018-05-13 DIAGNOSIS — J040 Acute laryngitis: Secondary | ICD-10-CM

## 2018-05-13 DIAGNOSIS — Z79899 Other long term (current) drug therapy: Secondary | ICD-10-CM | POA: Insufficient documentation

## 2018-05-13 DIAGNOSIS — I10 Essential (primary) hypertension: Secondary | ICD-10-CM | POA: Insufficient documentation

## 2018-05-13 DIAGNOSIS — J209 Acute bronchitis, unspecified: Secondary | ICD-10-CM | POA: Insufficient documentation

## 2018-05-13 MED ORDER — BENZONATATE 200 MG PO CAPS: 200.0000 mg | ORAL_CAPSULE | Freq: Three times a day (TID) | ORAL | 0 refills | Status: DC | PRN

## 2018-05-13 MED ORDER — ALBUTEROL SULFATE HFA 108 (90 BASE) MCG/ACT IN AERS: 2.0000 | INHALATION_SPRAY | Freq: Four times a day (QID) | RESPIRATORY_TRACT | 2 refills | Status: DC | PRN

## 2018-05-13 NOTE — Discharge Instructions (Addendum)
Use inhaler as needed as prescribed for cough.  You may also use Tessalon as prescribed for cough. Follow-up with your primary care provider, return to ER for worsening or concerning symptoms.

## 2018-05-13 NOTE — ED Triage Notes (Signed)
Pt states she has been coughing and having a hard time catching her breath. Pt believes that she is getting over the flu. Pt states that her cough is sometimes productive, and she gets up mucous. Pt states she saw a small amount of blood in her cough mucous yesterday.

## 2018-05-13 NOTE — ED Provider Notes (Signed)
[facility]-EMERGENCY DEPT Provider Note   CSN: [id number] Arrival date & time: 05/13/18  1228     History   Chief Complaint Chief Complaint  Patient presents with  . Cough    HPI Holly [name] is a 38 y.o. female.  38 year old female presents with complaint of cough.  Patient suspects she may have had the flu at the end of December, continues to have a cough, occasionally productive.  Patient denies fevers or chills.  Patient has a history of exercise-induced asthma, has an inhaler to use as needed, has not used her inhaler lately.  No other complaints or concerns.     Past Medical History:  Diagnosis Date  . Eczema    Hx: of  . Pregnancy   . Pregnancy induced hypertension   . Seasonal allergies    Hx: of    Patient Active Problem List   Diagnosis Date Noted  . Laryngitis 02/08/2017  . Sore throat 02/05/2017  . Acute left ankle pain 12/01/2016  . Nausea without vomiting 05/17/2016  . Asthma 02/18/2016  . 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  . Cough 02/23/2014  . Back pain 09/30/2012  . Annual physical exam 09/30/2012  . HTN (hypertension) 09/30/2012  . S/P cholecystectomy 08/29/2012  . Obesity 04/15/2012    Past Surgical History:  Procedure Laterality Date  . c-sectionx1    . CESAREAN SECTION    . CHOLECYSTECTOMY N/A 08/30/2012   Procedure: LAPAROSCOPIC CHOLECYSTECTOMY WITH INTRAOPERATIVE CHOLANGIOGRAM;  Surgeon: Shelly Rubenstein, MD;  Location: [facility] OR;  Service: General;  Laterality: N/A;  . NO PAST SURGERIES    . UPPER GI ENDOSCOPY     Hx: of     OB History    Gravida  3   Para  2   Term  2   Preterm      AB  1   Living  2     SAB  1   TAB      Ectopic      Multiple      Live Births  2            Home Medications    Prior to Admission medications   Medication Sig Start Date End Date Taking? Authorizing Provider  albuterol (PROVENTIL HFA;VENTOLIN HFA) 108 (90 Base) MCG/ACT inhaler Inhale 2 puffs into the  lungs every 6 (six) hours as needed for wheezing or shortness of breath. 05/13/18   Jeannie Fend, PA-C  amLODipine (NORVASC) 5 MG tablet Take 1 tablet (5 mg total) by mouth daily. 12/30/17   Luevenia Maxin, Mina A, PA-C  aspirin 325 MG EC tablet Take 325 mg by mouth every 6 (six) hours as needed for pain.    [provider]  Benzocaine-Menthol 10-2.1 MG LOZG Use as directed 1 lozenge in the mouth or throat 4 (four) times daily as needed. Patient not taking: Reported on 11/15/2017 02/08/17   Berton Bon, MD  benzonatate (TESSALON) 200 MG capsule Take 1 capsule (200 mg total) by mouth 3 (three) times daily as needed for cough. 05/13/18   Jeannie Fend, PA-C  doxycycline (VIBRAMYCIN) 100 MG capsule Take 1 capsule (100 mg total) by mouth 2 (two) times daily. 11/16/17   Derwood Kaplan, MD  fluticasone (FLONASE) 50 MCG/ACT nasal spray Place 2 sprays into both nostrils daily. 05/17/16   Arvilla Market, DO  hydrochlorothiazide (HYDRODIURIL) 25 MG tablet Take 1 tablet (25 mg total) by mouth daily. 12/30/17   Jeanie Sewer, PA-C  ondansetron (ZOFRAN) 4 MG tablet Take 1 tablet (4 mg total) by mouth every 8 (eight) hours as needed for nausea or vomiting. Patient not taking: Reported on 11/15/2017 05/17/16   Arvilla MarketWallace, Catherine Lauren, DO    Family History Family History  Problem Relation Age of Onset  . Hypertension Father   . Hyperlipidemia Father   . Lung cancer Paternal Grandmother   . Diabetes Maternal Grandfather   . Kidney disease Paternal Uncle   . Colon cancer Neg Hx   . Esophageal cancer Neg Hx   . Stomach cancer Neg Hx     Social History Social History   Tobacco Use  . Smoking status: Never Smoker  . Smokeless tobacco: Never Used  Substance Use Topics  . Alcohol use: No  . Drug use: No     Allergies   Naproxen sodium   Review of Systems Review of Systems  Constitutional: Negative for chills and fever.  HENT: Negative for congestion, sinus pressure, sinus pain,  sneezing and sore throat.   Respiratory: Positive for cough. Negative for wheezing.   Gastrointestinal: Negative for nausea and vomiting.  Skin: Negative for rash and wound.  Allergic/Immunologic: Negative for immunocompromised state.  Neurological: Negative for weakness and headaches.  Hematological: Negative for adenopathy.  Psychiatric/Behavioral: Negative for confusion.  All other systems reviewed and are negative.    Physical Exam Updated Vital Signs BP (!) 160/106   Pulse 80   Temp 98.6 F (37 C) (Oral)   Resp 18   Ht 5\' 4"  (1.626 m)   Wt 135.2 kg   SpO2 98%   BMI 51.15 kg/m   Physical Exam Vitals signs and nursing note reviewed.  Constitutional:      General: She is not in acute distress.    Appearance: She is well-developed. She is not diaphoretic.  HENT:     Head: Normocephalic and atraumatic.     Right Ear: Tympanic membrane and ear canal normal.     Left Ear: Tympanic membrane and ear canal normal.     Nose: Nose normal. No congestion or rhinorrhea.  Eyes:     Conjunctiva/sclera: Conjunctivae normal.  Cardiovascular:     Rate and Rhythm: Normal rate and regular rhythm.     Pulses: Normal pulses.     Heart sounds: Normal heart sounds. No murmur.  Pulmonary:     Effort: Pulmonary effort is normal.     Breath sounds: Normal breath sounds.  Skin:    General: Skin is warm and dry.     Findings: No erythema or rash.  Neurological:     Mental Status: She is alert and oriented to person, place, and time.  Psychiatric:        Behavior: Behavior normal.      ED Treatments / Results  Labs (all labs ordered are listed, but only abnormal results are displayed) Labs Reviewed - No data to display  EKG None  Radiology Dg Chest 2 View  Result Date: 05/13/2018 CLINICAL DATA:  Cough and difficulty breathing EXAM: CHEST - 2 VIEW COMPARISON:  08/08/2013 FINDINGS: The heart size and mediastinal contours are within normal limits. Both lungs are clear. The  visualized skeletal structures are unremarkable. IMPRESSION: No active cardiopulmonary disease. Electronically Signed   By: Alcide CleverMark  Lukens M.D.   On: 05/13/2018 14:04    Procedures Procedures (including critical care time)  Medications Ordered in ED Medications - No data to display   Initial Impression / Assessment and Plan / ED Course  I  have reviewed the triage vital signs and the nursing notes.  Pertinent labs & imaging results that were available during my care of the patient were reviewed by me and considered in my medical decision making (see chart for details).  Clinical Course as of May 13 1416  Mon May 13, 2018  [id number] 38 year old female presents with cough ongoing x3 to 4 weeks after having the flu.  Denies any fever, chills, wheezing.  Patient reports coughing to the point of near posttussive emesis.  On exam patient is well-appearing, lung sounds are clear, no chest wall tenderness.  X-ray is clear, no evidence of pneumonia at this time.  Discussed with patient bronchospasm versus bronchitis, likely viral following her flulike illness.  Recommend Tessalon, she can use her inhaler for reported bronchospasm as well.  Patient should follow-up with PCP, given referral if needed.   [LM]    Clinical Course User Index [LM] Jeannie Fend, PA-C   Final Clinical Impressions(s) / ED Diagnoses   Final diagnoses:  Acute bronchitis, unspecified organism    ED Discharge Orders         Ordered    benzonatate (TESSALON) 200 MG capsule  3 times daily PRN     05/13/18 1411    albuterol (PROVENTIL HFA;VENTOLIN HFA) 108 (90 Base) MCG/ACT inhaler  Every 6 hours PRN     05/13/18 1411           Jeannie Fend, PA-C 05/13/18 1418    Azalia Bilis, MD 05/16/18 [phone]

## 2018-05-17 ENCOUNTER — Encounter (HOSPITAL_COMMUNITY): Payer: Self-pay | Admitting: Emergency Medicine

## 2018-05-17 ENCOUNTER — Emergency Department (HOSPITAL_COMMUNITY)
Admission: EM | Admit: 2018-05-17 | Discharge: 2018-05-17 | Disposition: A | Discharge status: Home / Self Care | Payer: Self-pay | Attending: Emergency Medicine | Admitting: Emergency Medicine

## 2018-05-17 ENCOUNTER — Other Ambulatory Visit: Payer: Self-pay

## 2018-05-17 DIAGNOSIS — R059 Cough, unspecified: Secondary | ICD-10-CM

## 2018-05-17 DIAGNOSIS — Z79899 Other long term (current) drug therapy: Secondary | ICD-10-CM | POA: Insufficient documentation

## 2018-05-17 DIAGNOSIS — J111 Influenza due to unidentified influenza virus with other respiratory manifestations: Secondary | ICD-10-CM | POA: Insufficient documentation

## 2018-05-17 DIAGNOSIS — R6889 Other general symptoms and signs: Secondary | ICD-10-CM

## 2018-05-17 DIAGNOSIS — I10 Essential (primary) hypertension: Secondary | ICD-10-CM | POA: Insufficient documentation

## 2018-05-17 DIAGNOSIS — R05 Cough: Secondary | ICD-10-CM

## 2018-05-17 DIAGNOSIS — J45909 Unspecified asthma, uncomplicated: Secondary | ICD-10-CM | POA: Insufficient documentation

## 2018-05-17 MED ORDER — PREDNISONE 50 MG PO TABS: ORAL_TABLET | ORAL | 0 refills | Status: DC

## 2018-05-17 MED ORDER — HYDROCOD POLST-CPM POLST ER 10-8 MG/5ML PO SUER
5.0000 mL | Freq: Once | ORAL | Status: AC
  Administered 2018-05-17: 5 mL via ORAL
  Filled 2018-05-17: qty 5

## 2018-05-17 MED ORDER — PREDNISONE 20 MG PO TABS
60.0000 mg | ORAL_TABLET | Freq: Once | ORAL | Status: AC
  Administered 2018-05-17: 60 mg via ORAL
  Filled 2018-05-17: qty 3

## 2018-05-17 MED ORDER — GUAIFENESIN-CODEINE 100-10 MG/5ML PO SYRP
5.0000 mL | ORAL_SOLUTION | Freq: Three times a day (TID) | ORAL | 0 refills | Status: DC | PRN
Start: 2018-05-17 — End: 2019-07-01

## 2018-05-17 MED ORDER — IPRATROPIUM-ALBUTEROL 0.5-2.5 (3) MG/3ML IN SOLN
3.0000 mL | Freq: Once | RESPIRATORY_TRACT | Status: AC
  Administered 2018-05-17: 3 mL via RESPIRATORY_TRACT
  Filled 2018-05-17: qty 3

## 2018-05-17 NOTE — ED Provider Notes (Signed)
[facility]-EMERGENCY DEPT Provider Note   CSN: [id number] Arrival date & time: 05/17/18  1219     History   Chief Complaint Chief Complaint  Patient presents with  . Cough  . congestion  . Shortness of Breath    HPI Keymiah Holly [name] is a 38 y.o. female who presents to the ED with flu like symptoms. Patient reports cough and shortness of breath. Patient was evaluated 05/13/2018 for same symptoms and given Tessalon and Albuterol inhaler. Patient reports taking the medication as directed without relief.    The history is provided by the patient. No language interpreter was used.  Cough  Cough characteristics:  Non-productive Severity:  Moderate Onset quality:  Gradual Duration:  4 weeks Progression:  Waxing and waning Relieved by:  Nothing Worsened by:  Lying down Associated symptoms: myalgias, shortness of breath and wheezing   Associated symptoms: no ear pain, no eye discharge, no fever, no rash and no sore throat  Chest pain: with cough.   Shortness of Breath  Associated symptoms: cough and wheezing   Associated symptoms: no abdominal pain, no ear pain, no fever, no rash and no sore throat  Chest pain: with cough. Vomiting: occasionally with coughing hard.     Past Medical History:  Diagnosis Date  . Eczema    Hx: of  . Pregnancy   . Pregnancy induced hypertension   . Seasonal allergies    Hx: of    Patient Active Problem List   Diagnosis Date Noted  . Laryngitis 02/08/2017  . Sore throat 02/05/2017  . Acute left ankle pain 12/01/2016  . Nausea without vomiting 05/17/2016  . Asthma 02/18/2016  . 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  . Cough 02/23/2014  . Back pain 09/30/2012  . Annual physical exam 09/30/2012  . HTN (hypertension) 09/30/2012  . S/P cholecystectomy 08/29/2012  . Obesity 04/15/2012    Past Surgical History:  Procedure Laterality Date  . c-sectionx1    . CESAREAN SECTION    . CHOLECYSTECTOMY N/A 08/30/2012   Procedure:  LAPAROSCOPIC CHOLECYSTECTOMY WITH INTRAOPERATIVE CHOLANGIOGRAM;  Surgeon: Shelly Rubenstein, MD;  Location: [facility] OR;  Service: General;  Laterality: N/A;  . NO PAST SURGERIES    . UPPER GI ENDOSCOPY     Hx: of     OB History    Gravida  3   Para  2   Term  2   Preterm      AB  1   Living  2     SAB  1   TAB      Ectopic      Multiple      Live Births  2            Home Medications    Prior to Admission medications   Medication Sig Start Date End Date Taking? Authorizing Provider  albuterol (PROVENTIL HFA;VENTOLIN HFA) 108 (90 Base) MCG/ACT inhaler Inhale 2 puffs into the lungs every 6 (six) hours as needed for wheezing or shortness of breath. 05/13/18   Jeannie Fend, PA-C  amLODipine (NORVASC) 5 MG tablet Take 1 tablet (5 mg total) by mouth daily. 12/30/17   Luevenia Maxin, Mina A, PA-C  aspirin 325 MG EC tablet Take 325 mg by mouth every 6 (six) hours as needed for pain.    [provider]  Benzocaine-Menthol 10-2.1 MG LOZG Use as directed 1 lozenge in the mouth or throat 4 (four) times daily as needed. Patient not taking: Reported on 11/15/2017 02/08/17  Mikell, Antionette PolesAsiyah Zahra, MD  benzonatate (TESSALON) 200 MG capsule Take 1 capsule (200 mg total) by mouth 3 (three) times daily as needed for cough. 05/13/18   Jeannie FendMurphy, Laura A, PA-C  doxycycline (VIBRAMYCIN) 100 MG capsule Take 1 capsule (100 mg total) by mouth 2 (two) times daily. 11/16/17   Derwood KaplanNanavati, Ankit, MD  fluticasone (FLONASE) 50 MCG/ACT nasal spray Place 2 sprays into both nostrils daily. 05/17/16   Arvilla MarketWallace, Catherine Lauren, DO  guaiFENesin-codeine (ROBITUSSIN AC) 100-10 MG/5ML syrup Take 5 mLs by mouth 3 (three) times daily as needed for cough. 05/17/18   Janne NapoleonNeese, Hope M, NP  hydrochlorothiazide (HYDRODIURIL) 25 MG tablet Take 1 tablet (25 mg total) by mouth daily. 12/30/17   Fawze, Mina A, PA-C  ondansetron (ZOFRAN) 4 MG tablet Take 1 tablet (4 mg total) by mouth every 8 (eight) hours as needed for nausea or  vomiting. Patient not taking: Reported on 11/15/2017 05/17/16   Arvilla MarketWallace, Catherine Lauren, DO  predniSONE (DELTASONE) 50 MG tablet Starting 05/18/18 take one tablet PO daily with breakfast 05/17/18   Janne NapoleonNeese, Hope M, NP    Family History Family History  Problem Relation Age of Onset  . Hypertension Father   . Hyperlipidemia Father   . Lung cancer Paternal Grandmother   . Diabetes Maternal Grandfather   . Kidney disease Paternal Uncle   . Colon cancer Neg Hx   . Esophageal cancer Neg Hx   . Stomach cancer Neg Hx     Social History Social History   Tobacco Use  . Smoking status: Never Smoker  . Smokeless tobacco: Never Used  Substance Use Topics  . Alcohol use: No  . Drug use: No     Allergies   Naproxen sodium   Review of Systems Review of Systems  Constitutional: Negative for fever.  HENT: Positive for congestion. Negative for ear pain and sore throat.   Eyes: Negative for discharge and redness.  Respiratory: Positive for cough, shortness of breath and wheezing.   Cardiovascular: Chest pain: with cough.  Gastrointestinal: Negative for abdominal pain, diarrhea and nausea. Vomiting: occasionally with coughing hard.  Musculoskeletal: Positive for myalgias.  Skin: Negative for rash.  Neurological: Negative for syncope.  Hematological: Negative for adenopathy.  Psychiatric/Behavioral: Negative for confusion.     Physical Exam Updated Vital Signs BP (!) 139/98 (BP Location: Right Arm)   Pulse 78   Temp 97.6 F (36.4 C) (Oral)   Resp 14   SpO2 98%   Physical Exam Vitals signs and nursing note reviewed.  Constitutional:      General: She is not in acute distress.    Appearance: She is obese.  HENT:     Head: Normocephalic.     Mouth/Throat:     Mouth: Mucous membranes are moist.     Pharynx: No pharyngeal swelling or oropharyngeal exudate.  Eyes:     Extraocular Movements: Extraocular movements intact.  Neck:     Musculoskeletal: Neck supple.  Cardiovascular:       Rate and Rhythm: Normal rate and regular rhythm.  Pulmonary:     Effort: Pulmonary effort is normal.     Breath sounds: Wheezing present. No decreased breath sounds, rhonchi or rales.  Chest:     Chest wall: No tenderness.  Musculoskeletal: Normal range of motion.  Lymphadenopathy:     Cervical: No cervical adenopathy.  Skin:    General: Skin is warm and dry.  Neurological:     Mental Status: She is alert and oriented to person,  place, and time.  Psychiatric:        Mood and Affect: Mood normal.      ED Treatments / Results  Labs (all labs ordered are listed, but only abnormal results are displayed) Labs Reviewed - No data to display  Radiology No results found.  Procedures Procedures (including critical care time)  Medications Ordered in ED Medications  ipratropium-albuterol (DUONEB) 0.5-2.5 (3) MG/3ML nebulizer solution 3 mL (3 mLs Nebulization Given 05/17/18 1327)  predniSONE (DELTASONE) tablet 60 mg (60 mg Oral Given 05/17/18 1355)  chlorpheniramine-HYDROcodone (TUSSIONEX) 10-8 MG/5ML suspension 5 mL (5 mLs Oral Given 05/17/18 1356)   After neb treatment re examined, lungs are clear.  Initial Impression / Assessment and Plan / ED Course  I have reviewed the triage vital signs and the nursing notes.  38 y.o. female here with continued cough and congestion since last ED visit stable for d/c without respiratory distress and improved with neb treatment. Will give short steroid burst and add Robitussin AC for cough. [facility] She will return here as needed for problems.   Final Clinical Impressions(s) / ED Diagnoses   Final diagnoses:  Cough  Flu-like symptoms    ED Discharge Orders         Ordered    predniSONE (DELTASONE) 50 MG tablet     05/17/18 1430    guaiFENesin-codeine (ROBITUSSIN AC) 100-10 MG/5ML syrup  3 times daily PRN     05/17/18 1430           Damian Leavelleese, McGrewHope M, NP 05/17/18 2149    Wynetta FinesMessick, Peter C,  MD 05/19/18 [phone]

## 2018-05-17 NOTE — Discharge Instructions (Addendum)
Call [facility] for follow up. Tell them you were seen here twice for flu like symptoms and do not have insurance to go back to [facility].

## 2018-05-17 NOTE — ED Triage Notes (Signed)
Pt c/o continued cough and shortness of breath. Pt with decreased lung sounds in right lower, pt states she feels tightness in lungs like she is not moving air. Given inhaler at that time and is using it every 6 hours. Pt states she continues with congestion.

## 2018-05-17 NOTE — ED Notes (Signed)
Pt reports hx of HTN--reports she did not take her BP medication this morning

## 2018-07-01 ENCOUNTER — Other Ambulatory Visit: Payer: Self-pay | Admitting: Family Medicine

## 2018-07-01 DIAGNOSIS — I10 Essential (primary) hypertension: Secondary | ICD-10-CM

## 2018-10-30 ENCOUNTER — Other Ambulatory Visit: Payer: Self-pay

## 2018-10-30 DIAGNOSIS — Z20822 Contact with and (suspected) exposure to covid-19: Secondary | ICD-10-CM

## 2018-11-03 LAB — NOVEL CORONAVIRUS, NAA: SARS-CoV-2, NAA: NOT DETECTED

## 2019-06-26 ENCOUNTER — Other Ambulatory Visit: Payer: Self-pay

## 2019-06-26 ENCOUNTER — Ambulatory Visit (INDEPENDENT_AMBULATORY_CARE_PROVIDER_SITE_OTHER): Payer: Self-pay | Admitting: Family Medicine

## 2019-06-26 VITALS — BP 134/90 | HR 94 | Ht 65.0 in | Wt 305.5 lb

## 2019-06-26 DIAGNOSIS — K625 Hemorrhage of anus and rectum: Secondary | ICD-10-CM

## 2019-06-26 LAB — POCT HEMOGLOBIN: Hemoglobin: 13.7 g/dL (ref 11–14.6)

## 2019-06-26 NOTE — Progress Notes (Addendum)
   Subjective:   Patient ID: Holly [name]    DOB: May 26, 1980, 39 y.o. female   MRN: [id number]  AFNAN [name] is a 39 y.o. female with a history of hypertension, asthma, eczema, obesity, status post cholecystectomy 2014 here for rectal bleeding.  Rectal Bleeding: Patient here today for 1 week history of rectal bleeding.  Notes the bleeding is bright red blood. She notes the bleeding stopped yesterday. She notes it wasn't very much bleeding, just a small amount on the stool and when she wiped. Notes it initially was painful when she wiped so she treated it with coconut oil and aloe vera which did improve the pain. Denies any history of constipation, notes her bowel movement are soft and regular (1-2x/day).  Notes she had this once before a long time ago that self resolved. Denies any unexplained weight loss or night sweats.  Denies any family history of colon cancer.  Denies any alcohol use.  Notes her stools are usually between 3 4 or 5 on the [facility] stool chart.  She notes she had a colonoscopy in 2013 due to an episode of similar bleeding in the past.  Review of Systems:  Per HPI.   PMFSH, medications and smoking status reviewed.  Objective:   BP 134/90   Pulse 94   Ht 5\' 5"  (1.651 m)   Wt (!) 305 lb 8 oz (138.6 kg)   SpO2 99%   BMI 50.84 kg/m  Vitals and nursing note reviewed.  General: well nourished, well developed, in no acute distress with non-toxic appearance Resp: Breathing comfortably on room air, speaking in full sentences Abdomen: soft, non-tender, non-distended, normoactive bowel sounds Skin: warm, dry Extremities: warm and well perfused MSK:  gait normal Neuro: Alert and oriented, speech normal  Anoscopy: No blood present on exam. External anus with large skin tags. No fissures present. One moderately sized internal hemorrhoid present at 7 oclock. No acutely bleeding. Normal anal sphincter tone.   Prior labs reviewed:  POC Hemoglobin today is 13.7.    Colonoscopy in 2013: Overall normal colon, without any abnormalities  CT abdomen/pelvis 2012: Perirectal lymphadenopathy.  Rectal neoplasm would be a distinct consideration.  There is some wall thickening in the rectum, but no discrete mass lesion can be identified.  At that time the patient reports she was having blood in her stools along with a change in her bowel habit  Assessment & Plan:   Rectal bleeding Acute on chronic/recurrent, Resolved. Prior labs and imaging reviewed. Internal hemorrhoid appreciated on anoscopy with no acute bleeding present, two large anal skin tags present indicating likely prior hemorrhoids. No signs of fissures. No red flag symptoms and hemoglobin today 13.7. Prior colonoscopy in 2013 normal. Expect acute bleeding secondary to internal hemorrhoid. Given bleeding has stopped and hemoglobin stable, recommended patient continue to monitor at this time. She was amendable to this plan. Discussed maintaining adequate fiber intake and soft stools to avoid future irritation of hemorrhoids and bleeding. She understood and agreed to plan.   Orders Placed This Encounter  Procedures  . POCT hemoglobin   No orders of the defined types were placed in this encounter.   2014, DO PGY-2, [facility] 06/29/2019 4:18 PM

## 2019-06-26 NOTE — Patient Instructions (Signed)
Thank you for coming to see me today. It was a pleasure to see you.   Appears you have internal hemorrhoids. Please try to continue to ensure your stools are soft and regular with plenty of water and fiber. Lease return if this recurs. Your hemoglobin (which is a test that looks for anemia) is normal today.   If you have any questions or concerns, please do not hesitate to call the office at [phone].  Take Care,   Dr. Orpah Cobb, DO Resident Physician [facility] [phone]

## 2019-06-29 NOTE — Assessment & Plan Note (Addendum)
Acute on chronic/recurrent, Resolved. Prior labs and imaging reviewed. Internal hemorrhoid appreciated on anoscopy with no acute bleeding present, two large anal skin tags present indicating likely prior hemorrhoids. No signs of fissures. No red flag symptoms and hemoglobin today 13.7. Prior colonoscopy in 2013 normal. Expect acute bleeding secondary to internal hemorrhoid. Given bleeding has stopped and hemoglobin stable, recommended patient continue to monitor at this time. She was amendable to this plan. Discussed maintaining adequate fiber intake and soft stools to avoid future irritation of hemorrhoids and bleeding. She understood and agreed to plan.

## 2019-07-01 ENCOUNTER — Other Ambulatory Visit: Payer: Self-pay

## 2019-07-01 ENCOUNTER — Encounter: Payer: Self-pay | Admitting: Family Medicine

## 2019-07-01 ENCOUNTER — Ambulatory Visit (INDEPENDENT_AMBULATORY_CARE_PROVIDER_SITE_OTHER): Payer: Self-pay | Admitting: Family Medicine

## 2019-07-01 VITALS — BP 175/95 | HR 83 | Wt 305.0 lb

## 2019-07-01 DIAGNOSIS — I10 Essential (primary) hypertension: Secondary | ICD-10-CM

## 2019-07-01 DIAGNOSIS — R739 Hyperglycemia, unspecified: Secondary | ICD-10-CM

## 2019-07-01 DIAGNOSIS — R519 Headache, unspecified: Secondary | ICD-10-CM

## 2019-07-01 MED ORDER — CYCLOBENZAPRINE HCL 10 MG PO TABS: 10.0000 mg | ORAL_TABLET | Freq: Three times a day (TID) | ORAL | 0 refills | Status: DC | PRN

## 2019-07-01 NOTE — Progress Notes (Signed)
    SUBJECTIVE:   CHIEF COMPLAINT / HPI:   Patient complaining of headaches for the last 2 days.  She says that they are slightly worse than her normal ones, she did not have an aura but she does have photosensitivity and sensitivity to sound.  She describes no changes in her vision, no balance issues, no injuries, no slurring of speech or mental confusion.  She has had no injuries or traumas.  She is not on any anticoagulants  PERTINENT  PMH / PSH: History of mild headaches that she has not been on any long-term medication for  OBJECTIVE:   BP (!) 175/95   Pulse 83   Wt (!) 305 lb (138.3 kg)   SpO2 99%   BMI 50.75 kg/m   General: Pleasant and alert, no acute distress Respiratory: Clear to auscultation bilaterally, no cough, no increased work of breathing Cardiac: No new murmur, regular rate and rhythm Neuro: No focal neuro deficits Psych: Alert and processing information appropriately  ASSESSMENT/PLAN:   Hyperglycemia Incidental finding on BMP for hypertension, this was not fasting and so is possible this was incidental but at 180 given her weight and no A1c on record in the past 4 years is reasonable to check A1c.  Future lab order will be placed and patient will be told she can come back when she is able  Nonintractable episodic headache Worse headache than normal for the last 2 days, no concerning neuro symptoms, also with hypertension to 170 systolic.  Patient has had some back and neck pain for the last few days but there is no nuchal rigidity.  We will offer some Flexeril, advised to come back in a few days to discuss BMP and recheck blood pressure.  HTN (hypertension) Uncontrolled 170 systolic, patient has headache with no neuro symptoms, will get BMP to follow-up on potential of kidney issues.  Advised to take antihypertensives and follow-up in a few days for recheck.     Marthenia Rolling, DO [facility]

## 2019-07-01 NOTE — Patient Instructions (Signed)
It was a pleasure to see you today! Thank you for choosing [facility] for your primary care. Holly [name] was seen for headache. Come back to the clinic in a few days once you are feeling better so that we can recheck your blood pressure.  Today we talked about your concern for your headaches.  We do think your blood pressure is high and we have not checked your blood labs in a while so we do this today, we will let you know if there is anything that needs to be changed.  We are giving you some Flexeril which we think should calm down the muscle tension which might be contributing to your headaches.  If you notice any symptoms of chest pain or stroke symptoms please go to the emergency department immediately.  Flexeril does make some folks drowsy do not drive if you take this medicine.  If you days once her headache is calm down we would like to see you again in the office so that we can get a blood pressure when you are feeling better so that we can decide if we need to do any more medication.   Please bring all your medications to every doctors visit   Sign up for My Chart to have easy access to your labs results, and communication with your Primary care physician.     Please check-out at the front desk before leaving the clinic.     Best,  Dr. Marthenia Rolling FAMILY MEDICINE RESIDENT - PGY3 07/01/2019 4:47 PM

## 2019-07-02 LAB — CBC
Hematocrit: 43 % (ref 34.0–46.6)
Hemoglobin: 14.6 g/dL (ref 11.1–15.9)
MCH: 27.8 pg (ref 26.6–33.0)
MCHC: 34 g/dL (ref 31.5–35.7)
MCV: 82 fL (ref 79–97)
Platelets: 473 10*3/uL — ABNORMAL HIGH (ref 150–450)
RBC: 5.26 x10E6/uL (ref 3.77–5.28)
RDW: 13.1 % (ref 11.7–15.4)
WBC: 10.6 10*3/uL (ref 3.4–10.8)

## 2019-07-02 LAB — BASIC METABOLIC PANEL
BUN/Creatinine Ratio: 10 (ref 9–23)
BUN: 8 mg/dL (ref 6–20)
CO2: 24 mmol/L (ref 20–29)
Calcium: 9.6 mg/dL (ref 8.7–10.2)
Chloride: 98 mmol/L (ref 96–106)
Creatinine, Ser: 0.77 mg/dL (ref 0.57–1.00)
GFR calc Af Amer: 112 mL/min/{1.73_m2} (ref >59)
GFR calc non Af Amer: 98 mL/min/{1.73_m2} (ref >59)
Glucose: 186 mg/dL — ABNORMAL HIGH (ref 65–99)
Potassium: 3.8 mmol/L (ref 3.5–5.2)
Sodium: 138 mmol/L (ref 134–144)

## 2019-07-03 NOTE — Progress Notes (Deleted)
   Subjective:   Patient ID: Holly [name]    DOB: 21-Oct-1980, 39 y.o. female   MRN: [id number]  Holly [name] is a 39 y.o. female with a history of HTN, asthma, eczema, obesity, and s/p cholecystecomy here for blood pressure recheck.  Elevated BP: Patient was seen on 3/16 for migraines and found to have elevated blood pressure to 175/95. BMP with normal electrolytes and kidney's. CBC unremarkable with normal hemoglobin. She is currently on Amlodipine 5mg  QD and HCTZ 25mg  QD. BP today ***.     Review of Systems:  Per HPI.   Objective:   There were no vitals taken for this visit. Vitals and nursing note reviewed.  General: well nourished, well developed, in no acute distress with non-toxic appearance HEENT: normocephalic, atraumatic, moist mucous membranes Neck: supple, non-tender without lymphadenopathy CV: regular rate and rhythm without murmurs, rubs, or gallops, no lower extremity edema Lungs: clear to auscultation bilaterally with normal work of breathing Abdomen: soft, non-tender, non-distended, no masses or organomegaly palpable, normoactive bowel sounds Skin: warm, dry, no rashes or lesions Extremities: warm and well perfused, normal tone MSK: ROM grossly intact, strength intact, gait normal Neuro: Alert and oriented, speech normal  Assessment & Plan:   No problem-specific Assessment & Plan notes found for this encounter.  No orders of the defined types were placed in this encounter.  No orders of the defined types were placed in this encounter.   , DO PGY-2, [facility] 07/03/2019 4:10 PM

## 2019-07-04 ENCOUNTER — Ambulatory Visit: Payer: Self-pay | Admitting: Family Medicine

## 2019-07-04 ENCOUNTER — Other Ambulatory Visit: Payer: Self-pay

## 2019-07-04 ENCOUNTER — Ambulatory Visit (INDEPENDENT_AMBULATORY_CARE_PROVIDER_SITE_OTHER): Payer: Self-pay | Admitting: Family Medicine

## 2019-07-04 VITALS — BP 108/64 | HR 100 | Wt 307.8 lb

## 2019-07-04 DIAGNOSIS — I10 Essential (primary) hypertension: Secondary | ICD-10-CM

## 2019-07-04 NOTE — Progress Notes (Signed)
    SUBJECTIVE:   CHIEF COMPLAINT / HPI:   Patient presented to clinic to get a work note and wanted to know the results of her recent lab work. Her CBC and BMP were largely normal, platelets very mildly elevated. We discussed her results and I have her a letter for work due to her starting muscle relaxer Flexeril. She is doing well, has no complaints today and did not want anything in addition to her lab results.   Jules Schick, DO [facility], PGY-3

## 2019-07-04 NOTE — Patient Instructions (Signed)
It was great to see you today! Thank you for letting me participate in your care!  Today, we discussed recent lab work all of which returned relatively normal, there were no concerning findings on your blood work.  Be well, Jules Schick, DO PGY-3, [facility]

## 2019-07-05 DIAGNOSIS — R739 Hyperglycemia, unspecified: Secondary | ICD-10-CM | POA: Insufficient documentation

## 2019-07-05 DIAGNOSIS — R519 Headache, unspecified: Secondary | ICD-10-CM | POA: Insufficient documentation

## 2019-07-05 NOTE — Assessment & Plan Note (Signed)
Uncontrolled 170 systolic, patient has headache with no neuro symptoms, will get BMP to follow-up on potential of kidney issues.  Advised to take antihypertensives and follow-up in a few days for recheck.

## 2019-07-05 NOTE — Assessment & Plan Note (Signed)
Worse headache than normal for the last 2 days, no concerning neuro symptoms, also with hypertension to 170 systolic.  Patient has had some back and neck pain for the last few days but there is no nuchal rigidity.  We will offer some Flexeril, advised to come back in a few days to discuss BMP and recheck blood pressure.

## 2019-07-05 NOTE — Assessment & Plan Note (Signed)
Incidental finding on BMP for hypertension, this was not fasting and so is possible this was incidental but at 180 given her weight and no A1c on record in the past 4 years is reasonable to check A1c.  Future lab order will be placed and patient will be told she can come back when she is able

## 2019-07-14 ENCOUNTER — Telehealth: Payer: Self-pay | Admitting: *Deleted

## 2019-07-14 NOTE — Telephone Encounter (Signed)
-----   Message from Marthenia Rolling, DO sent at 07/05/2019  2:22 PM EDT ----- White pool: I tried to reach patient to say their labs were fine with excpetion of some high blood sugar, they can get an A1c drawn when they have time to come by (already ordereD)

## 2019-07-14 NOTE — Telephone Encounter (Signed)
Tried to contact pt to inform her of below and to make her a lab visit but the mailbox was full so I could not LVM. If she calls back please assist her in getting a lab visit scheduled.[name], CMA

## 2019-08-04 NOTE — Telephone Encounter (Signed)
Patient returns call. I scheduled her for tomorrow at 415pm for A1c check.

## 2019-08-04 NOTE — Telephone Encounter (Signed)
LVM to call office to inform her of below and to assist in getting an appointment scheduled for lab.Fredrick Dray Zimmerman Rumple, CMA

## 2019-08-05 ENCOUNTER — Other Ambulatory Visit (INDEPENDENT_AMBULATORY_CARE_PROVIDER_SITE_OTHER): Payer: Self-pay

## 2019-08-05 ENCOUNTER — Other Ambulatory Visit: Payer: Self-pay

## 2019-08-05 DIAGNOSIS — R739 Hyperglycemia, unspecified: Secondary | ICD-10-CM

## 2019-08-05 LAB — POCT GLYCOSYLATED HEMOGLOBIN (HGB A1C): HbA1c, POC (controlled diabetic range): 7.8 % — AB (ref 0.0–7.0)

## 2019-08-07 ENCOUNTER — Other Ambulatory Visit: Payer: Self-pay | Admitting: Family Medicine

## 2019-08-07 ENCOUNTER — Telehealth: Payer: Self-pay

## 2019-08-07 DIAGNOSIS — R7303 Prediabetes: Secondary | ICD-10-CM

## 2019-08-07 MED ORDER — METFORMIN HCL ER 500 MG PO TB24: 500.0000 mg | ORAL_TABLET | Freq: Every day | ORAL | 0 refills | Status: DC

## 2019-08-07 NOTE — Telephone Encounter (Signed)
-----   Message from Marthenia Rolling, DO sent at 08/07/2019  9:23 AM EDT ----- (pool, please call and schedule f/u with Dr. Darin Engels)  Ma'am, your A1c test came back in the diabetic range.  While we tend to not diagnose on just one test in that range your last A1c was "pre"diabetic so it would be reasonable to talk about starting medication.  Since we know your kidney function is good, I can order some metformin for you to get you started but you need to come in and see Dr. Darin Engels to develop your long term plan.  This will take some work but this can be very manageable.

## 2019-08-07 NOTE — Telephone Encounter (Signed)
LVM for pt to call the office and schedule an appt with Dr. Darin Engels. If pt calls, please schedule an appt and also have a CMA or nurse give her the message below. Sunday Spillers, CMA

## 2019-08-11 NOTE — Telephone Encounter (Signed)
Patient calls nurse line returning call. Patient scheduled for 5/5 with PCP. The patient is planning on starting Metformin. I advised her it should be at her pharmacy.

## 2019-08-20 ENCOUNTER — Encounter: Payer: Self-pay | Admitting: Family Medicine

## 2019-08-20 ENCOUNTER — Ambulatory Visit (INDEPENDENT_AMBULATORY_CARE_PROVIDER_SITE_OTHER): Payer: Self-pay | Admitting: Family Medicine

## 2019-08-20 ENCOUNTER — Other Ambulatory Visit: Payer: Self-pay

## 2019-08-20 ENCOUNTER — Other Ambulatory Visit (HOSPITAL_COMMUNITY)
Admission: RE | Admit: 2019-08-20 | Discharge: 2019-08-20 | Disposition: A | Discharge status: Home / Self Care | Payer: Self-pay | Source: Ambulatory Visit | Attending: Family Medicine | Admitting: Family Medicine

## 2019-08-20 VITALS — BP 142/94 | HR 73 | Ht 65.0 in | Wt 308.8 lb

## 2019-08-20 DIAGNOSIS — Z124 Encounter for screening for malignant neoplasm of cervix: Secondary | ICD-10-CM

## 2019-08-20 DIAGNOSIS — I10 Essential (primary) hypertension: Secondary | ICD-10-CM

## 2019-08-20 DIAGNOSIS — Z0000 Encounter for general adult medical examination without abnormal findings: Secondary | ICD-10-CM

## 2019-08-20 DIAGNOSIS — E669 Obesity, unspecified: Secondary | ICD-10-CM

## 2019-08-20 DIAGNOSIS — E119 Type 2 diabetes mellitus without complications: Secondary | ICD-10-CM

## 2019-08-20 DIAGNOSIS — R739 Hyperglycemia, unspecified: Secondary | ICD-10-CM

## 2019-08-20 LAB — POCT UA - MICROALBUMIN
Creatinine, POC: 200 mg/dL
Microalbumin Ur, POC: 80 mg/L

## 2019-08-20 LAB — GLUCOSE, POCT (MANUAL RESULT ENTRY): POC Glucose: 130 mg/dl — AB (ref 70–99)

## 2019-08-20 MED ORDER — ACCU-CHEK SOFTCLIX LANCETS MISC: 12 refills | Status: DC

## 2019-08-20 MED ORDER — ACCU-CHEK AVIVA PLUS VI STRP: ORAL_STRIP | 12 refills | Status: DC

## 2019-08-20 MED ORDER — ACCU-CHEK SOFTCLIX LANCET DEV KIT: 1.0000 | PACK | Freq: Every day | 1 refills | Status: DC

## 2019-08-20 MED ORDER — ACCU-CHEK AVIVA DEVI: 0 refills | Status: DC

## 2019-08-20 NOTE — Progress Notes (Signed)
    SUBJECTIVE:   CHIEF COMPLAINT / HPI:   F/u A1C Patient presenting today for follow-up of A1c results.  A1c was elevated and physician at that time sent an Rx for Metformin.  Patient reports she has not taken this Metformin as she is trying to not take any medications.  States that she has significantly changed her diet and exercises daily.  She states she takes medications I would like to try as long as she can without them.  Family history is significant for paternal grandfather who had diabetes.  No history of gestational diabetes.  Overall feels well.  Fasting checks: no glucometer Post prandial no glucometer   Compliance: diet controlled  Diet: Balanced, greens, yogurt (has been looking at the back of the label and finds low carb and low sugar yogurt), baked chicken, nuts, boiled eggs, is trying to eat more small and frequent meals Exercise: Walks daily Eye exam: Due Foot exam: Will update today A1C: 7.8 Symptoms: No symptoms of hypoglycemia.  No symptoms of  polyuria, polydipsia.  No numbness in extremities, and no foot ulcers/trauma Meds: Metformin but is not taking Pneumonia vaccine: Due but due to insurance is not able to get in clinic Urine micro albumin:creatine ratio: Will obtain today  Monitoring Labs and Parameters Last A1C:  Lab Results  Component Value Date   HGBA1C 7.8 (A) 08/05/2019   Last Lipid:     Component Value Date/Time   CHOL 163 08/05/2015 1408   HDL 30 (L) 08/05/2015 1408   Last Bmet  Potassium  Date Value Ref Range Status  07/01/2019 3.8 3.5 - 5.2 mmol/L Final   Sodium  Date Value Ref Range Status  07/01/2019 138 134 - 144 mmol/L Final   Creat  Date Value Ref Range Status  08/05/2015 0.71 0.50 - 1.10 mg/dL Final   Creatinine, Ser  Date Value Ref Range Status  07/01/2019 0.77 0.57 - 1.00 mg/dL Final       PERTINENT  PMH / PSH: HTN, obesity  OBJECTIVE:   BP (!) 142/94   Pulse 73   Ht 5\' 5"  (1.651 m)   Wt (!) 308 lb 12.8 oz  (140.1 kg)   SpO2 99%   BMI 51.39 kg/m   Gen: awake and alert, NAD HEENT: Moist mucous membranes Cardio: RRR, no MRG Resp: CTAB, no wheezes, rales or rhonchi GI: soft, non tender, non distended, bowel sounds present Ext: no edema  ASSESSMENT/PLAN:   Diabetes mellitus without complication (HCC) Patient now with type 2 diabetes.  Is trying to do diet control only.  Shared decision making with patient.  Will discontinue Metformin at this time as patient is refusing to take it.  Will give Rx for glucometer as well as testing supplies so she may check her her sugars at home.  Advised pneumonia vaccine but due to insurance is not able to afford at this time.  Advised to follow-up in 2 weeks, at that time if she is doing well can consider continuing diet control.  If not having appropriate glycemic control will start medications.  HTN (hypertension) Can consider starting ACE inhibitor now that she has diabetes.  We will follow up the next ointment.  Obesity Advised healthy diet and daily exercise  Healthcare maintenance Will obtain Pap smear today.  Patient did not want STD testing at this time.     , DO [facility]

## 2019-08-20 NOTE — Patient Instructions (Signed)
  Diet Recommendations for Diabetes   Starchy (carb) foods: Bread, rice, pasta, potatoes, corn, cereal, grits, crackers, bagels, muffins, all baked goods.  (Fruits, milk, and yogurt also have carbohydrate, but most of these foods will not spike your blood sugar as the starchy foods will.)  A few fruits do cause high blood sugars; use small portions of bananas (limit to 1/2 at a time), grapes, watermelon, oranges, and most tropical fruits.    Protein foods: Meat, fish, poultry, eggs, dairy foods, and beans such as pinto and kidney beans (beans also provide carbohydrate).   1. Eat at least 3 meals and 1-2 snacks per day. Never go more than 4-5 hours while awake without eating. Eat breakfast within the first hour of getting up.   2. Limit starchy foods to TWO per meal and ONE per snack. ONE portion of a starchy  food is equal to the following:   - ONE slice of bread (or its equivalent, such as half of a hamburger bun).   - 1/2 cup of a "scoopable" starchy food such as potatoes or rice.   - 15 grams of carbohydrate as shown on food label.  3. Include at every meal: a protein food, a carb food, and vegetables and/or fruit.   - Obtain twice the volume of veg's as protein or carbohydrate foods for both lunch and dinner.   - Fresh or frozen veg's are best.   - Keep frozen veg's on hand for a quick vegetable serving.      

## 2019-08-21 ENCOUNTER — Other Ambulatory Visit: Payer: Self-pay | Admitting: Family Medicine

## 2019-08-21 DIAGNOSIS — R7303 Prediabetes: Secondary | ICD-10-CM

## 2019-08-21 DIAGNOSIS — E1165 Type 2 diabetes mellitus with hyperglycemia: Secondary | ICD-10-CM | POA: Insufficient documentation

## 2019-08-21 DIAGNOSIS — E119 Type 2 diabetes mellitus without complications: Secondary | ICD-10-CM | POA: Insufficient documentation

## 2019-08-21 NOTE — Assessment & Plan Note (Signed)
>>  ASSESSMENT AND PLAN FOR DIABETES MELLITUS WITHOUT COMPLICATION (HCC) WRITTEN ON 08/21/2019 12:11 PM BY ABRAHAM, SHERIN, DO  Patient now with type 2 diabetes.  Is trying to do diet control only.  Shared decision making with patient.  Will discontinue Metformin  at this time as patient is refusing to take it.  Will give Rx for glucometer as well as testing supplies so she may check her her sugars at home.  Advised pneumonia vaccine but due to insurance is not able to afford at this time.  Advised to follow-up in 2 weeks, at that time if she is doing well can consider continuing diet control.  If not having appropriate glycemic control will start medications.

## 2019-08-21 NOTE — Assessment & Plan Note (Signed)
Can consider starting ACE inhibitor now that she has diabetes.  We will follow up the next ointment.

## 2019-08-21 NOTE — Assessment & Plan Note (Signed)
Will obtain Pap smear today.  Patient did not want STD testing at this time.

## 2019-08-21 NOTE — Assessment & Plan Note (Signed)
Advised healthy diet and daily exercise 

## 2019-08-21 NOTE — Assessment & Plan Note (Signed)
Patient now with type 2 diabetes.  Is trying to do diet control only.  Shared decision making with patient.  Will discontinue Metformin at this time as patient is refusing to take it.  Will give Rx for glucometer as well as testing supplies so she may check her her sugars at home.  Advised pneumonia vaccine but due to insurance is not able to afford at this time.  Advised to follow-up in 2 weeks, at that time if she is doing well can consider continuing diet control.  If not having appropriate glycemic control will start medications.

## 2019-08-22 LAB — CYTOLOGY - PAP
Comment: NEGATIVE
Diagnosis: NEGATIVE
High risk HPV: NEGATIVE

## 2019-09-03 ENCOUNTER — Encounter: Payer: Self-pay | Admitting: Family Medicine

## 2019-09-03 ENCOUNTER — Other Ambulatory Visit: Payer: Self-pay

## 2019-09-03 ENCOUNTER — Ambulatory Visit (INDEPENDENT_AMBULATORY_CARE_PROVIDER_SITE_OTHER): Payer: Self-pay | Admitting: Family Medicine

## 2019-09-03 VITALS — BP 128/72 | HR 91 | Ht 65.5 in | Wt 304.6 lb

## 2019-09-03 DIAGNOSIS — E119 Type 2 diabetes mellitus without complications: Secondary | ICD-10-CM

## 2019-09-03 LAB — GLUCOSE, POCT (MANUAL RESULT ENTRY): POC Glucose: 156 mg/dl — AB (ref 70–99)

## 2019-09-03 NOTE — Assessment & Plan Note (Addendum)
Patient continues to states she would like to try diet and exercise alone.  States that she would like to avoid medications at all cost.  Has not been able to afford a glucometer but will obtain it this Friday. CBG in office 156. Has been working hard to change diet and exercise, has been losing weight.  Graduated patient on her lifestyle changes so far.  Patient is to schedule appointment with Dr. Gerilyn Pilgrim to discuss nutrition.  Patient is very motivated and still does not want any Metformin.  Shared decision making and patient will continue healthy diet and daily exercise.  Follow-up in 2 weeks at which time she will bring along with all her glucose checks.  If inadequate can consider starting Metformin.

## 2019-09-03 NOTE — Assessment & Plan Note (Signed)
>>  ASSESSMENT AND PLAN FOR DIABETES MELLITUS WITHOUT COMPLICATION (HCC) WRITTEN ON 09/03/2019 11:18 AM BY ABRAHAM, SHERIN, DO  Patient continues to states she would like to try diet and exercise alone.  States that she would like to avoid medications at all cost.  Has not been able to afford a glucometer but will obtain it this Friday. CBG in office 156. Has been working hard to change diet and exercise, has been losing weight.  Graduated patient on her lifestyle changes so far.  Patient is to schedule appointment with Dr. Wonda to discuss nutrition.  Patient is very motivated and still does not want any Metformin .  Shared decision making and patient will continue healthy diet and daily exercise.  Follow-up in 2 weeks at which time she will bring along with all her glucose checks.  If inadequate can consider starting Metformin .

## 2019-09-03 NOTE — Patient Instructions (Signed)
  Diet Recommendations for Diabetes   Starchy (carb) foods: Bread, rice, pasta, potatoes, corn, cereal, grits, crackers, bagels, muffins, all baked goods.  (Fruits, milk, and yogurt also have carbohydrate, but most of these foods will not spike your blood sugar as the starchy foods will.)  A few fruits do cause high blood sugars; use small portions of bananas (limit to 1/2 at a time), grapes, watermelon, oranges, and most tropical fruits.    Protein foods: Meat, fish, poultry, eggs, dairy foods, and beans such as pinto and kidney beans (beans also provide carbohydrate).   1. Eat at least 3 meals and 1-2 snacks per day. Never go more than 4-5 hours while awake without eating. Eat breakfast within the first hour of getting up.   2. Limit starchy foods to TWO per meal and ONE per snack. ONE portion of a starchy  food is equal to the following:   - ONE slice of bread (or its equivalent, such as half of a hamburger bun).   - 1/2 cup of a "scoopable" starchy food such as potatoes or rice.   - 15 grams of carbohydrate as shown on food label.  3. Include at every meal: a protein food, a carb food, and vegetables and/or fruit.   - Obtain twice the volume of veg's as protein or carbohydrate foods for both lunch and dinner.   - Fresh or frozen veg's are best.   - Keep frozen veg's on hand for a quick vegetable serving.      1. Please keep a log of your glucose readings, bring this with you to your follow up appointment in 2 weeks 2.  Continue to eat healthy and exercise daily. 3.  If your sugars are above 150 please call us, we have an after-hours emergency line for doctors on call 24/7.  Your sugars are less than 80 please give [place] a call as well.

## 2019-09-03 NOTE — Progress Notes (Signed)
    SUBJECTIVE:   CHIEF COMPLAINT / HPI:   Diabetes Recently seen on 5/5 for diabetes follow-up.  Patient at that time reported she would not like to be on Metformin and want to try diet and exercise alone.  Was advised to test her blood sugars and if under control can remain diet controlled.  If not controlled will need to start medications.  Patient reports she is doing "excellent". Exercising daily and keeps food log. Is doing more meal planning to plan ahead what she will eat. Has been looking up diabetic recipes. Is doing daily walking on treadmill and strength training at least twice a week. Has been losing weight which was one of her goals. Is planning on making an appointment with Dr. Gerilyn Pilgrim to discuss nutrition. Ate a yogurt and half a banana at 9:15AM.   Prior to diagnosis patient was urinating frequently 2/2 increased water intake. Was not waking up to urinate.   Fasting checks: does not have glucometer  Post prandial does not have glucometer   Compliance: working to improve diet and exercise  Diet: see above   Exercise: see above  Eye exam: due  Foot exam: UTD  A1C: 7.8 on 08/05/19 Symptoms: no symptoms of hypoglycemia. no symptoms of  polyuria, polydipsia. no numbness in extremities, and no foot ulcers/trauma Meds: desires diet control  Pneumonia vaccine: due  Urine micro albumin:creatine ratio: wnl on 08/20/19  Monitoring Labs and Parameters Last A1C:  Lab Results  Component Value Date   HGBA1C 7.8 (A) 08/05/2019   Last Lipid:     Component Value Date/Time   CHOL 163 08/05/2015 1408   HDL 30 (L) 08/05/2015 1408   Last Bmet  Potassium  Date Value Ref Range Status  07/01/2019 3.8 3.5 - 5.2 mmol/L Final   Sodium  Date Value Ref Range Status  07/01/2019 138 134 - 144 mmol/L Final   Creat  Date Value Ref Range Status  08/05/2015 0.71 0.50 - 1.10 mg/dL Final   Creatinine, Ser  Date Value Ref Range Status  07/01/2019 0.77 0.57 - 1.00 mg/dL Final       PERTINENT  PMH / PSH: obesity, HTN, T2DM  OBJECTIVE:   BP 128/72   Pulse 91   Ht 5' 5.5" (1.664 m)   Wt (!) 304 lb 9.6 oz (138.2 kg)   BMI 49.92 kg/m   Gen: awake and alert, NAD Cardio: RRR,  No MRG Resp: CTAB, no wheezes, rales or rhonchi GI: soft, non tender, non distended, bowel sounds present Ext: no edema   ASSESSMENT/PLAN:   Diabetes mellitus without complication (HCC) Patient continues to states she would like to try diet and exercise alone.  States that she would like to avoid medications at all cost.  Has not been able to afford a glucometer but will obtain it this Friday. CBG in office 156. Has been working hard to change diet and exercise, has been losing weight.  Graduated patient on her lifestyle changes so far.  Patient is to schedule appointment with Dr. Gerilyn Pilgrim to discuss nutrition.  Patient is very motivated and still does not want any Metformin.  Shared decision making and patient will continue healthy diet and daily exercise.  Follow-up in 2 weeks at which time she will bring along with all her glucose checks.  If inadequate can consider starting Metformin.      Oralia Manis, DO [facility]

## 2019-09-19 ENCOUNTER — Other Ambulatory Visit: Payer: Self-pay | Admitting: Family Medicine

## 2019-09-19 ENCOUNTER — Telehealth: Payer: Self-pay | Admitting: Family Medicine

## 2019-09-19 ENCOUNTER — Encounter: Payer: Self-pay | Admitting: Family Medicine

## 2019-09-19 ENCOUNTER — Other Ambulatory Visit: Payer: Self-pay

## 2019-09-19 DIAGNOSIS — I10 Essential (primary) hypertension: Secondary | ICD-10-CM

## 2019-09-19 NOTE — Progress Notes (Unsigned)
[facility] Telemedicine Visit  Patient consented to have virtual visit and was identified by name and date of birth. Method of visit: Video  Encounter participants: Patient: Holly [name] - located at home address  Provider: Nicki Guadalajara - located at [facility]  Others (if applicable): none   Chief Complaint: follow up for labs   HPI:  Patient recently diagnosed with T2DM  Denies  BG at home have ranged from   microalbumin could signify some early kidney disease  ROS: per HPI  Pertinent PMHx: ***  Exam:  There were no vitals taken for this visit.  Respiratory: ***  Assessment/Plan:  No problem-specific Assessment & Plan notes found for this encounter.    Time spent during visit with patient: *** minutes \

## 2019-09-30 ENCOUNTER — Other Ambulatory Visit: Payer: Self-pay

## 2019-09-30 ENCOUNTER — Telehealth (INDEPENDENT_AMBULATORY_CARE_PROVIDER_SITE_OTHER): Payer: Self-pay | Admitting: Student in an Organized Health Care Education/Training Program

## 2019-09-30 DIAGNOSIS — Z91199 Patient's noncompliance with other medical treatment and regimen due to unspecified reason: Secondary | ICD-10-CM

## 2019-09-30 DIAGNOSIS — Z5329 Procedure and treatment not carried out because of patient's decision for other reasons: Secondary | ICD-10-CM

## 2019-09-30 NOTE — Progress Notes (Signed)
[facility] Telemedicine Visit  1400- Attempted to call patient. Mailbox fall so could not leave voicemail. 1410- sent text message for appointment reminder 1500- sent another text for appointment. No response to several attempts to contact by myself and CMA. Patient will have to reschedule.

## 2019-09-30 NOTE — Progress Notes (Signed)
Attempted to reach pt to go over check in questions. No answer. Voicemail is full. Unable to leave message. Aquilla Solian, CMA

## 2019-10-27 ENCOUNTER — Ambulatory Visit (INDEPENDENT_AMBULATORY_CARE_PROVIDER_SITE_OTHER): Payer: No Typology Code available for payment source | Admitting: Family Medicine

## 2019-10-27 ENCOUNTER — Encounter: Payer: Self-pay | Admitting: Family Medicine

## 2019-10-27 ENCOUNTER — Other Ambulatory Visit: Payer: Self-pay

## 2019-10-27 VITALS — BP 140/98 | HR 78 | Ht 65.0 in | Wt 300.0 lb

## 2019-10-27 DIAGNOSIS — J302 Other seasonal allergic rhinitis: Secondary | ICD-10-CM | POA: Diagnosis not present

## 2019-10-27 DIAGNOSIS — I10 Essential (primary) hypertension: Secondary | ICD-10-CM

## 2019-10-27 DIAGNOSIS — L309 Dermatitis, unspecified: Secondary | ICD-10-CM | POA: Diagnosis not present

## 2019-10-27 MED ORDER — AMLODIPINE BESYLATE 5 MG PO TABS: 5.0000 mg | ORAL_TABLET | Freq: Every day | ORAL | 3 refills | Status: DC

## 2019-10-27 MED ORDER — TRIAMCINOLONE ACETONIDE 0.1 % EX LOTN: TOPICAL_LOTION | Freq: Two times a day (BID) | CUTANEOUS | Status: DC | PRN

## 2019-10-27 NOTE — Progress Notes (Signed)
    SUBJECTIVE:   CHIEF COMPLAINT / HPI:   Eczema flareup Patient reports that she started working out to help with her diabetes and had increased itchiness and scaly skin on her back which is why she initially made the appointment.  She stopped working out because she thought it was an eczema flare and the scaly itchy patch resolved.   Hypertension Patient's blood pressure today is 140/98.  She reports she has been out of her medication for 1 week.  Seasonal allergies Patient reports worsening seasonal allergies which she says that she takes Flonase for with little relief at this time.  Patient has not taken any oral antihistamines.     OBJECTIVE:   BP (!) 140/98   Pulse 78   Ht 5\' 5"  (1.651 m)   Wt 300 lb (136.1 kg)   SpO2 97%   BMI 49.92 kg/m   General: Well-appearing, no acute distress, sitting in chair next to exam table Respiratory: Normal work of breathing, lungs clear to auscultation bilaterally Cardiac: Regular rate and rhythm, no murmurs appreciated Abdomen: Soft, bowel sounds positive Skin: Patient has hyperpigmentation in middle of lower back.  Patient reports it was rough and scaly and itchy but has resolved since she stopped working out.  ASSESSMENT/PLAN:   HTN (hypertension) Patient's blood pressure today was 140/98.  She has been out of her antihypertensives for approximately 5 days. -Prescription for Norvasc sent to patient's pharmacy  Seasonal allergies Patient has had worsening seasonal allergies for which she takes Flonase daily with mild relief.  Patient has not tried any oral antihistamines. -Recommended over-the-counter Zyrtec daily  Eczema Patient reports eczema in her low back area which worsened with exercise.  She said that her skin became rough and itchy but since she stopped working out it has resolved.  Patient would like to continue working out so is asking for treatment in case she starts to break out or have an eczema flare  again. -Triamcinolone 0.1% lotion prescribed -Strict return precautions given regarding if patient notice any signs or symptoms of infection she should be seen for evaluation     , MD [facility]

## 2019-10-27 NOTE — Assessment & Plan Note (Signed)
Patient's blood pressure today was 140/98.  She has been out of her antihypertensives for approximately 5 days. -Prescription for Norvasc sent to patient's pharmacy

## 2019-10-27 NOTE — Assessment & Plan Note (Signed)
Patient reports eczema in her low back area which worsened with exercise.  She said that her skin became rough and itchy but since she stopped working out it has resolved.  Patient would like to continue working out so is asking for treatment in case she starts to break out or have an eczema flare again. -Triamcinolone 0.1% lotion prescribed -Strict return precautions given regarding if patient notice any signs or symptoms of infection she should be seen for evaluation

## 2019-10-27 NOTE — Patient Instructions (Addendum)
It was a pleasure to meet you today!  I am glad that your eczema is cleared up but I will send a prescription for triamcinolone 0.1% lotion to your pharmacy.  Regarding your blood pressure medications I have sent prescriptions for your Norvasc to your pharmacy.  With regards to your diabetes keep up the good work with the exercise and dietary changes.  I would like for you to come back in a few weeks for a diabetic recheck so they can check your hemoglobin A1c and determine the next steps in management.  If you have any questions or concerns please feel free to call the clinic.  I hope you have a wonderful afternoon!  Triamcinolone skin cream, ointment, lotion, or aerosol What is this medicine? TRIAMCINOLONE (trye am SIN oh lone) is a corticosteroid. It is used on the skin to reduce swelling, redness, itching, and allergic reactions. This medicine may be used for other purposes; ask your health care provider or pharmacist if you have questions. COMMON BRAND NAME(S): Aristocort, Aristocort A, Aristocort HP, Cinalog, Cinolar, DERMASORB TA Complete, Flutex, Kenalog, Pediaderm TA, Sila III, SP Rx 228, Triacet, Trianex, Triderm What should I tell my health care provider before I take this medicine? They need to know if you have any of these conditions:  any active infection  large areas of burned or damaged skin  skin wasting or thinning  an unusual or allergic reaction to triamcinolone, corticosteroids, other medicines, foods, dyes, or preservatives  pregnant or trying to get pregnant  breast-feeding How should I use this medicine? This medicine is for external use only. Do not take by mouth. Follow the directions on the prescription label. Wash your hands before and after use. Apply a thin film of medicine to the affected area. Do not cover with a bandage or dressing unless your doctor or health care professional tells you to. Do not use on healthy skin or over large areas of skin. Do not get this  medicine in your eyes. If you do, rinse out with plenty of cool tap water. It is important not to use more medicine than prescribed. Do not use your medicine more often than directed. Talk to your pediatrician regarding the use of this medicine in children. Special care may be needed. Elderly patients are more likely to have damaged skin through aging, and this may increase side effects. This medicine should only be used for brief periods and infrequently in older patients. Overdosage: If you think you have taken too much of this medicine contact a poison control center or emergency room at once. NOTE: This medicine is only for you. Do not share this medicine with others. What if I miss a dose? If you miss a dose, use it as soon as you can. If it is almost time for your next dose, use only that dose. Do not use double or extra doses. What may interact with this medicine? Interactions are not expected. This list may not describe all possible interactions. Give your health care provider a list of all the medicines, herbs, non-prescription drugs, or dietary supplements you use. Also tell them if you smoke, drink alcohol, or use illegal drugs. Some items may interact with your medicine. What should I watch for while using this medicine? Tell your doctor or health care professional if your symptoms do not start to get better within one week. Do not use for more than 14 days. Do not use on healthy skin or over large areas of skin. Tell  your doctor or health care professional if you are exposed to anyone with measles or chickenpox, or if you develop sores or blisters that do not heal properly. Do not use an airtight bandage to cover the affected area unless your doctor or health care professional tells you to. If you are to cover the area, follow the instructions carefully. Covering the area where the medicine is applied can increase the amount that passes through the skin and increases the risk of side  effects. If treating the diaper area of a child, avoid covering the treated area with tight-fitting diapers or plastic pants. This may increase the amount of medicine that passes through the skin and increase the risk of serious side effects. What side effects may I notice from receiving this medicine? Side effects that you should report to your doctor or health care professional as soon as possible:  burning or itching of the skin  dark red spots on the skin  infection  painful, red, pus filled blisters in hair follicles  thinning of the skin, sunburn more likely especially on the face Side effects that usually do not require medical attention (report to your doctor or health care professional if they continue or are bothersome):  dry skin, irritation  unusual increased growth of hair on the face or body This list may not describe all possible side effects. Call your doctor for medical advice about side effects. You may report side effects to FDA at 1-800-FDA-1088. Where should I keep my medicine? Keep out of the reach of children. Store at room temperature between 15 and 30 degrees C (59 and 86 degrees F). Do not freeze. Throw away any unused medicine after the expiration date. NOTE: This sheet is a summary. It may not cover all possible information. If you have questions about this medicine, talk to your doctor, pharmacist, or health care provider.  2020 Elsevier/Gold Standard (2018-01-03 10:50:30)

## 2019-10-27 NOTE — Assessment & Plan Note (Addendum)
Patient has had worsening seasonal allergies for which she takes Flonase daily with mild relief.  Patient has not tried any oral antihistamines. -Recommended over-the-counter Zyrtec daily

## 2019-11-14 ENCOUNTER — Ambulatory Visit: Payer: No Typology Code available for payment source | Admitting: Family Medicine

## 2019-11-14 ENCOUNTER — Other Ambulatory Visit: Payer: Self-pay

## 2019-11-17 ENCOUNTER — Other Ambulatory Visit: Payer: Self-pay

## 2019-11-20 NOTE — Telephone Encounter (Signed)
LVM for patient to call and schedule appointment.  .Holly [name] R Jamiah Recore, CMA  

## 2019-11-21 ENCOUNTER — Other Ambulatory Visit: Payer: Self-pay

## 2019-11-21 ENCOUNTER — Encounter: Payer: Self-pay | Admitting: Family Medicine

## 2019-11-21 ENCOUNTER — Ambulatory Visit (INDEPENDENT_AMBULATORY_CARE_PROVIDER_SITE_OTHER): Payer: No Typology Code available for payment source | Admitting: Family Medicine

## 2019-11-21 VITALS — BP 160/100 | HR 99 | Wt 302.5 lb

## 2019-11-21 DIAGNOSIS — Z975 Presence of (intrauterine) contraceptive device: Secondary | ICD-10-CM | POA: Diagnosis not present

## 2019-11-21 DIAGNOSIS — Z3049 Encounter for surveillance of other contraceptives: Secondary | ICD-10-CM

## 2019-11-21 DIAGNOSIS — E119 Type 2 diabetes mellitus without complications: Secondary | ICD-10-CM | POA: Diagnosis not present

## 2019-11-21 DIAGNOSIS — I10 Essential (primary) hypertension: Secondary | ICD-10-CM

## 2019-11-21 LAB — POCT GLYCOSYLATED HEMOGLOBIN (HGB A1C): HbA1c, POC (prediabetic range): 6.4 % (ref 5.7–6.4)

## 2019-11-21 LAB — POCT URINE PREGNANCY: Preg Test, Ur: NEGATIVE

## 2019-11-21 MED ORDER — HYDROCHLOROTHIAZIDE 25 MG PO TABS: 25.0000 mg | ORAL_TABLET | Freq: Every day | ORAL | 3 refills | Status: DC

## 2019-11-21 NOTE — Addendum Note (Signed)
Addended by: Sabino Dick on: 11/21/2019 06:08 PM   Modules accepted: Level of Service

## 2019-11-21 NOTE — Assessment & Plan Note (Signed)
Patient BP 160/100 today. Is taking Norvasc but needs refill for HCTZ 25 mg. BP was also taken after BP walked and sat in the room. I will schedule her for a follow up visit to recheck BP and adjust medications if needed. She states that her BP at home typically range 120/80, never more than 130 systolic.

## 2019-11-21 NOTE — Progress Notes (Signed)
SUBJECTIVE:   CHIEF COMPLAINT / HPI:  IUD removal and insertion, blood pressure medication refill, weight loss.  IUD Removal/Insertion Mirena inserted 7 years ago after the birth of her son. She is not sexually active. No concern for STI's. Did not have complications with prior IUD insertion and did not note much spotting afterwards. She states the primary reason for IUD is to help with heavy periods. Would like another Mirena IUD today.   Blood Pressure Patient states that on her last visit she did receive a refill on one of her BP medications but not her HCTZ. She takes her BP daily, usually in the afternoon. Typically ranges around 120/80, states systolic pressures are never greater than 130.  Weight Loss Patient tells me that she has been working very hard on weight loss. She watches her diet closely and exercises twice a day for 5 days out of the week. Additionally, she stays very hydrated drinking 1 to 2 L of water a day. She weighs herself daily and feels some frustration and concern when not seeing progress daily. She is wondering if there is anything else she should be doing since she has noticed a plateau.   PERTINENT  PMH / PSH:  Past Medical History:  Diagnosis Date  . Eczema    Hx: of  . Pregnancy Diabetes   . Pregnancy induced hypertension   . Seasonal allergies    Hx: of     OBJECTIVE:   BP (!) 160/100   Pulse 99   Wt (!) 302 lb 8 oz (137.2 kg)   SpO2 99%   BMI 50.34 kg/m   Physical Exam Constitutional:      Appearance: Normal appearance.  HENT:     Head: Normocephalic.  Eyes:     Extraocular Movements: Extraocular movements intact.  Cardiovascular:     Rate and Rhythm: Normal rate and regular rhythm.  Pulmonary:     Effort: No respiratory distress.     Breath sounds: Normal breath sounds. No stridor. No wheezing or rhonchi.  Neurological:     Mental Status: She is alert.     ASSESSMENT/PLAN:   IUD (intrauterine device) in place Patient here  today for IUD (Mirena) removal and insertion. Has had IUD in place since her son was born, 7 years ago. Not sexually active since giving birth. Negative UPreg today. States reason for IUD is for previous history of heavy periods. No complications with prior insertion.   Today, I inserted new Mirena IUD after discussing risks and benefits with patient and receiving consent. Precepted by Dr. Pollie Meyer who assisted me in the procedure. Discussed return precautions such as severe pain and heavy bleeding. Informed that cramping and spotting are typical after procedure.  HTN (hypertension) Patient BP 160/100 today. Is taking Norvasc but needs refill for HCTZ 25 mg. BP was also taken after BP walked and sat in the room. I will schedule her for a follow up visit to recheck BP and adjust medications if needed. She states that her BP at home typically range 120/80, never more than 130 systolic.  Obesity Patient is working diligently on weight loss by exercising twice 5days/week and monitoring her diet closely. Discussed not weighing on a scale every day due to the discouragement it can cause by not seeing immediate results. Discussed taking pictures semi-monthly to monthly or measuring arms or waist to look for improvements. Congratulated patient on her work towards these goals and encouraged her to keep it up.  Sharion Settler, Add[place]

## 2019-11-21 NOTE — Assessment & Plan Note (Signed)
Patient is working diligently on weight loss by exercising twice 5days/week and monitoring her diet closely. Discussed not weighing on a scale every day due to the discouragement it can cause by not seeing immediate results. Discussed taking pictures semi-monthly to monthly or measuring arms or waist to look for improvements. Congratulated patient on her work towards these goals and encouraged her to keep it up.

## 2019-11-21 NOTE — Patient Instructions (Addendum)
It was wonderful to see you today. I'm so pleased to hear about your motivation and determination towards a healthier lifestyle, please keep it up!  Please bring ALL of your medications with you to every visit.   Today we:  1. Replaced your IUD. You may notice cramping and spotting, that is normal. You may take Tylenol for the cramping if needed.  2. Your blood pressure. It was elevated today. I have refilled your BP medication. I believe we will see great improvements in your pressures with continued diet and exercise.   3. We discussed your weight loss journey. Please don't be discouraged by the scale. Good ways to measure include measurements of waist and arms as well as pictures. Try doing this only twice a month or monthly. The results will come.   4. We also rechecked your A1c to assess your diabetes. I will either call you or send a letter with your results.  Thank you for choosing [facility].   Please call [phone] with any questions about today's appointment.  Please be sure to schedule follow up at the front  desk before you leave today.   Sabino Dick, DO PGY-1 Family Medicine   IUD PLACEMENT POST-PROCEDURE INSTRUCTIONS  1. You may take Ibuprofen, Aleve or Tylenol for pain if needed.  Cramping should resolve within in 24 hours.  2. You may have a small amount of spotting.  You should wear a mini pad for the next few days.  3. You may have intercourse after 24 hours.  If you using this for birth control, it is effective immediately.  4. You need to call if you have any pelvic pain, fever, heavy bleeding or foul smelling vaginal discharge.  Irregular bleeding is common the first several months after having an IUD placed. You do not need to call for this reason unless you are concerned.  5. Shower or bathe as normal  You should have a follow-up appointment in 4-8 weeks for a re-check to make sure you are not having any problems.

## 2019-11-21 NOTE — Progress Notes (Signed)
uri

## 2019-11-21 NOTE — Assessment & Plan Note (Signed)
Patient here today for IUD (Mirena) removal and insertion. Has had IUD in place since her son was born, 7 years ago. Not sexually active since giving birth. Negative UPreg today. States reason for IUD is for previous history of heavy periods. No complications with prior insertion.   Today, I inserted new Mirena IUD after discussing risks and benefits with patient and receiving consent. Precepted by Dr. Pollie Meyer who assisted me in the procedure. Discussed return precautions such as severe pain and heavy bleeding. Informed that cramping and spotting are typical after procedure.

## 2019-11-28 MED ORDER — LEVONORGESTREL 20 MCG/24HR IU IUD
1.0000 | INTRAUTERINE_SYSTEM | Freq: Once | INTRAUTERINE | Status: AC
  Administered 2019-11-21: 1 via INTRAUTERINE

## 2019-11-28 NOTE — Addendum Note (Signed)
Addended by: Veronda Prude on: 11/28/2019 08:45 AM   Modules accepted: Orders

## 2019-12-25 ENCOUNTER — Ambulatory Visit (INDEPENDENT_AMBULATORY_CARE_PROVIDER_SITE_OTHER): Payer: No Typology Code available for payment source | Admitting: Family Medicine

## 2019-12-25 ENCOUNTER — Other Ambulatory Visit: Payer: Self-pay

## 2019-12-25 ENCOUNTER — Encounter: Payer: Self-pay | Admitting: Family Medicine

## 2019-12-25 VITALS — BP 144/80 | HR 78 | Ht 65.0 in | Wt 302.4 lb

## 2019-12-25 DIAGNOSIS — I10 Essential (primary) hypertension: Secondary | ICD-10-CM

## 2019-12-25 DIAGNOSIS — Z23 Encounter for immunization: Secondary | ICD-10-CM | POA: Diagnosis not present

## 2019-12-25 NOTE — Progress Notes (Signed)
° ° °  SUBJECTIVE:   CHIEF COMPLAINT / HPI: Blood pressure f/u  Blood Pressure F/U: Patient presents for follow up on her blood pressures. States that her pressures are usually 120/84 to sometimes 128/90. She is still working adamantly on her diet and exercise but has noticed a plateau. No other symptoms of concerns. Is interested in Flu vaccination.   PERTINENT  PMH / PSH: HTN, pre-diabetic, asthma  OBJECTIVE:   BP (!) 144/80    Pulse 78    Ht 5\' 5"  (1.651 m)    Wt (!) 302 lb 6.4 oz (137.2 kg)    SpO2 98%    BMI 50.32 kg/m   Gen: pleasant, no acute distress Heart: distant heart sounds, regular rate and rhythm Lungs: clear to ausculation b/l   ASSESSMENT/PLAN:   HTN (hypertension) Patient with elevated pressure to 140's systolic today. Improved from last visit BP that was 160/100. At prior visit patient had needed a refill on her HCTZ. She now has both medications and took them both this morning. Since her home blood pressures are in the 120's systolic and patient continues to work hard on her diet and exercise, I will not adjust her medications at this time. I will plan to see her again in one month, if they continue to be elevated at that time we can discuss adjustments. Patient also educated on starting Metformin to help slow progression of diabetes and to help with weight loss. She would like to continue to work on exercise and diet first before starting a medication.    Flu Vaccine given at this appointment.  , DO [place] [facility]

## 2019-12-25 NOTE — Assessment & Plan Note (Signed)
Patient with elevated pressure to 140's systolic today. Improved from last visit BP that was 160/100. At prior visit patient had needed a refill on her HCTZ. She now has both medications and took them both this morning. Since her home blood pressures are in the 120's systolic and patient continues to work hard on her diet and exercise, I will not adjust her medications at this time. I will plan to see her again in one month, if they continue to be elevated at that time we can discuss adjustments. Patient also educated on starting Metformin to help slow progression of diabetes and to help with weight loss. She would like to continue to work on exercise and diet first before starting a medication.

## 2019-12-25 NOTE — Patient Instructions (Addendum)
It was wonderful to see you today.  Please bring ALL of your medications with you to every visit.   Today we talked about:  Your blood pressure. It was still elevated in the office today to 144/80. I am reassured that your home blood pressures are better so we will not adjust your medications at this time. I will plan to see you back in 1 month. If your blood pressures continue to be elevated, we can discuss medication adjustments at that time. It would also be helpful if you brought in a log of your blood pressures when you take them. This will help to give me a better idea of what they are normally for you.   We discussed the possibility of trialing Metformin to help slow the progression to diabetes and potentially aid with weight loss as well. I think it is reasonable to continue to exercise and improve your diet before we start this medication. We can talk about it further at your next appointment if you decide to change your mind.   Thank you for choosing [facility].   Please call [phone] with any questions about today's appointment.  Please be sure to schedule follow up for 1 month at the front  desk before you leave today.   It was great to see you today!   Take good care,  Sabino Dick, DO PGY-1 Family Medicine

## 2020-01-08 ENCOUNTER — Other Ambulatory Visit: Payer: Self-pay

## 2020-01-08 DIAGNOSIS — E119 Type 2 diabetes mellitus without complications: Secondary | ICD-10-CM

## 2020-01-08 MED ORDER — ACCU-CHEK SOFTCLIX LANCETS MISC: 12 refills | Status: DC

## 2020-01-09 ENCOUNTER — Other Ambulatory Visit: Payer: Self-pay

## 2020-01-09 ENCOUNTER — Other Ambulatory Visit: Payer: Self-pay | Admitting: Family Medicine

## 2020-01-09 DIAGNOSIS — E119 Type 2 diabetes mellitus without complications: Secondary | ICD-10-CM

## 2020-01-09 MED ORDER — ACCU-CHEK AVIVA DEVI: 0 refills | Status: DC

## 2020-01-12 ENCOUNTER — Other Ambulatory Visit: Payer: Self-pay | Admitting: Family Medicine

## 2020-01-12 MED ORDER — ACCU-CHEK GUIDE VI STRP: ORAL_STRIP | 12 refills | Status: DC

## 2020-01-12 MED ORDER — ACCU-CHEK GUIDE W/DEVICE KIT
PACK | 0 refills | Status: DC
Start: 2020-01-12 — End: 2020-08-05

## 2020-01-28 ENCOUNTER — Ambulatory Visit: Payer: No Typology Code available for payment source | Admitting: Family Medicine

## 2020-02-18 ENCOUNTER — Encounter: Payer: Self-pay | Admitting: Family Medicine

## 2020-02-18 ENCOUNTER — Other Ambulatory Visit: Payer: Self-pay

## 2020-02-18 ENCOUNTER — Ambulatory Visit (INDEPENDENT_AMBULATORY_CARE_PROVIDER_SITE_OTHER): Payer: No Typology Code available for payment source | Admitting: Family Medicine

## 2020-02-18 DIAGNOSIS — M79674 Pain in right toe(s): Secondary | ICD-10-CM | POA: Insufficient documentation

## 2020-02-18 DIAGNOSIS — Z6841 Body Mass Index (BMI) 40.0 and over, adult: Secondary | ICD-10-CM

## 2020-02-18 DIAGNOSIS — I10 Essential (primary) hypertension: Secondary | ICD-10-CM

## 2020-02-18 DIAGNOSIS — G5603 Carpal tunnel syndrome, bilateral upper limbs: Secondary | ICD-10-CM | POA: Diagnosis not present

## 2020-02-18 MED ORDER — HYDROCHLOROTHIAZIDE 25 MG PO TABS: 25.0000 mg | ORAL_TABLET | Freq: Every day | ORAL | 3 refills | Status: DC

## 2020-02-18 NOTE — Progress Notes (Signed)
SUBJECTIVE:   CHIEF COMPLAINT / HPI:    Hypertension Follow Up Patient presents for follow-up on her high blood pressure.  States that she has only been taking her amlodipine.  States that she does not have her hydrochlorothiazide as she has been unable to get this from the pharmacy.  Her home blood pressures have been ranging in the high 120s to 130s with diastolics ranging 90-100.  Patient has had intermittent headaches with lightheadedness.  These resolve on their own with time.  She denies dizziness, vision changes (except one time that she believes was attributed to staring at a screen for too long), chest pain shortness of breath or palpitations.  Wrist Pain Patient states that for the last 3 to 4 weeks she has had worsening bilateral wrist pain.  Her right is worse than her left.  She is right-handed she notes similar symptoms in the past when her fingers would "lock up".  Now they do not lock up but she has pain that shoots to her fingers down to her wrist and up her arm.  She has been doing wrist exercises.  She wears a right wrist brace during the day occasionally.  She has not tried medications for this.  She does note some weakness and pain.  Denies tingling and numbness.  Toe nail pain Patient states that about 3 to 4 weeks ago she received a pedicure.  States the nail technician removed an ingrown toenail from her right first digit.  Since that time she has had pain at the lateral aspect of her right first toe.  Does note some redness and a little swelling.  The redness is not spreading.  She has soaked her toe, used peroxide and cut her nail back in an attempt to alleviate the pain.  She takes Tylenol only when she really needs it, otherwise she prefers to not take medications.  No fevers or drainage.  The pain has improved slightly but it is exacerbated with palpation to the area.  Weight Loss Patient briefly mentioned at the end of the visit that she is interested in other tips  for weight loss.  She continues with her diet and exercise but has not seen a change in her weight.  Would be interesting in trialing medication for this.  PERTINENT  PMH / PSH:  Past Medical History:  Diagnosis Date  . Eczema    Hx: of  . Pregnancy   . Pregnancy induced hypertension   . Seasonal allergies    Hx: of     OBJECTIVE:   BP (!) 160/100   Pulse 86   Ht 5\' 5"  (1.651 m)   Wt (!) 306 lb 2 oz (138.9 kg)   SpO2 99%   BMI 50.94 kg/m   Physical Exam Constitutional:      Appearance: Normal appearance. She is obese.  Cardiovascular:     Rate and Rhythm: Normal rate.     Pulses: Normal pulses.     Heart sounds: Normal heart sounds.  Pulmonary:     Effort: Pulmonary effort is normal.     Breath sounds: Normal breath sounds.  Musculoskeletal:        General: No swelling or deformity. Normal range of motion.     Comments: Negative Tinel's, Phalen's and reverse Phalen's, b/l.   Skin:    General: Skin is warm and dry.     Comments: Right 1st digit not erythematous, not warm and without obvious swelling. Nail deformity noted where patient  cut her nail. Appreciable hard and tender area to lateral right upper digit.   Neurological:     General: No focal deficit present.     Mental Status: She is alert.        ASSESSMENT/PLAN:   HTN (hypertension) Patient with a blood pressure of 160/100 on initial presentation.  Recheck was slightly improved to 150/92.  Has previously been on amlodipine 5 mg and hydrochlorothiazide 25 mg.  For the last several months she has not had her hydrochlorothiazide.  Her home blood pressures always seem to be better than her office blood pressures which lead me to suspect some component of possible whitecoat syndrome.  I have refilled patient's hydrochlorothiazide and advised her to please let me/the clinic know if she has any issues in refilling this.  I do believe that patient will need to continue on dual medication therapy for this.  She has  had a history of diabetes in the past now prediabetic with A1c of 6.4% can consider adding ARB in the future for renal protection.  Would like patient to follow-up in 2 weeks for recheck on her blood pressure.  Have advised her to keep a log of her blood pressures to get a better sense of her baseline.  Carpal tunnel syndrome, bilateral Patient with bilateral wrist pain with radiation to her fingers and arm.  Worse in her dominant hand.  Also complaining now of difficulty with opening and closing bottles.  Negative Tinel's, reverse Tinel's and Phalen's on exam though her story is most consistent with carpal tunnel.  Differential can also include ulnar nerve entrapment, C6 or C7 cervical radiculopathy or tendinopathy.  She does not complain of any upper arm or neck pain to suggest radiculopathy.  Pain appears to be more in distribution of median nerve.  Will trial with night splints and follow-up in 2 weeks to monitor for any changes.  Toe pain, right Patient with several weeks of right toe pain status post pedicure with removal of ingrown toenail.  She has tried soaking, peroxide and trimming her nail.  No signs to suggest infection.  Toe is well perfused, not erythematous, not swollen, not warm.  No visible abscess.  No drainage.  Area is very dry with a slight crack in skin close to the upper right lateral area of possible ingrown/callus (picture above).  Have advised patient to keep area lubricated with Vaseline.  She can take a Tylenol for the pain as needed though she prefers to not take medications.  Also advised patient to not trim her nail down so short.  Unfortunate I do believe that this will take several weeks to heal/re-grow.  Obesity Patient has a history of diabetes though is now in the prediabetic range with an A1c is 6.4% in August.  She has been very diligent about exercising and watching her diet though has not seen improvement in her weight.  She would benefit from a GLP-1 agonist lost  and diabetes.  She is agreeable to this and is aware that it may be costly.  Will proceed in prescribing.     Sabino Dick, DO [facility]

## 2020-02-18 NOTE — Assessment & Plan Note (Signed)
Patient with a blood pressure of 160/100 on initial presentation.  Recheck was slightly improved to 150/92.  Has previously been on amlodipine 5 mg and hydrochlorothiazide 25 mg.  For the last several months she has not had her hydrochlorothiazide.  Her home blood pressures always seem to be better than her office blood pressures which lead me to suspect some component of possible whitecoat syndrome.  I have refilled patient's hydrochlorothiazide and advised her to please let me/the clinic know if she has any issues in refilling this.  I do believe that patient will need to continue on dual medication therapy for this.  She has had a history of diabetes in the past now prediabetic with A1c of 6.4% can consider adding ARB in the future for renal protection.  Would like patient to follow-up in 2 weeks for recheck on her blood pressure.  Have advised her to keep a log of her blood pressures to get a better sense of her baseline.

## 2020-02-18 NOTE — Assessment & Plan Note (Signed)
Patient has a history of diabetes though is now in the prediabetic range with an A1c is 6.4% in August.  She has been very diligent about exercising and watching her diet though has not seen improvement in her weight.  She would benefit from a GLP-1 agonist lost and diabetes.  She is agreeable to this and is aware that it may be costly.  Will proceed in prescribing.

## 2020-02-18 NOTE — Patient Instructions (Signed)
It was wonderful to see you today.  Please bring ALL of your medications with you to every visit.   Today we talked about:  Your high blood pressure.  I have sent in a refill of your hydrochlorothiazide to your pharmacy.  Please let me know if there are any issues.  Continue to take your blood pressure daily and keep a log.  I would like to see you back in a couple weeks for a recheck.  Please bring your log at that time.  We discussed your wrist pain.  I think this is likely carpal tunnel.  You can use wrist splints at night. I have also attached some more information about this below. We can follow-up in a couple weeks.   For your toe, use Vaseline to the area to keep it hydrated.  Ensure that your shoes are fitting correctly and not rubbing up against the area.   Thank you for choosing [facility].   Please call [phone] with any questions about today's appointment.  Please be sure to schedule follow up at the front  desk before you leave today.   Sabino Dick, DO PGY-1 Family Medicine    Carpal Tunnel Syndrome  Carpal tunnel syndrome is a condition that causes pain in your hand and arm. The carpal tunnel is a narrow area that is on the palm side of your wrist. Repeated wrist motion or certain diseases may cause swelling in the tunnel. This swelling can pinch the main nerve in the wrist (median nerve). What are the causes? This condition may be caused by:  Repeated wrist motions.  Wrist injuries.  Arthritis.  A sac of fluid (cyst) or abnormal growth (tumor) in the carpal tunnel.  Fluid buildup during pregnancy. Sometimes the cause is not known. What increases the risk? The following factors may make you more likely to develop this condition:  Having a job in which you move your wrist in the same way many times. This includes jobs like being a Midwife or a Conservation officer, nature.  Being a woman.  Having other health conditions, such  as: ? Diabetes. ? Obesity. ? A thyroid gland that is not active enough (hypothyroidism). ? Kidney failure. What are the signs or symptoms? Symptoms of this condition include:  A tingling feeling in your fingers.  Tingling or a loss of feeling (numbness) in your hand.  Pain in your entire arm. This pain may get worse when you bend your wrist and elbow for a long time.  Pain in your wrist that goes up your arm to your shoulder.  Pain that goes down into your palm or fingers.  A weak feeling in your hands. You may find it hard to grab and hold items. You may feel worse at night. How is this diagnosed? This condition is diagnosed with a medical history and physical exam. You may also have tests, such as:  Electromyogram (EMG). This test checks the signals that the nerves send to the muscles.  Nerve conduction study. This test checks how well signals pass through your nerves.  Imaging tests, such as X-rays, ultrasound, and MRI. These tests check for what might be the cause of your condition. How is this treated? This condition may be treated with:  Lifestyle changes. You will be asked to stop or change the activity that caused your problem.  Doing exercise and activities that make bones and muscles stronger (physical therapy).  Learning how to use your hand again (occupational therapy).  Medicines for  pain and swelling (inflammation). You may have injections in your wrist.  A wrist splint.  Surgery. Follow these instructions at home: If you have a splint:  Wear the splint as told by your doctor. Remove it only as told by your doctor.  Loosen the splint if your fingers: ? Tingle. ? Lose feeling (become numb). ? Turn cold and blue.  Keep the splint clean.  If the splint is not waterproof: ? Do not let it get wet. ? Cover it with a watertight covering when you take a bath or a shower. Managing pain, stiffness, and swelling   If told, put ice on the painful  area: ? If you have a removable splint, remove it as told by your doctor. ? Put ice in a plastic bag. ? Place a towel between your skin and the bag. ? Leave the ice on for 20 minutes, 2-3 times per day. General instructions  Take over-the-counter and prescription medicines only as told by your doctor.  Rest your wrist from any activity that may cause pain. If needed, talk with your boss at work about changes that can help your wrist heal.  Do any exercises as told by your doctor, physical therapist, or occupational therapist.  Keep all follow-up visits as told by your doctor. This is important. Contact a doctor if:  You have new symptoms.  Medicine does not help your pain.  Your symptoms get worse. Get help right away if:  You have very bad numbness or tingling in your wrist or hand. Summary  Carpal tunnel syndrome is a condition that causes pain in your hand and arm.  It is often caused by repeated wrist motions.  Lifestyle changes and medicines are used to treat this problem. Surgery may help in very bad cases.  Follow your doctor's instructions about wearing a splint, resting your wrist, keeping follow-up visits, and calling for help. This information is not intended to replace advice given to you by your health care provider. Make sure you discuss any questions you have with your health care provider. Document Revised: 08/10/2017 Document Reviewed: 08/10/2017 Elsevier Patient Education  2020 [employer].

## 2020-02-18 NOTE — Assessment & Plan Note (Addendum)
Patient with several weeks of right toe pain status post pedicure with removal of ingrown toenail.  She has tried soaking, peroxide and trimming her nail.  No signs to suggest infection.  Toe is well perfused, not erythematous, not swollen, not warm.  No visible abscess.  No drainage.  Area is very dry with a slight crack in skin close to the upper right lateral area of possible ingrown/callus (picture above).  Have advised patient to keep area lubricated with Vaseline.  She can take a Tylenol for the pain as needed though she prefers to not take medications.  Also advised patient to not trim her nail down so short.  Unfortunate I do believe that this will take several weeks to heal/re-grow.

## 2020-02-18 NOTE — Assessment & Plan Note (Addendum)
Patient with bilateral wrist pain with radiation to her fingers and arm.  Worse in her dominant hand.  Also complaining now of difficulty with opening and closing bottles.  Negative Tinel's, reverse Tinel's and Phalen's on exam though her story is most consistent with carpal tunnel.  Differential can also include ulnar nerve entrapment, C6 or C7 cervical radiculopathy or tendinopathy.  She does not complain of any upper arm or neck pain to suggest radiculopathy.  Pain appears to be more in distribution of median nerve.  Will trial with night splints and follow-up in 2 weeks to monitor for any changes.

## 2020-02-19 ENCOUNTER — Other Ambulatory Visit: Payer: Self-pay | Admitting: Family Medicine

## 2020-02-19 DIAGNOSIS — E119 Type 2 diabetes mellitus without complications: Secondary | ICD-10-CM

## 2020-02-19 MED ORDER — SEMAGLUTIDE(0.25 OR 0.5MG/DOS) 2 MG/1.5ML ~~LOC~~ SOPN: PEN_INJECTOR | SUBCUTANEOUS | 3 refills | Status: DC

## 2020-03-05 ENCOUNTER — Encounter: Payer: Self-pay | Admitting: Family Medicine

## 2020-03-05 ENCOUNTER — Other Ambulatory Visit: Payer: Self-pay

## 2020-03-05 ENCOUNTER — Ambulatory Visit (INDEPENDENT_AMBULATORY_CARE_PROVIDER_SITE_OTHER): Payer: No Typology Code available for payment source | Admitting: Family Medicine

## 2020-03-05 VITALS — BP 134/84 | HR 77 | Wt 304.0 lb

## 2020-03-05 DIAGNOSIS — G5603 Carpal tunnel syndrome, bilateral upper limbs: Secondary | ICD-10-CM | POA: Diagnosis not present

## 2020-03-05 MED ORDER — WRIST BRACE//LEFT LARGE MISC: 1.0000 | Freq: Every day | 0 refills | Status: DC

## 2020-03-05 MED ORDER — WRIST BRACE/RIGHT LARGE MISC: 1.0000 | Freq: Every day | 0 refills | Status: DC

## 2020-03-05 NOTE — Patient Instructions (Addendum)
It was great seeing you today!   Stop by the pharmacy to pick up your new medication.  As discussed, do not take other NSAIDs (ibuprofen, advil, motrin, ect...) while taking this medication.    Regarding your wrist braces - wear them 24 hours a day (expect in shower or performing activities involving water).   If not improving, return to clinic to discuss possible surgery.    If you have questions or concerns please do not hesitate to call at [phone].  Dr. Katherina [facility]

## 2020-03-05 NOTE — Progress Notes (Signed)
   SUBJECTIVE:   CHIEF COMPLAINT / HPI:   Chief Complaint  Patient presents with  . Carpal Tunnel     Holly [name] is a 39 y.o. female here for bilateral wrist pain.    Carpal Tunnel  Pt reports her hands have been locking up about two years. States recently sx are worse. Right hand is worse than left. Has a right wrist brace that she wears when she is working and sleep. 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 She has been taking time off of work because she can't type. Pain is described as numbness, tingling and then needle like pain in her hands. Pain rated 6 at start but reaches for 10/10. Took ibuprofen and Advil with little relief. Radiates from all her fingers to the lateral part of her right shoulder. Denies neck pain. Patient has to flap her hands to wake them up.     PERTINENT  PMH / PSH: Reviewed and updated as appropriate   OBJECTIVE:   BP 134/84   Pulse 77   Wt (!) 304 lb (137.9 kg)   SpO2 99%   BMI 50.59 kg/m   GEN: pleasant age appearing female, in no acute distress CV: regular rate and rhythm, no murmurs appreciated  RESP: no increased work of breathing, clear to ascultation bilaterally  ABD: obese abdomen  MSK: Wrist bilateral: Inspection yielded no erythema, ecchymosis, bony deformity, or swelling. ROM full with good flexion and extension and ulnar/radial deviation that is symmetrical with opposite wrist. Palpation is normal over metacarpals, scaphoid; tendons without tenderness/swelling. Strength 5/5 in all directions without pain. Negative Finkelstein. Positive tinel's test on the right. Tender to palpation over right CMC joint. Bilateral radial pulse palpable.  SKIN: warm, dry    ASSESSMENT/PLAN   Carpal tunnel syndrome, bilateral OMT discussed and verbal consent contained. Performed thoracic outlet release, muscle energy on bilateral upper extremities and flexor retinaculum soft tissue stretch. Patient tolerated OMT  well. Encouraged oral hydration. Start Meloxicam.  Advised pt not take other NSAIDs (ibuprofen, advil, motrin, ect...) while taking this medication.  Patient also have some underlying CMC arthritis.  Bilateral wrist braces provided. Wear wrist braces 24 hours a day (expect in shower or performing activities involving water).   If not improving, return to clinic to discuss possible referral to hand surgery.           Katha Cabal, DO PGY-2,  Family Medicine 03/05/2020

## 2020-03-07 MED ORDER — MELOXICAM 15 MG PO TABS: 15.0000 mg | ORAL_TABLET | Freq: Every day | ORAL | 0 refills | Status: DC

## 2020-03-07 NOTE — Assessment & Plan Note (Addendum)
OMT discussed and verbal consent contained. Performed thoracic outlet release, muscle energy on bilateral upper extremities and flexor retinaculum soft tissue stretch. Patient tolerated OMT well. Encouraged oral hydration. Start Meloxicam.  Advised pt not take other NSAIDs (ibuprofen, advil, motrin, ect...) while taking this medication.  Patient also have some underlying CMC arthritis.  Bilateral wrist braces provided. Wear wrist braces 24 hours a day (expect in shower or performing activities involving water).   If not improving, return to clinic to discuss possible referral to hand surgery.

## 2020-03-30 ENCOUNTER — Ambulatory Visit: Payer: No Typology Code available for payment source | Admitting: Family Medicine

## 2020-03-31 NOTE — Progress Notes (Signed)
SUBJECTIVE:   CHIEF COMPLAINT / HPI:   Concern for carpal tunnel: Patient is a 39 year old female presenting today for concern of carpal tunnel syndrome. Patient was previously evaluated initially on 02/18/2020 with bilateral wrist pain radiating to her fingers which was worse on the dominant arm.  At that time she had a negative Tinel's, negative reverse Tinel's, negative Phalen's but her story was consistent with carpal tunnel.  She was provided a trial of night splints and plan for follow-up in 2 weeks.  At the 2-week appointment the patient did receive some osteopathic manipulative treatment including thoracic outlet release and muscle energy on the bilateral upper extremities and flexor retinaculum soft tissue stretch to try to improve her symptoms she tolerated this well.  She was encouraged to follow with oral hydration and was started on meloxicam with recommendation to not use other NSAIDs during this time.  She continued with bilateral wrist braces with a plan to wear those 24 hours a day except when showering.  Today she states her symptoms have worsened since undergoing the prior treatments. She states she has been wearing the wrist splints 24 hours a day but her pain has worsened. She started getting pain in the 4th and 5th digits of the right hand about 2 weeks ago. She isn't experiencing it in the left hand and thinks it only started after using the wrist splints 24hrs a day, and states she does rest that elbow a lot.   Diabetic Follow Up: Patient is a 39 y.o. female who present today for diabetic follow up.   Patient endorses difficulties affording medications she was previously prescribed semaglutide to help with both her diabetes and weight loss but unfortunately was unable to for this with her insurance.  Home medications include: None.  The patient is not currently on a statin   Most recent A1Cs:  Lab Results  Component Value Date   HGBA1C 6.9 (A) 04/01/2020   HGBA1C 6.4  11/21/2019   HGBA1C 7.8 (A) 08/05/2019   Last Microalbumin, LDL, Creatinine: Lab Results  Component Value Date   MICROALBUR 80 08/20/2019   LDLCALC 107 08/05/2015   CREATININE 0.77 07/01/2019   Patient does not check blood glucose on a regular basis.  Patient is up to date on diabetic eye. Patient is up to date on diabetic foot exam.  PERTINENT  PMH / PSH: History of diabetes  OBJECTIVE:   BP (!) 150/90   Pulse 98   Ht 5\' 5"  (1.651 m)   Wt (!) 306 lb 4 oz (138.9 kg)   SpO2 (!) 81%   BMI 50.96 kg/m    General: NAD, pleasant, able to participate in exam Cardiac: RRR, no murmurs. Respiratory: CTAB, normal effort MSK: Strength 5/5 in bilateral grip and finger separation.  Patient does have a negative Phalen's but a positive Tinel's bilaterally.  When doing a Tinel's test at the elbow patient does not have any ulnar nerve symptoms in either arm.  This further supports the diagnosis of bilateral carpal tunnel syndrome. Neuro: alert, no obvious focal deficits Psych: Normal affect and mood  ASSESSMENT/PLAN:   Diabetes mellitus without complication [facility]) Assessment: 39 year old female with type 2 diabetes and previous A1c of 6.4 with A1c of 6.9 today.  No meds currently as patient couldn't afford semaglutide.  Patient is not currently on a statin Plan: -A1c as above -Recommended patient follow up with PCP to discuss other options for SGLT or GLP medications that may work with her insurance -  Discussed starting a statin as recommended and patient plans to hold off at this time pending her cholesterol panel and further discussion with her PCP.  Carpal tunnel syndrome, bilateral Assessment: 39 year old female with a previous history of pain radiating into her bilateral wrist which was thought to be caused by bilateral carpal tunnel syndromes.  She has been evaluated multiple times for this and was provided initial trial of night splints followed by 24 hour/day wrist splints to help  ease her symptoms.  She also received some OMT treatment to help with her symptoms.  Patient was also started on meloxicam to aid with symptomatic relief.  Physical exam today with positive Tinel's sign bilaterally, negative Phalen's.  The patient does endorse some ulnar type symptoms on her right arm but states that he started after using the wrist brace which I feel is more likely due to the fact that she is resting her elbow on hard surfaces.  When performing the Tinel's test at the elbow to look for ulnar symptoms she is not experiencing any pain shooting down to her fourth or fifth digit. Plan: -Sports medicine referral placed  - physical therapy referral -The patient does not experience any benefit with either of these will consider orthopedic surgery referral    We will get lipid panel to screen for hyperlipidemia particularly with patient's history of type 2 diabetes.  Jackelyn Poling, DO [facility]    This note was prepared using Dragon voice recognition software and may include unintentional dictation errors due to the inherent limitations of voice recognition software.

## 2020-03-31 NOTE — Patient Instructions (Signed)
It was great to see you! Thank you for allowing me to participate in your care!  Our plans for today:  -Today we checked your A1c, it is 6.9 which is technically in the diet controlled at goal diabetic range.  I do recommend that you schedule follow-up appointment with your PCP to discuss other GLP medications that your insurance may cover better than the semaglutide.  I have sent a message to your PCP to inform her of this recommendation as well. -I do recommend you consider a statin medication as you are 40 or over with a history of diabetes and this is a recommendation.  We are checking a cholesterol panel today and I will let you know the results when it returns. -For your carpal tunnel I am sending in a referral for physical therapy as well as for sports medicine.  You should hear from both of these within the next 2 weeks to schedule appointments.  If they are unsuccessful in helping with your symptoms I think the neck step would be considering orthopedic surgery. -Continue using the wrist splints and working on stretching the wrist as this may help your symptoms until you are seen by them.  We are checking some labs today, I will call you if they are abnormal will send you a MyChart message or a letter if they are normal.  If you do not hear about your labs in the next 2 weeks please let us know.  Take care and seek immediate care sooner if you develop any concerns.   Dr. Jackelyn Poling, DO [facility]

## 2020-04-01 ENCOUNTER — Other Ambulatory Visit: Payer: Self-pay

## 2020-04-01 ENCOUNTER — Ambulatory Visit (INDEPENDENT_AMBULATORY_CARE_PROVIDER_SITE_OTHER): Payer: No Typology Code available for payment source | Admitting: Family Medicine

## 2020-04-01 ENCOUNTER — Encounter: Payer: Self-pay | Admitting: Family Medicine

## 2020-04-01 VITALS — BP 150/90 | HR 98 | Ht 65.0 in | Wt 306.2 lb

## 2020-04-01 DIAGNOSIS — G5603 Carpal tunnel syndrome, bilateral upper limbs: Secondary | ICD-10-CM

## 2020-04-01 DIAGNOSIS — E119 Type 2 diabetes mellitus without complications: Secondary | ICD-10-CM

## 2020-04-01 DIAGNOSIS — Z13220 Encounter for screening for lipoid disorders: Secondary | ICD-10-CM | POA: Diagnosis not present

## 2020-04-01 LAB — POCT GLYCOSYLATED HEMOGLOBIN (HGB A1C): Hemoglobin A1C: 6.9 % — AB (ref 4.0–5.6)

## 2020-04-01 NOTE — Assessment & Plan Note (Addendum)
Assessment: 39 year old female with type 2 diabetes and previous A1c of 6.4 with A1c of 6.9 today.  No meds currently as patient couldn't afford semaglutide.  Patient is not currently on a statin Plan: -A1c as above -Recommended patient follow up with PCP to discuss other options for SGLT or GLP medications that may work with her insurance - Discussed starting a statin as recommended and patient plans to hold off at this time pending her cholesterol panel and further discussion with her PCP.

## 2020-04-01 NOTE — Assessment & Plan Note (Addendum)
Assessment: 39 year old female with a previous history of pain radiating into her bilateral wrist which was thought to be caused by bilateral carpal tunnel syndromes.  She has been evaluated multiple times for this and was provided initial trial of night splints followed by 24 hour/day wrist splints to help ease her symptoms.  She also received some OMT treatment to help with her symptoms.  Patient was also started on meloxicam to aid with symptomatic relief.  Physical exam today with positive Tinel's sign bilaterally, negative Phalen's.  The patient does endorse some ulnar type symptoms on her right arm but states that he started after using the wrist brace which I feel is more likely due to the fact that she is resting her elbow on hard surfaces.  When performing the Tinel's test at the elbow to look for ulnar symptoms she is not experiencing any pain shooting down to her fourth or fifth digit. Plan: -Sports medicine referral placed  - physical therapy referral -The patient does not experience any benefit with either of these will consider orthopedic surgery referral

## 2020-04-01 NOTE — Assessment & Plan Note (Signed)
>>  ASSESSMENT AND PLAN FOR DIABETES MELLITUS WITHOUT COMPLICATION (HCC) WRITTEN ON 04/01/2020  4:36 PM BY WELBORN, RYAN, DO  Assessment: 39 year old female with type 2 diabetes and previous A1c of 6.4 with A1c of 6.9 today.  No meds currently as patient couldn't afford semaglutide .  Patient is not currently on a statin Plan: -A1c as above -Recommended patient follow up with PCP to discuss other options for SGLT or GLP medications that may work with her insurance - Discussed starting a statin as recommended and patient plans to hold off at this time pending her cholesterol panel and further discussion with her PCP.

## 2020-04-02 ENCOUNTER — Ambulatory Visit: Payer: No Typology Code available for payment source | Attending: Internal Medicine

## 2020-04-02 DIAGNOSIS — Z23 Encounter for immunization: Secondary | ICD-10-CM

## 2020-04-02 LAB — LIPID PANEL
Chol/HDL Ratio: 5.5 ratio — ABNORMAL HIGH (ref 0.0–4.4)
Cholesterol, Total: 180 mg/dL (ref 100–199)
HDL: 33 mg/dL — ABNORMAL LOW (ref >39)
LDL Chol Calc (NIH): 121 mg/dL — ABNORMAL HIGH (ref 0–99)
Triglycerides: 146 mg/dL (ref 0–149)
VLDL Cholesterol Cal: 26 mg/dL (ref 5–40)

## 2020-04-02 NOTE — Progress Notes (Signed)
   Covid-19 Vaccination Clinic  Name:  NYNA CHILTON    MRN: [id number] DOB: 27-Feb-1981  04/02/2020  Ms. Tappen was observed post Covid-19 immunization for 15 minutes without incident. She was provided with Vaccine Information Sheet and instruction to access the V-Safe system.   Ms. Frontera was instructed to call 911 with any severe reactions post vaccine: [name] Difficulty breathing  . Swelling of face and throat  . A fast heartbeat  . A bad rash all over body  . Dizziness and weakness   Immunizations Administered    Name Date Dose VIS Date Route   Pfizer COVID-19 Vaccine 04/02/2020  3:44 PM 0.3 mL 02/04/2020 Intramuscular   Manufacturer: [employer], [employer]   Lot: [id number]   NDC: [address]-9

## 2020-04-07 NOTE — Progress Notes (Signed)
    SUBJECTIVE:   CHIEF COMPLAINT / HPI: Rash in axillary region  Patient reports that this rash has been present for about 10 days. She reports that she noticed the swelling in her right axilla. She reports some mild tenderness, she is not having any difficulty moving her right arm. She denies any numbness or tingling. She also reports having her covid vaccine booster around the time that she noticed the swelling/tenderness. She has not noticed any skin changes and denies any bleeding or drainage from the area. She reports having a normal mammogram that I am unable to review in the chart.   HTN  Home usually high 120s/80-90 up to 130s No headache  Normal vision  She reports taking amlodipine but has not been able to pick up HCTZ yet.   PERTINENT  PMH / PSH:  Dyshidrotic eczema HTN   OBJECTIVE:   BP 134/82   Pulse 93   Wt (!) 305 lb 6.4 oz (138.5 kg)   SpO2 99%   BMI 50.82 kg/m   General: female appearing stated age in no acute distress Cardio: Normal S1 and S2, no S3 or S4. Rhythm is regular. No murmurs or rubs.  Bilateral radial pulses palpable Pulm: Clear to auscultation bilaterally, no crackles, wheezing, or diminished breath sounds.  Abdomen: Bowel sounds normal. Abdomen soft and non-tender.  Extremities: right axillary enlarged LN, no skin changes, no breaks in skin, no bleeding Neuro: pt alert and oriented x4, follows commands, PERRLA, EOMI bilaterally, 5/5 strength  ASSESSMENT/PLAN:   HTN (hypertension) Patient advised to follow up for BP check  Patient asymptomatic,  BP slightly improved to 154/87 on recheck Advised her to pick up new prescription and take it daily prior to follow up  ED precautions given, pt voiced understanding   LAD (lymphadenopathy), axillary The timeline of enlarged LN and covid vaccine in the affected axilla makes this likely reaction to vaccine. Counseled patient on likely self resolution within the next few weeks. Given precautions including  increased pain, skin changes, inability to use affected arm/numbness or tingling.     Ronnald Ramp, MD [facility]

## 2020-04-08 ENCOUNTER — Encounter: Payer: Self-pay | Admitting: Family Medicine

## 2020-04-08 ENCOUNTER — Other Ambulatory Visit: Payer: Self-pay

## 2020-04-08 ENCOUNTER — Ambulatory Visit (INDEPENDENT_AMBULATORY_CARE_PROVIDER_SITE_OTHER): Payer: No Typology Code available for payment source | Admitting: Family Medicine

## 2020-04-08 DIAGNOSIS — I10 Essential (primary) hypertension: Secondary | ICD-10-CM

## 2020-04-08 DIAGNOSIS — R590 Localized enlarged lymph nodes: Secondary | ICD-10-CM | POA: Diagnosis not present

## 2020-04-08 MED ORDER — DICLOFENAC SODIUM 1 % EX GEL: 4.0000 g | Freq: Four times a day (QID) | CUTANEOUS | 1 refills | Status: DC

## 2020-04-08 NOTE — Patient Instructions (Signed)
I believe that the swelling under your arm is due to the enlarged lymph node after having your Covid vaccine booster.  I expect for this to resolve in the next 2 weeks.  Please return to care if this persists and I recommend follow-up as scheduled in January to check on your blood pressure.  I will continue to measure your blood pressure daily at home and return to care sooner if you begin to have headaches, chest pain, difficulty breathing or notice changes in your vision.

## 2020-04-10 DIAGNOSIS — R590 Localized enlarged lymph nodes: Secondary | ICD-10-CM | POA: Insufficient documentation

## 2020-04-10 NOTE — Assessment & Plan Note (Signed)
The timeline of enlarged LN and covid vaccine in the affected axilla makes this likely reaction to vaccine. Counseled patient on likely self resolution within the next few weeks. Given precautions including increased pain, skin changes, inability to use affected arm/numbness or tingling.

## 2020-04-10 NOTE — Assessment & Plan Note (Signed)
Patient advised to follow up for BP check  Patient asymptomatic,  BP slightly improved to 154/87 on recheck Advised her to pick up new prescription and take it daily prior to follow up  ED precautions given, pt voiced understanding

## 2020-04-19 ENCOUNTER — Ambulatory Visit: Payer: No Typology Code available for payment source | Admitting: Family Medicine

## 2020-04-21 ENCOUNTER — Other Ambulatory Visit: Payer: Self-pay

## 2020-04-21 ENCOUNTER — Ambulatory Visit (INDEPENDENT_AMBULATORY_CARE_PROVIDER_SITE_OTHER): Payer: No Typology Code available for payment source | Admitting: Family Medicine

## 2020-04-21 VITALS — BP 135/74 | Ht 65.5 in | Wt 302.0 lb

## 2020-04-21 DIAGNOSIS — G5603 Carpal tunnel syndrome, bilateral upper limbs: Secondary | ICD-10-CM

## 2020-04-21 NOTE — Patient Instructions (Signed)
You have carpal tunnel syndrome. I would encourage you to wear the wrist braces when you sleep only if possible even though they haven't helped much to date. We will go ahead with nerve conduction studies to assess how severe this is - this will dictate whether we do injections or refer you to the hand surgeon. Call me a couple days after you have the nerve test done if you haven't heard from me yet.

## 2020-04-21 NOTE — Progress Notes (Signed)
PCP: Sharion Settler, DO  Subjective:   HPI: Patient is a 40 y.o. female here for bilateral wrist pain.  Patient reports pain has gotten worse. She is finding it hard to do her everyday activities (brush hair, unscrew things, put on gloves). Pain is worse on the right than left. She has been having bilateral wrist and forearm pain for past 2 years.   Hands lock up at her job when she types. Needle-like and "slege hammer" pain went away but about 6 months ago pain flared up again. Pain shoots into her hands and up into her shoulder. Pain does not wake her up from sleep. Taking Tylenol and using Voltaren gel for pain with minimal relief. She wears bilateral wrist braces without relief.  Has tried meloxicam, ibuprofen and other NSAIDs minimal relief..  Pain rated 5/10 at rest. 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   Past Medical History:  Diagnosis Date  . Eczema    Hx: of  . Pregnancy   . Pregnancy induced hypertension   . Seasonal allergies    Hx: of    Current Outpatient Medications on File Prior to Visit  Medication Sig Dispense Refill  . Accu-Chek Softclix Lancets lancets Use daily 100 each 12  . albuterol (PROVENTIL HFA;VENTOLIN HFA) 108 (90 Base) MCG/ACT inhaler Inhale 2 puffs into the lungs every 6 (six) hours as needed for wheezing or shortness of breath. 1 Inhaler 2  . amLODipine (NORVASC) 5 MG tablet Take 1 tablet (5 mg total) by mouth daily. 30 tablet 3  . Blood Glucose Monitoring Suppl (ACCU-CHEK AVIVA) device Use daily 1 each 0  . Blood Glucose Monitoring Suppl (ACCU-CHEK GUIDE) w/Device KIT Check blood sugar daily. 1 kit 0  . diclofenac Sodium (VOLTAREN) 1 % GEL Apply 4 g topically 4 (four) times daily. 350 g 1  . Elastic Bandages & Supports (WRIST BRACE//LEFT LARGE) MISC 1 each by Does not apply route daily. 1 each 0  . Elastic Bandages & Supports (WRIST BRACE/RIGHT LARGE) MISC 1 each by Does not apply route daily. 1  each 0  . fluticasone (FLONASE) 50 MCG/ACT nasal spray Place 2 sprays into both nostrils daily. 16 g 6  . glucose blood (ACCU-CHEK GUIDE) test strip Use as instructed 100 each 12  . hydrochlorothiazide (HYDRODIURIL) 25 MG tablet Take 1 tablet (25 mg total) by mouth daily. 90 tablet 3  . Lancets Misc. (ACCU-CHEK SOFTCLIX LANCET DEV) KIT 1 Device by Does not apply route daily. (Patient not taking: Reported on 12/25/2019) 1 kit 1   Current Facility-Administered Medications on File Prior to Visit  Medication Dose Route Frequency Provider Last Rate Last Admin  . triamcinolone lotion (KENALOG) 0.1 %   Topical BID PRN Gifford Shave, MD        Past Surgical History:  Procedure Laterality Date  . c-sectionx1    . CESAREAN SECTION    . CHOLECYSTECTOMY N/A 08/30/2012   Procedure: LAPAROSCOPIC CHOLECYSTECTOMY WITH INTRAOPERATIVE CHOLANGIOGRAM;  Surgeon: Harl Bowie, MD;  Location: [facility];  Service: General;  Laterality: N/A;  . NO PAST SURGERIES    . UPPER GI ENDOSCOPY     Hx: of    Allergies  Allergen Reactions  . Naproxen Sodium     Chest burns     Social History   Socioeconomic History  . Marital status: Single    Spouse name: Not on file  . Number of children: 1  .  Years of education: Not on file  . Highest education level: Not on file  Occupational History  . Occupation: Presenter, broadcasting   Tobacco Use  . Smoking status: Never Smoker  . Smokeless tobacco: Never Used  Vaping Use  . Vaping Use: Never used  Substance and Sexual Activity  . Alcohol use: No  . Drug use: No  . Sexual activity: Yes    Birth control/protection: I.U.D.  Other Topics Concern  . Not on file  Social History Narrative  . Not on file   Social Determinants of Health   Financial Resource Strain: Not on file  Food Insecurity: Not on file  Transportation Needs: Not on file  Physical Activity: Not on file  Stress: Not on file  Social Connections: Not on file  Intimate Partner Violence: Not  on file    Family History  Problem Relation Age of Onset  . Hypertension Father   . Hyperlipidemia Father   . Lung cancer Paternal Grandmother   . Diabetes Maternal Grandfather   . Kidney disease Paternal Uncle   . Colon cancer Neg Hx   . Esophageal cancer Neg Hx   . Stomach cancer Neg Hx     BP 135/74   Ht 5' 5.5" (1.664 m)   Wt (!) 302 lb (137 kg)   BMI 49.49 kg/m   No flowsheet data found.  No flowsheet data found.  Review of Systems: See HPI above.     Objective:   Physical Exam:  Gen: NAD, comfortable in exam room Wrist, right: Inspection yielded no erythema, ecchymosis, bony deformity, or swelling. ROM  with good flexion and extension and ulnar/radial deviation that is symmetrical with opposite wrist. Palpation is normal over metacarpals (2-5), scaphoid; tenderness present at 1st [facility] joint mildly. Strength 5/5 in all directions without pain. Negative Finkelstein, positive tinel's and negative phalens.   Wrist, left: Inspection yielded no erythema, ecchymosis, bony deformity, or swelling. ROM  with good flexion and extension and ulnar/radial deviation that is symmetrical with opposite wrist. Palpation is normal over metacarpals, scaphoid; , tendons with out tenderness/swelling. Strength 5/5 in all directions without pain. Negative Finkelstein, positive tinel's and negative phalens.   Assessment & Plan:  1. Bilateral Carpal Tunnel    Bedside ultrasound performed was notable for median nerve enlargement; right: 0.28 cm^2 and left median nerve 0.26cm^2. Findings concerning for moderate-severe carpal tunnel syndrome. Patient failed conservative therapy. Discussed treatment options (retinaculum release with hand surgery vs corticosteroid injections). Referral placed for NCV/EMG. Will discuss next steps with patient once NCV/EMG results are available for review.   Lyndee Hensen, DO PGY-2, [facility] 04/21/2020

## 2020-04-22 ENCOUNTER — Encounter: Payer: Self-pay | Admitting: Family Medicine

## 2020-04-25 NOTE — Progress Notes (Signed)
    SUBJECTIVE:   CHIEF COMPLAINT / HPI: blood pressure follow up   HTN  Holly [name] is a 40 y.o. female presenting to day for blood pressure follow up. She reports no increased frequency of HA, no blurry vision, no chest pain. Patient states that she feels "fine". She reports that she has been able to decrease her salt intake and has changed her diet completely in attempts to help with her BP and glucose control. She reports aiming to drink 1 gallon of water per day and is increasing her exercise frequency. She also reports dealing with stress of job changes, moving and financial challenges which may be contributing to her blood pressure elevation.   DM Patient's last Hgb A1c 6.9. Patient is currently not on any medications for this. Discussed SLGTi vs GLp1 agonists. Patient is open to starting GLP1 agonist today.   HLD  Last lipid panel showed low HDL of 31 and elevated LDL of 121. ASCVD score of 30% risk with patient recommendation for high intensity statin given hx of DM. Discussed rosuvastatin with patient which she would like to hold off on as we are starting new mediations today.    PERTINENT  PMH / PSH:  Obesity  DM  HTN  Asthma   OBJECTIVE:   BP (!) 152/104 (BP Location: Left Arm, Patient Position: Sitting, Cuff Size: Large)   Pulse 84   Ht 5' 5.5" (1.664 m)   Wt (!) 304 lb (137.9 kg)   BMI 49.82 kg/m    General: female appearing stated age in no acute distress Cardio: Normal S1 and S2, no S3 or S4. Rhythm is regular. No murmurs or rubs.  Bilateral radial pulses palpable Pulm: Clear to auscultation bilaterally, no crackles, wheezing, or diminished breath sounds. Normal respiratory effort Extremities: No peripheral pitting edema. Warm/ well perfused. Neuro: pt alert and oriented x4, 5/5 strength, PERRLA   ASSESSMENT/PLAN:   HTN (hypertension) Blood pressure remains elevated 152/104 initially. Repeat 154/96. Patient is asymptomatic. -Will increase amlodipine from  5 to 10 mg daily -Patient to record blood pressure at least once daily -Patient to follow-up in 1 week -We will check BMP today  Diabetes mellitus without complication (HCC) Start Trulicity 0.75mg  weekly   HLD (hyperlipidemia) LDL 121 with 30% ASCVD score. Counseled patient on dietary changes and considering starting statin therapy. Patient still menstruating so may need to hold off on statin therapy due to teratogenic effects. Will continue to advise lifestyle modifications to help lower agents and revisit statin therapy at later date.     Ronnald Ramp, MD [facility]

## 2020-04-26 ENCOUNTER — Other Ambulatory Visit: Payer: Self-pay

## 2020-04-26 ENCOUNTER — Encounter: Payer: Self-pay | Admitting: Family Medicine

## 2020-04-26 ENCOUNTER — Ambulatory Visit (INDEPENDENT_AMBULATORY_CARE_PROVIDER_SITE_OTHER): Payer: No Typology Code available for payment source | Admitting: Family Medicine

## 2020-04-26 VITALS — BP 152/104 | HR 84 | Ht 65.5 in | Wt 304.0 lb

## 2020-04-26 DIAGNOSIS — E782 Mixed hyperlipidemia: Secondary | ICD-10-CM

## 2020-04-26 DIAGNOSIS — I10 Essential (primary) hypertension: Secondary | ICD-10-CM

## 2020-04-26 DIAGNOSIS — E119 Type 2 diabetes mellitus without complications: Secondary | ICD-10-CM

## 2020-04-26 MED ORDER — AMLODIPINE BESYLATE 10 MG PO TABS
10.0000 mg | ORAL_TABLET | Freq: Every day | ORAL | 1 refills | Status: DC
Start: 2020-04-26 — End: 2020-06-21

## 2020-04-26 MED ORDER — TRULICITY 0.75 MG/0.5ML ~~LOC~~ SOAJ: 0.7500 mg | SUBCUTANEOUS | 2 refills | Status: DC

## 2020-04-26 NOTE — Assessment & Plan Note (Addendum)
Blood pressure remains elevated 152/104 initially. Repeat 154/96. Patient is asymptomatic. -Will increase amlodipine from 5 to 10 mg daily -Patient to record blood pressure at least once daily -Patient to follow-up in 1 week -We will check BMP today

## 2020-04-26 NOTE — Patient Instructions (Signed)
Your blood pressure continues to be elevated.  Today we have increased amlodipine to 10 mg daily.  Please take 2 of the 5 mg tablets until you are able to pick up the 10 mg tablet prescription.  Please continue to take your hydrochlorothiazide with the new amlodipine dosage. For the next appointment, please measure your blood pressure once daily so that we can review what your blood pressure averages are at home.  It will be important to monitor for any development of chest pain, blurry vision, headache or difficulty breathing in the setting of any elevated blood pressure.  If you experience these symptoms please seek immediate care in the ED.  At the next appointment, we will further discuss starting rosuvastatin to help lower your cholesterol.   Congratulations on your hard work to improve your diet and steps towards better overall health.

## 2020-04-27 LAB — BASIC METABOLIC PANEL
BUN/Creatinine Ratio: 11 (ref 9–23)
BUN: 7 mg/dL (ref 6–24)
CO2: 25 mmol/L (ref 20–29)
Calcium: 9.2 mg/dL (ref 8.7–10.2)
Chloride: 97 mmol/L (ref 96–106)
Creatinine, Ser: 0.66 mg/dL (ref 0.57–1.00)
GFR calc Af Amer: 128 mL/min/{1.73_m2} (ref >59)
GFR calc non Af Amer: 111 mL/min/{1.73_m2} (ref >59)
Glucose: 141 mg/dL — ABNORMAL HIGH (ref 65–99)
Potassium: 4.2 mmol/L (ref 3.5–5.2)
Sodium: 140 mmol/L (ref 134–144)

## 2020-04-28 DIAGNOSIS — E785 Hyperlipidemia, unspecified: Secondary | ICD-10-CM | POA: Insufficient documentation

## 2020-04-28 NOTE — Assessment & Plan Note (Signed)
>>  ASSESSMENT AND PLAN FOR DIABETES MELLITUS WITHOUT COMPLICATION (HCC) WRITTEN ON 04/28/2020  9:21 PM BY MARCINE COYER, MD  Start Trulicity  0.75mg  weekly

## 2020-04-28 NOTE — Assessment & Plan Note (Signed)
LDL 121 with 30% ASCVD score. Counseled patient on dietary changes and considering starting statin therapy. Patient still menstruating so may need to hold off on statin therapy due to teratogenic effects. Will continue to advise lifestyle modifications to help lower agents and revisit statin therapy at later date.

## 2020-04-28 NOTE — Assessment & Plan Note (Signed)
-   Start Trulicity 0.75 mg weekly     

## 2020-05-01 ENCOUNTER — Other Ambulatory Visit: Payer: Self-pay | Admitting: Family Medicine

## 2020-05-01 DIAGNOSIS — G5603 Carpal tunnel syndrome, bilateral upper limbs: Secondary | ICD-10-CM

## 2020-05-03 ENCOUNTER — Encounter: Payer: No Typology Code available for payment source | Admitting: Neurology

## 2020-05-03 ENCOUNTER — Ambulatory Visit: Payer: No Typology Code available for payment source | Admitting: Family Medicine

## 2020-05-06 ENCOUNTER — Other Ambulatory Visit: Payer: Self-pay

## 2020-05-06 ENCOUNTER — Ambulatory Visit: Payer: No Typology Code available for payment source | Attending: Family Medicine | Admitting: Physical Therapy

## 2020-05-06 ENCOUNTER — Encounter: Payer: Self-pay | Admitting: Physical Therapy

## 2020-05-06 DIAGNOSIS — M25532 Pain in left wrist: Secondary | ICD-10-CM

## 2020-05-06 DIAGNOSIS — M25531 Pain in right wrist: Secondary | ICD-10-CM | POA: Insufficient documentation

## 2020-05-06 DIAGNOSIS — M6281 Muscle weakness (generalized): Secondary | ICD-10-CM | POA: Diagnosis present

## 2020-05-06 NOTE — Patient Instructions (Addendum)
Access Code: [email or web address] URL: [email or web address] Date: 05/06/2020 Prepared by: Rosana Hoes  Exercises Seated Scapular Retraction - 5 x daily - 7 x weekly - 10 reps - 5 seconds hold Seated Wrist Extension with Dumbbell - 1-2 x daily - 7 x weekly - 2 sets - 10 reps Seated Wrist Flexion with Dumbbell - 1-2 x daily - 7 x weekly - 2 sets - 10 reps Seated Wrist Ulnar Deviation with Dumbbell - 1-2 x daily - 7 x weekly - 2 sets - 10 reps Seated Wrist Radial Deviation with Dumbbell - 1-2 x daily - 7 x weekly - 2 sets - 10 reps Median Nerve Flossing - Tray - 2-3 x daily - 7 x weekly - 10 reps  Patient Education Office Posture

## 2020-05-07 ENCOUNTER — Encounter: Payer: Self-pay | Admitting: Physical Therapy

## 2020-05-07 NOTE — Therapy (Signed)
[facility] [address] [place], [place], [address] Phone: [phone]   Fax:  [phone]  Physical Therapy Evaluation  Patient Details  Name: Holly [name] MRN: [id number] Date of Birth: [birth year] Referring Provider (PT): Doreene Eland, MD   Encounter Date: 05/06/2020   PT End of Session - 05/06/20 1557    Visit Number 1    Number of Visits 6    Date for PT Re-Evaluation 06/17/20    Authorization Type [employer]    PT Start Time 1600    PT Stop Time 1645    PT Time Calculation (min) 45 min    Activity Tolerance Patient tolerated treatment well    Behavior During Therapy WFL for tasks assessed/performed           Past Medical History:  Diagnosis Date   Eczema    Hx: of   Pregnancy    Pregnancy induced hypertension    Seasonal allergies    Hx: of    Past Surgical History:  Procedure Laterality Date   c-sectionx1     CESAREAN SECTION     CHOLECYSTECTOMY N/A 08/30/2012   Procedure: LAPAROSCOPIC CHOLECYSTECTOMY WITH INTRAOPERATIVE CHOLANGIOGRAM;  Surgeon: Shelly Rubenstein, MD;  Location: [facility] OR;  Service: General;  Laterality: N/A;   NO PAST SURGERIES     UPPER GI ENDOSCOPY     Hx: of    There were no vitals filed for this visit.    Subjective Assessment - 05/06/20 1601    Subjective Patient reports having pain in her wrists (right more left typically) that travels into her hands and her arms. She reports mainly feeling pain in her palm when it travels, and it can go up to her shoulder. Sometimes the pain is stabbing and like a jolt of pain that will shoot up to her arms. Sometimes she may have tingling into her hand as well. When pain occurs it will go away after a while. She wears braces at night, still painful even when she wears them. Patient denies pain with rest, mainly associated with activity. She doesn't report any strength deficits with gripping or lifting, but can be painful. Patient  works from home on a computer so doing a lot of typing and using the mouse.    Limitations House hold activities    How long can you sit comfortably? No limitation    How long can you stand comfortably? No limitation    How long can you walk comfortably? No limitation    Diagnostic tests NCV testing on 05/19/20    Patient Stated Goals Be able to avoid wrists getting worse and work without pain    Currently in Pain? Yes    Pain Score 4     Pain Location Wrist    Pain Orientation Right;Left   R > L   Pain Descriptors / Indicators Throbbing    Pain Type Chronic pain    Pain Radiating Towards occasionally into hands and up arm to shoulder    Pain Onset More than a month ago    Pain Frequency Intermittent    Aggravating Factors  Typing and using mouse, screwing open a jar or normal activites around the house at the end of the day    Pain Relieving Factors Topical cream prescription              [facility] PT Assessment - 05/07/20 0001      Assessment   Medical Diagnosis Carpal tunnel syndrome,  bilateral    Referring Provider (PT) Doreene ElandEniola, Kehinde T, MD    Onset Date/Surgical Date --   approximately 6 months ago   Hand Dominance Right    Next MD Visit 05/19/2020    Prior Therapy None      Precautions   Precautions None      Restrictions   Weight Bearing Restrictions No      Balance Screen   Has the patient fallen in the past 6 months No    Has the patient had a decrease in activity level because of a fear of falling?  No    Is the patient reluctant to leave their home because of a fear of falling?  No      Prior Function   Level of Independence Independent    Vocation Full time employment    Vocation Requirements Patient works from home, mainly on computer    Leisure None reported, kids      Cognition   Overall Cognitive Status Within Functional Limits for tasks assessed      Observation/Other Assessments   Observations Patient appears in no apparent distress    Focus on  Therapeutic Outcomes (FOTO)  45% functional status      Sensation   Light Touch Appears Intact      Coordination   Gross Motor Movements are Fluid and Coordinated Yes      Posture/Postural Control   Posture Comments Rounded shoulder and forward head posture      ROM / Strength   AROM / PROM / Strength AROM;Strength      AROM   AROM Assessment Site Wrist    Right/Left Wrist Right    Right Wrist Extension 50 Degrees    Right Wrist Flexion 55 Degrees    Left Wrist Extension 70 Degrees    Left Wrist Flexion 60 Degrees      Strength   Strength Assessment Site Wrist;Hand    Right/Left Wrist Right;Left    Right Wrist Flexion 4/5    Right Wrist Extension 4/5    Right Wrist Radial Deviation 4/5    Right Wrist Ulnar Deviation 4/5    Left Wrist Flexion 4+/5    Left Wrist Extension 4+/5    Left Wrist Radial Deviation 4+/5    Left Wrist Ulnar Deviation 4+/5    Right/Left hand Right;Left    Right Hand Grip (lbs) 31, 30, 38    Left Hand Grip (lbs) 43, 41, 41      Palpation   Spinal mobility Patient did report increased concordant arm/wrist pain with median nerve tension    Palpation comment Non-TTP      Special Tests   Other special tests Phalens and reverse grossly negative for carpal tunnel symptoms                      Objective measurements completed on examination: See above findings.       [facility] Adult PT Treatment/Exercise - 05/07/20 0001      Exercises   Exercises Wrist;Shoulder      Shoulder Exercises: Seated   Retraction 10 reps   5 sec hold   Retraction Limitations shoulder blade squeezes      Wrist Exercises   Wrist Flexion 10 reps    Bar Weights/Barbell (Wrist Flexion) 1 lb    Wrist Extension 10 reps    Bar Weights/Barbell (Wrist Extension) 1 lb    Wrist Radial Deviation 10 reps    Bar Weights/Barbell (Radial Deviation)  5 lbs    Wrist Ulnar Deviation 10 reps    Bar Weights/Barbell (Ulnar Deviation) 5 lbs    Other wrist exercises Median  nerve floss with arm out to side x 10 each                  PT Education - 05/06/20 1557    Education Details Exam findings, POC, HEP, posture while working at desk, keeping wrist in neutral position    Person(s) Educated Patient    Methods Explanation;Demonstration;Verbal cues;Handout    Comprehension Verbalized understanding;Need further instruction;Returned demonstration;Verbal cues required            PT Short Term Goals - 05/06/20 1600      PT SHORT TERM GOAL #1   Title Patient will be I with HEP to progress with PT    Time 3    Period Weeks    Status New    Target Date 05/27/20      PT SHORT TERM GOAL #2   Title PT will review FOTO with patient by 3rd visit    Time 3    Period Weeks    Status New    Target Date 05/27/20      PT SHORT TERM GOAL #3   Title Patient will report wirst pain </= 3/10 with working > 1 hour to improve work tolerance    Time 3    Period Weeks    Status New    Target Date 05/27/20             PT Long Term Goals - 05/06/20 1601      PT LONG TERM GOAL #1   Title Patient will be I with final HEP to maintain progress from PT    Time 6    Period Weeks    Status New    Target Date 06/17/20      PT LONG TERM GOAL #2   Title Patient will report improved functional status >/= 61% on FOTO    Time 6    Period Weeks    Status New    Target Date 06/17/20      PT LONG TERM GOAL #3   Title Patient will demonstrate improve grip strength >/= 45 lbs bilaterally to improve ability to open a jar    Time 6    Period Weeks    Status New    Target Date 06/17/20      PT LONG TERM GOAL #4   Title Patient will exhibit bilateral wrist strength 5/5 MMT to improve tolerance for typing extended periods    Time 6    Period Weeks    Status New    Target Date 06/17/20      PT LONG TERM GOAL #5   Title Patient will be able to demo proper seated posture for typing and </= 2/10 pain while working >1 hour to reduce functional limitation     Time 6    Period Weeks    Status New    Target Date 06/17/20                  Plan - 05/06/20 1618    Clinical Impression Statement Patient presents to PT with report of bilateral wrist pain that will radiate into hands and up arm to shoulder. There is no specific dermatomal pattern related to pain, she reports generalized radiation and reports no sensory deficits. Patient does exhibit limitation in active motion and strength that is more  affected on the right. Patient also notes increased concordant pain with median nerve tension. Patient was provided exercises to address deficits and instructed on proper positioning while working at her desk. She would benefit from continued skilled PT to progress motion, strength, and tolerance for desk work to reduce pain and maximize functional level.    Personal Factors and Comorbidities Time since onset of injury/illness/exacerbation    Examination-Activity Limitations Carry;Lift;Other   typing   Examination-Participation Restrictions Meal Prep;Cleaning;Occupation    Stability/Clinical Decision Making Stable/Uncomplicated    Clinical Decision Making Low    Rehab Potential Good    PT Frequency 1x / week    PT Duration 6 weeks    PT Treatment/Interventions ADLs/Self Care Home Management;Cryotherapy;Electrical Stimulation;Iontophoresis 4mg /ml Dexamethasone;Moist Heat;Ultrasound;Neuromuscular re-education;Therapeutic exercise;Therapeutic activities;Functional mobility training;Patient/family education;Manual techniques;Dry needling;Passive range of motion;Taping;Joint Manipulations    PT Next Visit Plan Review HEP and progress PRN, median nerve glides, manual for wrist/forearm PRN, continues wrist/forearm/grip strengthening, postural control    PT Home Exercise Plan [email or web address]    Consulted and Agree with Plan of Care Patient           Patient will benefit from skilled therapeutic intervention in order to improve the following deficits and  impairments:  Postural dysfunction,Decreased strength,Pain,Decreased range of motion  Visit Diagnosis: Pain in left wrist  Pain in right wrist  Muscle weakness (generalized)     Problem List Patient Active Problem List   Diagnosis Date Noted   HLD (hyperlipidemia) 04/28/2020   LAD (lymphadenopathy), axillary 04/10/2020   Carpal tunnel syndrome, bilateral 02/18/2020   Toe pain, right 02/18/2020   IUD (intrauterine device) in place 11/21/2019   Seasonal allergies 10/27/2019   Eczema 10/27/2019   Diabetes mellitus without complication (HCC) 08/21/2019   Asthma 02/18/2016   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   HTN (hypertension) 09/30/2012   S/P cholecystectomy 08/29/2012   Obesity 04/15/2012    04/17/2012, PT, DPT, LAT, ATC 05/07/20  8:34 AM Phone: [phone] Fax: [phone]   [facility] [address] [place], [place], [place] Phone: [phone]   Fax:  [phone]  Name: Holly [name] MRN: [id number] Date of Birth: [birth year]

## 2020-05-10 ENCOUNTER — Encounter: Payer: Self-pay | Admitting: Family Medicine

## 2020-05-10 ENCOUNTER — Other Ambulatory Visit: Payer: Self-pay

## 2020-05-10 ENCOUNTER — Ambulatory Visit (INDEPENDENT_AMBULATORY_CARE_PROVIDER_SITE_OTHER): Payer: No Typology Code available for payment source | Admitting: Family Medicine

## 2020-05-10 DIAGNOSIS — I10 Essential (primary) hypertension: Secondary | ICD-10-CM | POA: Diagnosis not present

## 2020-05-10 MED ORDER — HYDROCHLOROTHIAZIDE 50 MG PO TABS: 50.0000 mg | ORAL_TABLET | Freq: Every day | ORAL | 0 refills | Status: DC

## 2020-05-10 NOTE — Patient Instructions (Addendum)
I have increased your hydrochlorothiazide to 50 mg daily in addition to continue your amlodipine 10 mg daily.  If possible, please check your blood pressure once daily and keep a record of this until her next visit.  Please follow-up with me in 2 weeks for blood pressure check.  Please seek care emergently if you begin to experience chest pain, shortness of breath, headaches with blurry vision.

## 2020-05-10 NOTE — Progress Notes (Signed)
    SUBJECTIVE:   CHIEF COMPLAINT / HPI: Follow-up for hypertension  Patient reports that she is not been missing any doses of amlodipine or hydrochlorothiazide.  She denies any headache, chest pain or shortness of breath.  Patient is noted to have elevated blood pressure in office today.  She reports drinking copious amounts of water.  She continues to express concerns for stress involving living situation and support for her children as her parents are moving away.  PERTINENT  PMH / PSH: HTN   OBJECTIVE:   BP 134/90   Pulse 97   Wt (!) 303 lb 9.6 oz (137.7 kg)   SpO2 98%   BMI 49.75 kg/m   General: female appearing stated age in no acute distress Cardio: Normal S1 and S2, no S3 or S4. Rhythm is regular. No murmurs or rubs.  Bilateral radial pulses palpable Pulm: Clear to auscultation bilaterally, no crackles, wheezing, or diminished breath sounds. Normal respiratory effort, stable on RA Abdomen: Bowel sounds normal. Abdomen soft and non-tender.  Extremities: trace peripheral edema. Warm/ well perfused.  Neuro: pt alert and oriented x4, 5/5 strength in bilateral upper and lower extremities, PERRLA, EOMI   ASSESSMENT/PLAN:   HTN (hypertension) Blood pressure control continues to remain challenging.  Patient reports stressful circumstances involving finances but those issues have been resolved.  She continues to deny any neurological or cardiac symptoms related to her blood pressure.  She also continues to intermittently check her blood pressure at home.   Continue amlodipine 10mg   Increased HCTZ to 50mg  daily    F/u in 2 weeks   , MD [facility]

## 2020-05-12 ENCOUNTER — Telehealth: Payer: Self-pay | Admitting: Family Medicine

## 2020-05-12 NOTE — Telephone Encounter (Signed)
Contacted patient via telephone in order to clarify specific duties of her job.  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  Her accommodation request paperwork was updated accordingly.  Will place and file to be faxed today.  Ronnald Ramp, MD [facility], PGY-2 [phone]

## 2020-05-12 NOTE — Assessment & Plan Note (Addendum)
Blood pressure control continues to remain challenging.  Patient reports stressful circumstances involving finances but those issues have been resolved.  She continues to deny any neurological or cardiac symptoms related to her blood pressure.  She also continues to intermittently check her blood pressure at home.   Continue amlodipine 10mg   Increased HCTZ to 50mg  daily

## 2020-05-17 ENCOUNTER — Ambulatory Visit (INDEPENDENT_AMBULATORY_CARE_PROVIDER_SITE_OTHER): Payer: PRIVATE HEALTH INSURANCE | Admitting: Neurology

## 2020-05-17 ENCOUNTER — Other Ambulatory Visit: Payer: Self-pay

## 2020-05-17 ENCOUNTER — Encounter (INDEPENDENT_AMBULATORY_CARE_PROVIDER_SITE_OTHER): Payer: PRIVATE HEALTH INSURANCE | Admitting: Neurology

## 2020-05-17 DIAGNOSIS — Z0289 Encounter for other administrative examinations: Secondary | ICD-10-CM

## 2020-05-17 DIAGNOSIS — G5603 Carpal tunnel syndrome, bilateral upper limbs: Secondary | ICD-10-CM | POA: Diagnosis not present

## 2020-05-17 NOTE — Procedures (Signed)
Full Name: Holly [name] Gender: Female MRN #: [id number] Date of Birth: [birth year]    Visit Date: 05/17/2020 07:57 Age: 40 Years Examining Physician: Levert Feinstein, MD  Referring Physician: Norton Blizzard, MD History: 40 years female, complains 20 years history of intermittent bilateral hands paresthesia, pain, right worse than left, mainly involving first 3 fingers  Summary of the test:  Nerve conduction study: Median sensory responses showed moderately prolonged peak latency with mildly decreased snap amplitude.  Bilateral median motor responses showed mild to moderately prolonged distal latency, right worse than left, with normal CMAP amplitude, conduction velocity  Bilateral ulnar sensory motor responses were normal  Electromyography: Selected needle examination of the bilateral upper extremity muscles, right cervical paraspinal muscles were performed.  There was evidence of mildly decreased recruitment patterns at bilateral abductor pollicis brevis, there was no evidence of spontaneous activity.  Conclusion: This is an abnormal study.  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    ------------------------------- Levert Feinstein, M.D. PhD  [facility] [address], [address] [place], [place] [address] Tel: [phone] Fax: [phone]  Verbal informed consent was obtained from the patient, patient was informed of potential risk of procedure, including bruising, bleeding, hematoma formation, infection, muscle weakness, muscle pain, numbness, among others.        MNC    Nerve / Sites Muscle Latency Ref. Amplitude Ref. Rel Amp Segments Distance Velocity Ref. Area    ms ms mV mV %  cm m/s m/s mVms  R Median - APB     Wrist APB 6.0 ?4.4 6.1 ?4.0 100 Wrist - APB 7   22.5     Upper  arm APB 9.6  4.9  80.3 Upper arm - Wrist 21 58 ?49 18.3  L Median - APB     Wrist APB 5.4 ?4.4 5.7 ?4.0 100 Wrist - APB 7   20.3     Upper arm APB 9.2  5.6  98.9 Upper arm - Wrist 21 55 ?49 20.1  R Ulnar - ADM     Wrist ADM 2.6 ?3.3 12.1 ?6.0 100 Wrist - ADM 7   32.9     B.Elbow ADM 5.3  11.2  92.4 B.Elbow - Wrist 18 68 ?49 32.1     A.Elbow ADM 7.0  11.2  100 A.Elbow - B.Elbow 10 60 ?49 31.7  L Ulnar - ADM     Wrist ADM 2.7 ?3.3 12.1 ?6.0 100 Wrist - ADM 7   31.4     B.Elbow ADM 5.8  11.5  94.4 B.Elbow - Wrist 19 61 ?49 30.5     A.Elbow ADM 7.5  11.3  98.8 A.Elbow - B.Elbow 10 59 ?49 29.7             SNC    Nerve / Sites Rec. Site Peak Lat Ref.  Amp Ref. Segments Distance    ms ms V V  cm  R Median - Orthodromic (Dig II, Mid palm)     Dig II Wrist 3.9 ?3.4 8 ?10 Dig II - Wrist 13  L Median - Orthodromic (Dig II, Mid palm)     Dig II Wrist 3.8 ?3.4 8 ?10 Dig II - Wrist 13  R Ulnar - Orthodromic, (Dig V, Mid palm)     Dig V Wrist 2.4 ?3.1 7 ?5 Dig V -  Wrist 11  L Ulnar - Orthodromic, (Dig V, Mid palm)     Dig V Wrist 2.4 ?3.1 7 ?5 Dig V - Wrist 77             F  Wave    Nerve F Lat Ref.   ms ms  R Ulnar - ADM 25.7 ?32.0  L Ulnar - ADM 25.8 ?32.0         EMG Summary Table    Spontaneous MUAP Recruitment  Muscle IA Fib PSW Fasc Other Amp Dur. Poly Pattern  R. Abductor pollicis brevis Normal None None None _______ Normal Normal Normal Reduced  R. Pronator teres Normal None None None _______ Normal Normal Normal Normal  R. Biceps brachii Normal None None None _______ Normal Normal Normal Normal  R. Deltoid Normal None None None _______ Normal Normal Normal Normal  R. Triceps brachii Normal None None None _______ Normal Normal Normal Normal  R. Brachioradialis Normal None None None _______ Normal Normal Normal Normal  R. Cervical paraspinals Normal None None None _______ Normal Normal Normal Normal  L. Abductor pollicis brevis Normal None None None _______ Normal Normal Normal  Reduced

## 2020-05-19 ENCOUNTER — Encounter: Payer: No Typology Code available for payment source | Admitting: Neurology

## 2020-05-21 ENCOUNTER — Other Ambulatory Visit: Payer: Self-pay

## 2020-05-21 ENCOUNTER — Ambulatory Visit: Payer: No Typology Code available for payment source | Attending: Family Medicine | Admitting: Physical Therapy

## 2020-05-21 DIAGNOSIS — M25531 Pain in right wrist: Secondary | ICD-10-CM | POA: Insufficient documentation

## 2020-05-21 DIAGNOSIS — M25532 Pain in left wrist: Secondary | ICD-10-CM | POA: Diagnosis present

## 2020-05-21 DIAGNOSIS — M6281 Muscle weakness (generalized): Secondary | ICD-10-CM | POA: Insufficient documentation

## 2020-05-21 NOTE — Therapy (Signed)
[facility] [address] [place], [place], [address] Phone: [phone]   Fax:  [phone]  Physical Therapy Treatment  Patient Details  Name: Holly [name] MRN: [id number] Date of Birth: [birth year] Referring Provider (PT): Doreene Eland, MD   Encounter Date: 05/21/2020   PT End of Session - 05/21/20 1406    Visit Number 2    Number of Visits 6    Date for PT Re-Evaluation 06/17/20    PT Start Time 1400    PT Stop Time 1442    PT Time Calculation (min) 42 min    Activity Tolerance Patient tolerated treatment well    Behavior During Therapy [facility] for tasks assessed/performed           Past Medical History:  Diagnosis Date  . Eczema    Hx: of  . Pregnancy   . Pregnancy induced hypertension   . Seasonal allergies    Hx: of    Past Surgical History:  Procedure Laterality Date  . c-sectionx1    . CESAREAN SECTION    . CHOLECYSTECTOMY N/A 08/30/2012   Procedure: LAPAROSCOPIC CHOLECYSTECTOMY WITH INTRAOPERATIVE CHOLANGIOGRAM;  Surgeon: Shelly Rubenstein, MD;  Location: [facility] OR;  Service: General;  Laterality: N/A;  . NO PAST SURGERIES    . UPPER GI ENDOSCOPY     Hx: of    There were no vitals filed for this visit.   Subjective Assessment - 05/21/20 1404    Subjective " I've been working on my exercises at home, I feel like the N/T has calmed own, but the when I do get the N/T it occurs in the outside of the hand"    Currently in Pain? Yes    Pain Score 3     Pain Onset More than a month ago    Pain Frequency Intermittent    Aggravating Factors  twisting, pushing down with wrist and typing    Pain Relieving Factors topical cream              [facility] PT Assessment - 05/21/20 0001      Assessment   Medical Diagnosis Carpal tunnel syndrome, bilateral    Referring Provider (PT) Doreene Eland, MD                         [facility] Adult PT Treatment/Exercise - 05/21/20 0001      Exercises    Exercises Hand      Pilates   Pilates Reformer IASTMalongtheRcommonextensors/[email or web address]wrisextension/fexrstretch2x30ea.      Shoulder Exercises: Stretch   Other Shoulder Stretches wrist extension/ flexor stretch 2 x 30 ea.      Hand Exercises   Other Hand Exercises power grip using towel 1 x 10 hold 5 sec ea.      Wrist Exercises   Wrist Flexion Strengthening;10 reps   12   Bar Weights/Barbell (Wrist Flexion) 2 lbs    Wrist Extension Strengthening;Right   12 reps 2# x 2 seas   Bar Weights/Barbell (Wrist Extension) 2 lbs    Other wrist exercises Median nerve floss with arm out to side x 10 each    Other wrist exercises tendon glides x 10 holding ea position 2 seconds      Manual Therapy   Manual Therapy Soft tissue mobilization    Manual therapy comments skilled palpation and monitoring of pt throughout TPDN    Soft tissue mobilization IASTM along the R common extensors / supinator  Trigger Point Dry Needling - 05/21/20 0001    Consent Given? Yes    Education Handout Provided Yes    Muscles Treated Wrist/Hand Extensor carpi ulnaris;Extensor digitorum;Extensor carpi radialis longus/brevis    Other Dry Needling common extensor group and supinator on R                PT Education - 05/21/20 1444    Education Details reviewed HEp and updated today to include tendon glides. muscle anatomy and referral patterns, what TPDN is and benefits.    Person(s) Educated Patient    Methods Explanation;Verbal cues;Handout    Comprehension Verbalized understanding;Verbal cues required            PT Short Term Goals - 05/06/20 1600      PT SHORT TERM GOAL #1   Title Patient will be I with HEP to progress with PT    Time 3    Period Weeks    Status New    Target Date 05/27/20      PT SHORT TERM GOAL #2   Title PT will review FOTO with patient by 3rd visit    Time 3    Period Weeks    Status New    Target Date 05/27/20      PT  SHORT TERM GOAL #3   Title Patient will report wirst pain </= 3/10 with working > 1 hour to improve work tolerance    Time 3    Period Weeks    Status New    Target Date 05/27/20             PT Long Term Goals - 05/06/20 1601      PT LONG TERM GOAL #1   Title Patient will be I with final HEP to maintain progress from PT    Time 6    Period Weeks    Status New    Target Date 06/17/20      PT LONG TERM GOAL #2   Title Patient will report improved functional status >/= 61% on FOTO    Time 6    Period Weeks    Status New    Target Date 06/17/20      PT LONG TERM GOAL #3   Title Patient will demonstrate improve grip strength >/= 45 lbs bilaterally to improve ability to open a jar    Time 6    Period Weeks    Status New    Target Date 06/17/20      PT LONG TERM GOAL #4   Title Patient will exhibit bilateral wrist strength 5/5 MMT to improve tolerance for typing extended periods    Time 6    Period Weeks    Status New    Target Date 06/17/20      PT LONG TERM GOAL #5   Title Patient will be able to demo proper seated posture for typing and </= 2/10 pain while working >1 hour to reduce functional limitation    Time 6    Period Weeks    Status New    Target Date 06/17/20                 Plan - 05/21/20 1445    Clinical Impression Statement pt arrived reporting consistency with her HEP and additionally noted mild relief of N/T but continued wrist pain. Educated and consent was provided for TPDN focusing on the common extensors and supinator on the R which pt noted reporoduced her concordant symptoms.  Followed up with IASTM techniques which she reported decreased pain / stiffness. continued wrist stretching and strengthening including wrist tendon glides to her HEP. end of session she noted soreness from TPDN but reported no increase in pain.    PT Treatment/Interventions ADLs/Self Care Home Management;Cryotherapy;Electrical Stimulation;Iontophoresis 4mg /ml  Dexamethasone;Moist Heat;Ultrasound;Neuromuscular re-education;Therapeutic exercise;Therapeutic activities;Functional mobility training;Patient/family education;Manual techniques;Dry needling;Passive range of motion;Taping;Joint Manipulations    PT Next Visit Plan Review HEP and progress PRN, median nerve glides, manual for wrist/forearm PRN, continues wrist/forearm/grip strengthening, postural control           Patient will benefit from skilled therapeutic intervention in order to improve the following deficits and impairments:  Postural dysfunction,Decreased strength,Pain,Decreased range of motion  Visit Diagnosis: Pain in right wrist  Pain in left wrist  Muscle weakness (generalized)     Problem List Patient Active Problem List   Diagnosis Date Noted  . HLD (hyperlipidemia) 04/28/2020  . LAD (lymphadenopathy), axillary 04/10/2020  . Carpal tunnel syndrome, bilateral 02/18/2020  . Toe pain, right 02/18/2020  . IUD (intrauterine device) in place 11/21/2019  . Seasonal allergies 10/27/2019  . Eczema 10/27/2019  . Diabetes mellitus without complication (HCC) 08/21/2019  . Asthma 02/18/2016  . 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  . HTN (hypertension) 09/30/2012  . S/P cholecystectomy 08/29/2012  . Obesity 04/15/2012   04/17/2012 PT, DPT, LAT, ATC  05/21/20  2:51 PM      [facility] [address] [place], [place], [place] Phone: [phone]   Fax:  [phone]  Name: Holly [name] MRN: Sharman Cheek Date of Birth: [birth year]

## 2020-05-24 ENCOUNTER — Ambulatory Visit: Payer: No Typology Code available for payment source | Admitting: Family Medicine

## 2020-05-24 ENCOUNTER — Other Ambulatory Visit: Payer: Self-pay | Admitting: Family Medicine

## 2020-05-24 DIAGNOSIS — I10 Essential (primary) hypertension: Secondary | ICD-10-CM

## 2020-05-26 ENCOUNTER — Other Ambulatory Visit: Payer: Self-pay

## 2020-05-26 ENCOUNTER — Ambulatory Visit (INDEPENDENT_AMBULATORY_CARE_PROVIDER_SITE_OTHER): Payer: No Typology Code available for payment source | Admitting: Family Medicine

## 2020-05-26 ENCOUNTER — Encounter: Payer: Self-pay | Admitting: Family Medicine

## 2020-05-26 VITALS — BP 128/82 | HR 89 | Ht 65.0 in | Wt 304.6 lb

## 2020-05-26 DIAGNOSIS — K625 Hemorrhage of anus and rectum: Secondary | ICD-10-CM

## 2020-05-26 MED ORDER — HYDROCORTISONE (PERIANAL) 2.5 % EX CREA
1.0000 | TOPICAL_CREAM | Freq: Two times a day (BID) | CUTANEOUS | 2 refills | Status: DC
Start: 2020-05-26 — End: 2020-08-04

## 2020-05-26 NOTE — Patient Instructions (Signed)
Thank you for coming in to see [place] today! Please see below to review our plan for today's visit:  1. Use Anusol twice daily in and around the anal area to decrease and bleeding due to hemorrhoids.  2. If the bleeding worsens please let us know. If it gets too bad please go be seen in the Emergency Department.  Please call the clinic at [phone] if your symptoms worsen or you have any concerns. It was our pleasure to serve you!   Dr. Peggyann Shoals [facility]

## 2020-05-26 NOTE — Progress Notes (Signed)
    SUBJECTIVE:   CHIEF COMPLAINT / HPI:   Mucus and blood in stool:  Patient presents to clinic being bright red blood that is occasionally occurring per rectum which started on Monday (this is day 3 of illness).  She reports she has been having some diarrhea.  She reports her stools are painless and the bleeding is painless, as well.  She does have a history of hemorrhoids that started when she was pregnant/giving birth many years ago.  She has a history of cholecystectomy.  She denies any nausea, vomiting, constipation, weight loss, abdominal pain, dietary changes, greasy foods.  Patient still has her appendix.  Patient is not taking any ibuprofen or steroids.  There is no personal or family history of Crohn's disease or ulcerative colitis.  Health maintenance: Patient would benefit from Hep C screening, Pneumonia vaccine, and Eye exam  PERTINENT  PMH / PSH:  Patient Active Problem List   Diagnosis Date Noted  . HLD (hyperlipidemia) 04/28/2020  . LAD (lymphadenopathy), axillary 04/10/2020  . Carpal tunnel syndrome, bilateral 02/18/2020  . Toe pain, right 02/18/2020  . IUD (intrauterine device) in place 11/21/2019  . Seasonal allergies 10/27/2019  . Eczema 10/27/2019  . Diabetes mellitus without complication (HCC) 08/21/2019  . Asthma 02/18/2016  . 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  . HTN (hypertension) 09/30/2012  . S/P cholecystectomy 08/29/2012  . Obesity 04/15/2012  . Rectal bleed 05/11/2011     OBJECTIVE:   BP 128/82   Pulse 89   Ht 5\' 5"  (1.651 m)   Wt (!) 304 lb 9.6 oz (138.2 kg)   SpO2 97%   BMI 50.69 kg/m    Physical exam: General: Well-appearing patient, nontoxic-appearing Respiratory: CTA bilaterally, comfortable work of breathing Cardio: RRR, S1-S2 present, no murmurs appreciated Abdomen: Normal bowel sounds appreciated, soft, nontender, no masses Rectum/anus: 2 large skin tags appreciated to anal opening without evidence of fissure or active bleed; internal  hemorrhoids appreciated without active bleed.  No frank blood appreciated  Anoscopy performed at today's visit shows hemorrhoids without active bleed. Hemoccult test in the office: Negative   ASSESSMENT/PLAN:   Rectal bleed Patient has a history of rectal bleed secondary to hemorrhoids, internal hemorrhoids appreciated on anoscopy.  No fissure appreciated on physical exam.  Patient well-appearing, vital stable, no concern at this time for significant bleed/acute blood loss anemia.  Fecal occult test done in the office negative for blood. -Patient given Anusol topical to help shrink hemorrhoids and reduce bleed     , DO [facility]

## 2020-05-27 ENCOUNTER — Ambulatory Visit: Payer: No Typology Code available for payment source | Admitting: Physical Therapy

## 2020-05-27 LAB — HEMOCCULT GUIAC POC 1CARD (OFFICE): Fecal Occult Blood, POC: NEGATIVE

## 2020-05-28 ENCOUNTER — Telehealth: Payer: Self-pay | Admitting: Physical Therapy

## 2020-05-28 NOTE — Assessment & Plan Note (Signed)
Patient has a history of rectal bleed secondary to hemorrhoids, internal hemorrhoids appreciated on anoscopy.  No fissure appreciated on physical exam.  Patient well-appearing, vital stable, no concern at this time for significant bleed/acute blood loss anemia.  Fecal occult test done in the office negative for blood. -Patient given Anusol topical to help shrink hemorrhoids and reduce bleed

## 2020-05-28 NOTE — Telephone Encounter (Signed)
Contacted patient due to missed PT appointment. Patient stated she tried to call and leave a message. Patient reminded of next scheduled appointment and attendance policy. Patient expressed understanding.   Rosana Hoes, PT, DPT, LAT, ATC 05/28/20  10:31 AM Phone: [phone] Fax: [phone]

## 2020-05-31 NOTE — Progress Notes (Deleted)
    SUBJECTIVE:   CHIEF COMPLAINT / HPI: BP f/u   HTN  Patient presents for BP follow up. She currently takes ***   PERTINENT  PMH / PSH: ***  OBJECTIVE:   There were no vitals taken for this visit.  ***  ASSESSMENT/PLAN:   No problem-specific Assessment & Plan notes found for this encounter.     Ronnald Ramp, MD [facility]

## 2020-06-01 ENCOUNTER — Ambulatory Visit: Payer: No Typology Code available for payment source | Admitting: Family Medicine

## 2020-06-03 ENCOUNTER — Encounter: Payer: Self-pay | Admitting: Physical Therapy

## 2020-06-03 ENCOUNTER — Other Ambulatory Visit: Payer: Self-pay

## 2020-06-03 ENCOUNTER — Ambulatory Visit: Payer: No Typology Code available for payment source | Admitting: Physical Therapy

## 2020-06-03 DIAGNOSIS — M25532 Pain in left wrist: Secondary | ICD-10-CM

## 2020-06-03 DIAGNOSIS — M6281 Muscle weakness (generalized): Secondary | ICD-10-CM

## 2020-06-03 DIAGNOSIS — M25531 Pain in right wrist: Secondary | ICD-10-CM

## 2020-06-03 NOTE — Progress Notes (Signed)
Spoke with pt regarding her NCV/EMG results. She has moderate-severe carpal tunnel. Dr. Pearletha Forge recommends referral to the hand surgeon to discuss carpal tunnel release.  Pt agrees with this plan.  Due to her insurance we will fax referral to [facility] to see Dr. Amanda Pea or Dr. Melvyn Novas. They will contact her to schedule an appt.

## 2020-06-03 NOTE — Therapy (Signed)
[facility] [address] [place], [place], [address] Phone: [phone]   Fax:  [phone]  Physical Therapy Treatment  Patient Details  Name: Holly [name] MRN: [id number] Date of Birth: 08-21-1980 Referring Provider (PT): Doreene Eland, MD   Encounter Date: 06/03/2020   PT End of Session - 06/03/20 1703    Visit Number 3    Number of Visits 6    Date for PT Re-Evaluation 06/17/20    Authorization Type [employer]    PT Start Time 1700    PT Stop Time 1740    PT Time Calculation (min) 40 min    Activity Tolerance Patient tolerated treatment well    Behavior During Therapy [facility] for tasks assessed/performed           Past Medical History:  Diagnosis Date  . Eczema    Hx: of  . Pregnancy   . Pregnancy induced hypertension   . Seasonal allergies    Hx: of    Past Surgical History:  Procedure Laterality Date  . c-sectionx1    . CESAREAN SECTION    . CHOLECYSTECTOMY N/A 08/30/2012   Procedure: LAPAROSCOPIC CHOLECYSTECTOMY WITH INTRAOPERATIVE CHOLANGIOGRAM;  Surgeon: Shelly Rubenstein, MD;  Location: [facility] OR;  Service: General;  Laterality: N/A;  . NO PAST SURGERIES    . UPPER GI ENDOSCOPY     Hx: of    There were no vitals filed for this visit.   Subjective Assessment - 06/03/20 1702    Subjective Patient reports continued pain but not as much.    Patient Stated Goals Be able to avoid wrists getting worse and work without pain    Currently in Pain? Yes    Pain Score 5     Pain Location Wrist    Pain Orientation Right;Left    Pain Descriptors / Indicators Dull    Pain Type Chronic pain    Pain Onset More than a month ago    Pain Frequency Intermittent              [facility] PT Assessment - 06/03/20 0001      Assessment   Medical Diagnosis Carpal tunnel syndrome, bilateral    Referring Provider (PT) Doreene Eland, MD      Observation/Other Assessments   Focus on Therapeutic Outcomes (FOTO)   40% functional status                         [facility] Adult PT Treatment/Exercise - 06/03/20 0001      Exercises   Exercises Hand;Wrist;Elbow      Elbow Exercises   Bar Weights/Barbell (Forearm Supination) 1 lb    Forearm Supination Limitations 2 x 10    Bar Weights/Barbell (Forearm Pronation) 1 lb    Forearm Pronation Limitations 2 x 10      Shoulder Exercises: Seated   External Rotation 15 reps   2 sets   Theraband Level (Shoulder External Rotation) Level 1 (Yellow)    External Rotation Limitations double ER+ scap retraction      Wrist Exercises   Bar Weights/Barbell (Wrist Flexion) 3 lbs    Wrist Flexion Limitations 2 x 12    Bar Weights/Barbell (Wrist Extension) 3 lbs    Wrist Extension Limitations 2 x 12    Bar Weights/Barbell (Radial Deviation) 1 lb   mallet   Wrist Radial Deviation Limitations 2 x 10 arm by side    Bar Weights/Barbell (Ulnar Deviation)  1 lb   mallet   Wrist Ulnar Deviation Limitations 2 x 10 arm by side    Other wrist exercises Median nerve floss with arm out to side x 10 each    Other wrist exercises Tendon glides 10 x 2 sec each position      Manual Therapy   Manual therapy comments Skilled palpation and monitoring of patient throughout TPDN            Trigger Point Dry Needling - 06/03/20 0001    Consent Given? Yes    Education Handout Provided Previously provided    Muscles Treated Wrist/Hand Extensor digitorum   Supinator   Other Dry Needling common extensor group and supinator on R    Extensor digitorum Response Twitch response elicited;Palpable increased muscle length                PT Education - 06/03/20 1703    Education Details HEP, FOTO    Person(s) Educated Patient    Methods Explanation    Comprehension Verbalized understanding;Need further instruction            PT Short Term Goals - 06/03/20 1705      PT SHORT TERM GOAL #1   Title Patient will be I with HEP to progress with PT    Time 3    Period  Weeks    Status On-going    Target Date 05/27/20      PT SHORT TERM GOAL #2   Title PT will review FOTO with patient by 3rd visit    Time 3    Period Weeks    Status Achieved    Target Date 05/27/20      PT SHORT TERM GOAL #3   Title Patient will report wirst pain </= 3/10 with working > 1 hour to improve work tolerance    Time 3    Period Weeks    Status On-going    Target Date 05/27/20             PT Long Term Goals - 05/06/20 1601      PT LONG TERM GOAL #1   Title Patient will be I with final HEP to maintain progress from PT    Time 6    Period Weeks    Status New    Target Date 06/17/20      PT LONG TERM GOAL #2   Title Patient will report improved functional status >/= 61% on FOTO    Time 6    Period Weeks    Status New    Target Date 06/17/20      PT LONG TERM GOAL #3   Title Patient will demonstrate improve grip strength >/= 45 lbs bilaterally to improve ability to open a jar    Time 6    Period Weeks    Status New    Target Date 06/17/20      PT LONG TERM GOAL #4   Title Patient will exhibit bilateral wrist strength 5/5 MMT to improve tolerance for typing extended periods    Time 6    Period Weeks    Status New    Target Date 06/17/20      PT LONG TERM GOAL #5   Title Patient will be able to demo proper seated posture for typing and </= 2/10 pain while working >1 hour to reduce functional limitation    Time 6    Period Weeks    Status New  Target Date 06/17/20                 Plan - 06/03/20 1704    Clinical Impression Statement Patient tolerated therapy well with no adverse effects. Patient subjectively reports improvement in symptoms but completed FOTO with decrease in functional level. Continued with dry needling this visit as patient reports this being beneficial. Progressed wrist/forearm and postural strenthening with good tolerance. Patient did report minimal wrist discomfort with exercise but this resolved at completion of  exercise. Patient is awaiting scheduling for surgical consult for carpal tunnel release. She would benefit from continued skilled PT to progress motion, strength, and tolerance for desk work to reduce pain and maximize functional level.    PT Treatment/Interventions ADLs/Self Care Home Management;Cryotherapy;Electrical Stimulation;Iontophoresis 4mg /ml Dexamethasone;Moist Heat;Ultrasound;Neuromuscular re-education;Therapeutic exercise;Therapeutic activities;Functional mobility training;Patient/family education;Manual techniques;Dry needling;Passive range of motion;Taping;Joint Manipulations    PT Next Visit Plan Review HEP and progress PRN, median nerve glides, manual for wrist/forearm PRN, continues wrist/forearm/grip strengthening, postural control    PT Home Exercise Plan [email or web address]    Consulted and Agree with Plan of Care Patient           Patient will benefit from skilled therapeutic intervention in order to improve the following deficits and impairments:  Postural dysfunction,Decreased strength,Pain,Decreased range of motion  Visit Diagnosis: Pain in right wrist  Pain in left wrist  Muscle weakness (generalized)     Problem List Patient Active Problem List   Diagnosis Date Noted  . HLD (hyperlipidemia) 04/28/2020  . LAD (lymphadenopathy), axillary 04/10/2020  . Carpal tunnel syndrome, bilateral 02/18/2020  . Toe pain, right 02/18/2020  . IUD (intrauterine device) in place 11/21/2019  . Seasonal allergies 10/27/2019  . Eczema 10/27/2019  . Diabetes mellitus without complication (HCC) 08/21/2019  . Asthma 02/18/2016  . 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  . HTN (hypertension) 09/30/2012  . S/P cholecystectomy 08/29/2012  . Obesity 04/15/2012  . Rectal bleed 05/11/2011    05/13/2011, PT, DPT, LAT, ATC 06/03/20  5:51 PM Phone: [phone] Fax: [phone]   [facility] [address] [place], [place], [place] Phone:  [phone]   Fax:  [phone]  Name: Holly [name] MRN: Sharman Cheek Date of Birth: 04/06/1981

## 2020-06-08 ENCOUNTER — Other Ambulatory Visit: Payer: Self-pay | Admitting: Family Medicine

## 2020-06-08 DIAGNOSIS — I10 Essential (primary) hypertension: Secondary | ICD-10-CM

## 2020-06-10 ENCOUNTER — Other Ambulatory Visit: Payer: Self-pay

## 2020-06-10 ENCOUNTER — Encounter: Payer: Self-pay | Admitting: Family Medicine

## 2020-06-10 ENCOUNTER — Encounter: Payer: Self-pay | Admitting: Physical Therapy

## 2020-06-10 ENCOUNTER — Ambulatory Visit: Payer: No Typology Code available for payment source | Admitting: Physical Therapy

## 2020-06-10 DIAGNOSIS — M25531 Pain in right wrist: Secondary | ICD-10-CM

## 2020-06-10 DIAGNOSIS — M25532 Pain in left wrist: Secondary | ICD-10-CM

## 2020-06-10 DIAGNOSIS — M6281 Muscle weakness (generalized): Secondary | ICD-10-CM

## 2020-06-10 NOTE — Therapy (Signed)
[place], [place], [address] Phone: [phone]   Fax:  [phone]  Physical Therapy Treatment / Discharge  Patient Details  Name: Holly [name] MRN: [id number] Date of Birth: 04/12/1981 Referring Provider (PT): Kinnie Feil, MD   Encounter Date: 06/10/2020   PT End of Session - 06/10/20 1708    Visit Number 4    Number of Visits 6    Date for PT Re-Evaluation 06/17/20    Authorization Type [employer]    PT Start Time 1707    PT Stop Time 1745    PT Time Calculation (min) 38 min    Activity Tolerance Patient tolerated treatment well    Behavior During Therapy [facility] for tasks assessed/performed           Past Medical History:  Diagnosis Date  . Eczema    Hx: of  . Pregnancy   . Pregnancy induced hypertension   . Seasonal allergies    Hx: of    Past Surgical History:  Procedure Laterality Date  . c-sectionx1    . CESAREAN SECTION    . CHOLECYSTECTOMY N/A 08/30/2012   Procedure: LAPAROSCOPIC CHOLECYSTECTOMY WITH INTRAOPERATIVE CHOLANGIOGRAM;  Surgeon: Harl Bowie, MD;  Location: [facility];  Service: General;  Laterality: N/A;  . NO PAST SURGERIES    . UPPER GI ENDOSCOPY     Hx: of    There were no vitals filed for this visit.   Subjective Assessment - 06/10/20 1732    Subjective Patient reports she is doing well, she is improving but still having pain.    Patient Stated Goals Be able to avoid wrists getting worse and work without pain    Currently in Pain? Yes    Pain Score 3     Pain Location Wrist    Pain Orientation Right;Left    Pain Descriptors / Indicators Dull    Pain Type Chronic pain    Pain Onset More than a month ago    Pain Frequency Intermittent              [facility] PT Assessment - 06/10/20 0001      Assessment   Medical Diagnosis Carpal tunnel syndrome, bilateral    Referring Provider (PT) Kinnie Feil, MD    Hand Dominance Right    Next MD  Visit 08/09/2020   neurosurgeon for carpal tunnel release     Precautions   Precautions None      Restrictions   Weight Bearing Restrictions No      Balance Screen   Has the patient fallen in the past 6 months No      Prior Function   Level of Independence Independent      Cognition   Overall Cognitive Status Within Functional Limits for tasks assessed      Observation/Other Assessments   Focus on Therapeutic Outcomes (FOTO)  40% functional status   assessed 06/03/2020     Sensation   Light Touch Appears Intact      Strength   Right Wrist Flexion 5/5    Right Wrist Extension 5/5    Right Wrist Radial Deviation 5/5    Right Wrist Ulnar Deviation 5/5    Left Wrist Flexion 5/5    Left Wrist Extension 5/5    Left Wrist Radial Deviation 5/5    Left Wrist Ulnar Deviation 5/5    Right Hand Grip (lbs) 42, 45, 40    Left Hand Grip (lbs)  48, 45, 50                         [facility] Adult PT Treatment/Exercise - 06/10/20 0001      Exercises   Exercises Hand;Wrist;Elbow      Elbow Exercises   Bar Weights/Barbell (Forearm Supination) 2 lbs   mallet   Forearm Supination Limitations 2 x 10    Bar Weights/Barbell (Forearm Pronation) 2 lbs   mallet   Forearm Pronation Limitations 2 x 10      Shoulder Exercises: Standing   External Rotation 15 reps   2 sets   Theraband Level (Shoulder External Rotation) Level 2 (Red)    External Rotation Limitations double ER + scap retraction    Row 15 reps   2 sets   Theraband Level (Shoulder Row) Level 3 (Green)      Shoulder Exercises: ROM/Strengthening   UBE (Upper Arm Bike) L2 x 4 min (2 fwd/bwd)      Hand Exercises   Other Hand Exercises Digi-Flex 2 x 10   5.0 lbs     Wrist Exercises   Bar Weights/Barbell (Wrist Flexion) 3 lbs    Wrist Flexion Limitations 2 x 15    Bar Weights/Barbell (Wrist Extension) 3 lbs    Wrist Extension Limitations 2 x 15    Bar Weights/Barbell (Radial Deviation) 2 lbs   mallet   Wrist Radial  Deviation Limitations 2 x 10 arm by side    Bar Weights/Barbell (Ulnar Deviation) 2 lbs   mallet   Wrist Ulnar Deviation Limitations 2 x 10 arm by side    Other wrist exercises Median nerve floss with arm out to side x 10 each    Other wrist exercises Tendon glides 10 x 2 sec each position      Manual Therapy   Manual therapy comments Skilled palpation and monitoring of patient throughout TPDN            Trigger Point Dry Needling - 06/10/20 0001    Consent Given? Yes    Education Handout Provided Previously provided    Muscles Treated Wrist/Hand Extensor digitorum   Supinator   Other Dry Needling bilateral common extensor group and supinator on R    Extensor digitorum Response Twitch response elicited;Palpable increased muscle length         Performed by Carlus Pavlov, PT,DPT       PT Education - 06/10/20 1708    Education Details HEP update    Person(s) Educated Patient    Methods Explanation;Demonstration;Verbal cues;Handout    Comprehension Verbalized understanding;Need further instruction;Returned demonstration;Verbal cues required            PT Short Term Goals - 06/10/20 1750      PT SHORT TERM GOAL #1   Title Patient will be I with HEP to progress with PT    Time 3    Period Weeks    Status Achieved    Target Date 05/27/20      PT SHORT TERM GOAL #2   Title PT will review FOTO with patient by 3rd visit    Time 3    Period Weeks    Status Achieved    Target Date 05/27/20      PT SHORT TERM GOAL #3   Title Patient will report wirst pain </= 3/10 with working > 1 hour to improve work tolerance    Time 3    Period Weeks    Status Achieved  Target Date 05/27/20             PT Long Term Goals - 06/10/20 1750      PT LONG TERM GOAL #1   Title Patient will be I with final HEP to maintain progress from PT    Time 6    Period Weeks    Status Achieved      PT LONG TERM GOAL #2   Title Patient will report improved functional status >/= 61% on  FOTO    Time 6    Period Weeks    Status Not Met      PT LONG TERM GOAL #3   Title Patient will demonstrate improve grip strength >/= 45 lbs bilaterally to improve ability to open a jar    Time 6    Period Weeks    Status Partially Met      PT LONG TERM GOAL #4   Title Patient will exhibit bilateral wrist strength 5/5 MMT to improve tolerance for typing extended periods    Time 6    Period Weeks    Status Achieved      PT LONG TERM GOAL #5   Title Patient will be able to demo proper seated posture for typing and </= 2/10 pain while working >1 hour to reduce functional limitation    Time 6    Period Weeks    Status Partially Met                 Plan - 06/10/20 1708    Clinical Impression Statement Patient tolerated therapy well with no adverse effects. She continues to report benefit with dry needling so performed bilaterally this visit. Continued focus on wrist/forarm strengthening and postural control with good tolerance. Patient with no increased pain this visit but does note fatigue with exercise. Patient has scheduled surgical consult for 08/09/2020 and has elected to place PT on hold until after that visit. She will continue with her HEP but will be discharged from PT, she was instructed about re-evaluation when she returns and expressed understanding.    PT Treatment/Interventions ADLs/Self Care Home Management;Cryotherapy;Electrical Stimulation;Iontophoresis 4mg /ml Dexamethasone;Moist Heat;Ultrasound;Neuromuscular re-education;Therapeutic exercise;Therapeutic activities;Functional mobility training;Patient/family education;Manual techniques;Dry needling;Passive range of motion;Taping;Joint Manipulations    PT Next Visit Plan NA - discharge    PT [place] and Agree with Plan of Care Patient           Patient will benefit from skilled therapeutic intervention in order to improve the following deficits and impairments:  Postural  dysfunction,Decreased strength,Pain,Decreased range of motion  Visit Diagnosis: Pain in right wrist  Pain in left wrist  Muscle weakness (generalized)     Problem List Patient Active Problem List   Diagnosis Date Noted  . HLD (hyperlipidemia) 04/28/2020  . LAD (lymphadenopathy), axillary 04/10/2020  . Carpal tunnel syndrome, bilateral 02/18/2020  . Toe pain, right 02/18/2020  . IUD (intrauterine device) in place 11/21/2019  . Seasonal allergies 10/27/2019  . Eczema 10/27/2019  . Diabetes mellitus without complication ([place]) 16/01/9603  . Asthma 02/18/2016  . 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  . HTN (hypertension) 09/30/2012  . S/P cholecystectomy 08/29/2012  . Obesity 04/15/2012  . Rectal bleed 05/11/2011    Hilda Blades, PT, DPT, LAT, ATC 06/10/20  5:57 PM Phone: [phone] Fax: [facility] [address] [place], [place], [address] Phone: [phone]   Fax:  [phone]  Name: Holly [name] MRN: [id number] Date of  Birth: 28-Jul-1980   PHYSICAL THERAPY DISCHARGE SUMMARY  Visits from Start of Care: 4  Current functional level related to goals / functional outcomes: See above   Remaining deficits: See above   Education / Equipment: HEP Plan: Patient agrees to discharge.  Patient goals were partially met. Patient is being discharged due to the patient's request.  ?????

## 2020-06-10 NOTE — Patient Instructions (Signed)
Access Code: [email or web address] URL: [email or web address] Date: 06/10/2020 Prepared by: Rosana Hoes  Exercises Seated Scapular Retraction - 10 reps - 5 seconds hold Seated Wrist Extension with Dumbbell - 1-2 x daily - 7 x weekly - 2 sets - 10-15 reps Seated Wrist Flexion with Dumbbell - 1-2 x daily - 7 x weekly - 2 sets - 10-15 reps Seated Wrist Ulnar Deviation with Dumbbell - 1-2 x daily - 7 x weekly - 2 sets - 10-15 reps Seated Wrist Radial Deviation with Dumbbell - 1-2 x daily - 7 x weekly - 2 sets - 10-15 reps Median Nerve Flossing - Tray - 2-3 x daily - 7 x weekly - 10 reps Wrist Tendon Gliding - 2-3 x daily - 7 x weekly - 2 sets - 10 reps Standing Bilateral Low Shoulder Row with Anchored Resistance - 1 x daily - 7 x weekly - 2 sets - 15 reps Shoulder External Rotation and Scapular Retraction with Resistance - 1 x daily - 7 x weekly - 2 sets - 15 reps

## 2020-06-17 NOTE — Patient Instructions (Signed)
It was great to see you! Thank you for allowing me to participate in your care!  Our plans for today:  -Your blood pressure was still little bit elevated today at 135/82.  Since you just add back your blood pressure medicine last night I think it would be worth checking pressures at home before we make a change in your medication. -I would like for you to check your blood pressures at least once per day for the next 7 days and send me a MyChart message including these numbers.  If we need to make adjustments to your medication we can likely do this over the phone.  If these numbers continue to be elevated I may request that you come back in for a follow-up appointment but I believe that likely they will be improved after being on your medications. -For your cough, nausea, diarrhea I think this is likely related to a stomach bug and should improve in the next 1 to 2 days.  If you develop any high fevers, vomiting to the extent that she cannot keep down fluids, shortness of breath, chest pain, or any other concerning symptoms please let us know immediately or go to the emergency department.  If your symptoms do not resolve in the next 2 to 3 days please let us know.   Take care and seek immediate care sooner if you develop any concerns.   Dr. Jackelyn Poling, DO [facility]

## 2020-06-17 NOTE — Progress Notes (Signed)
SUBJECTIVE:   CHIEF COMPLAINT / HPI:   Follow-up-hypertension: Patient is a 40 year old female who presents today for follow-up on hypertension.  Patient has not been taking her blood pressure medicine at home because she ran out.  She did restart it last night.  She continues on amlodipine 10 mg and at her last appointment increased her hydrochlorothiazide to 50 mg daily.  Blood pressure today of 135/82.  She does have a blood pressure cuff at the house.  Stomach bug Some nausea and diarrhea for last 1-2 days, no fevers but has had chills for 1 day before. No blood in stool. Has had some cough but lower appetite for the last couple of days. This all started about 1 day after her cleanse diet began. She has since stopped the cleanse diet.   PERTINENT  PMH / PSH: HTN  OBJECTIVE:   BP 135/82   Pulse 94   Ht 5\' 5"  (1.651 m)   Wt (!) 308 lb 12.8 oz (140.1 kg)   SpO2 98%   BMI 51.39 kg/m    General: NAD, pleasant, able to participate in exam Cardiac: RRR, no murmurs. Respiratory: CTAB, normal effort Psych: Normal affect and mood  ASSESSMENT/PLAN:   HTN (hypertension) 70-year-old female continuing on amlodipine and hydrochlorothiazide which was recently increased at a previous appointment due to hypertension.  She unfortunately ran out of the hydrochlorothiazide and has not started it back until last night.  Her blood pressure is mildly elevated at 135/82.  She does have a blood pressure cuff at the house. Plan: -Patient will check her blood pressures at the house for the next 1 week. -I reiterated proper blood pressure measurement technique. -Patient will send me a MyChart message with these blood pressure numbers and we will determine if we need to make additional modification to her blood pressure regimen. -Recommended follow-up in 2 months.  Gastroenteritis Assessment: 40 year old female with nausea, diarrhea, no fevers but has had some chills, as well as a cough and low  appetite for the past 3 days.  She does not believe that she is made any dramatic changes other than recently starting a cleanse diet a few days before this began.  She has since stopped the cleanse diet but continues to have some diarrhea and nausea.  She has not vomited.  She does have a cough.  She is fully vaccinated against Covid.  Overall differential is likely viral gastroenteritis as she has had an ongoing diarrhea and nausea and this will likely improve in the next 1 to 2 days.  She is able to maintain hydration.  She is not having concerning symptoms or physical exam findings.  Other possible etiologies could be diarrhea from her change in diet with a cleanse diet, however I expect for this to resolve in the next 1 to 2 days as well as gastroenteritis with.  COVID-19 infection is also on the differential despite the patient being fully vaccinated. Plan: -I did recommend she doing at home Covid test as this could explain her symptoms -I explained that if this is gastroenteritis that should improve in the next 1 to 2 days and should be fully resolved within the next 1 week. -I recommend the patient follow-up in 1 week if her symptoms have not resolved -I did discuss return precautions including ED precautions in case patient's symptoms worsen    40, DO [facility]    This note was prepared using Dragon voice recognition software and  may include unintentional dictation errors due to the inherent limitations of voice recognition software.

## 2020-06-18 ENCOUNTER — Encounter: Payer: Self-pay | Admitting: Family Medicine

## 2020-06-18 ENCOUNTER — Other Ambulatory Visit: Payer: Self-pay

## 2020-06-18 ENCOUNTER — Ambulatory Visit (INDEPENDENT_AMBULATORY_CARE_PROVIDER_SITE_OTHER): Payer: No Typology Code available for payment source | Admitting: Family Medicine

## 2020-06-18 DIAGNOSIS — K529 Noninfective gastroenteritis and colitis, unspecified: Secondary | ICD-10-CM | POA: Diagnosis not present

## 2020-06-18 DIAGNOSIS — I10 Essential (primary) hypertension: Secondary | ICD-10-CM

## 2020-06-18 NOTE — Assessment & Plan Note (Signed)
Assessment: 40 year old female with nausea, diarrhea, no fevers but has had some chills, as well as a cough and low appetite for the past 3 days.  She does not believe that she is made any dramatic changes other than recently starting a cleanse diet a few days before this began.  She has since stopped the cleanse diet but continues to have some diarrhea and nausea.  She has not vomited.  She does have a cough.  She is fully vaccinated against Covid.  Overall differential is likely viral gastroenteritis as she has had an ongoing diarrhea and nausea and this will likely improve in the next 1 to 2 days.  She is able to maintain hydration.  She is not having concerning symptoms or physical exam findings.  Other possible etiologies could be diarrhea from her change in diet with a cleanse diet, however I expect for this to resolve in the next 1 to 2 days as well as gastroenteritis with.  COVID-19 infection is also on the differential despite the patient being fully vaccinated. Plan: -I did recommend she doing at home Covid test as this could explain her symptoms -I explained that if this is gastroenteritis that should improve in the next 1 to 2 days and should be fully resolved within the next 1 week. -I recommend the patient follow-up in 1 week if her symptoms have not resolved -I did discuss return precautions including ED precautions in case patient's symptoms worsen

## 2020-06-18 NOTE — Assessment & Plan Note (Signed)
40-year-old female continuing on amlodipine and hydrochlorothiazide which was recently increased at a previous appointment due to hypertension.  She unfortunately ran out of the hydrochlorothiazide and has not started it back until last night.  Her blood pressure is mildly elevated at 135/82.  She does have a blood pressure cuff at the house. Plan: -Patient will check her blood pressures at the house for the next 1 week. -I reiterated proper blood pressure measurement technique. -Patient will send me a MyChart message with these blood pressure numbers and we will determine if we need to make additional modification to her blood pressure regimen. -Recommended follow-up in 2 months.

## 2020-06-21 ENCOUNTER — Other Ambulatory Visit: Payer: Self-pay

## 2020-06-21 DIAGNOSIS — I10 Essential (primary) hypertension: Secondary | ICD-10-CM

## 2020-06-21 MED ORDER — AMLODIPINE BESYLATE 10 MG PO TABS: 10.0000 mg | ORAL_TABLET | Freq: Every day | ORAL | 1 refills | Status: DC

## 2020-06-25 ENCOUNTER — Ambulatory Visit: Payer: No Typology Code available for payment source | Admitting: Family Medicine

## 2020-07-16 ENCOUNTER — Other Ambulatory Visit: Payer: Self-pay

## 2020-07-16 ENCOUNTER — Ambulatory Visit (INDEPENDENT_AMBULATORY_CARE_PROVIDER_SITE_OTHER): Payer: No Typology Code available for payment source | Admitting: Family Medicine

## 2020-07-16 ENCOUNTER — Encounter: Payer: Self-pay | Admitting: Family Medicine

## 2020-07-16 VITALS — BP 136/82 | HR 89 | Wt 314.4 lb

## 2020-07-16 DIAGNOSIS — L0390 Cellulitis, unspecified: Secondary | ICD-10-CM

## 2020-07-16 DIAGNOSIS — L02221 Furuncle of abdominal wall: Secondary | ICD-10-CM

## 2020-07-16 DIAGNOSIS — L02219 Cutaneous abscess of trunk, unspecified: Secondary | ICD-10-CM | POA: Diagnosis not present

## 2020-07-16 MED ORDER — SULFAMETHOXAZOLE-TRIMETHOPRIM 800-160 MG PO TABS: 1.0000 | ORAL_TABLET | Freq: Two times a day (BID) | ORAL | 0 refills | Status: AC

## 2020-07-16 NOTE — Patient Instructions (Signed)
Thank you for coming to see me today. I  If you see any signs of increased drainage, develop fevers or worsening redness at the site please call me or go to the ED for assessment.  Keep dressing on for 24 hours.  After this change daily until drainage decreases  Please follow-up with me in 2 weeks  If you have any questions or concerns, please do not hesitate to call the office at [phone].  Best,   Dana Allan, MD    Skin Abscess  A skin abscess is an infected area on or under your skin that contains a collection of pus and other material. An abscess may also be called a furuncle, carbuncle, or boil. An abscess can occur in or on almost any part of your body. Some abscesses break open (rupture) on their own. Most continue to get worse unless they are treated. The infection can spread deeper into the body and eventually into your blood, which can make you feel ill. Treatment usually involves draining the abscess. What are the causes? An abscess occurs when germs, like bacteria, pass through your skin and cause an infection. This may be caused by:  A scrape or cut on your skin.  A puncture wound through your skin, including a needle injection or insect bite.  Blocked oil or sweat glands.  Blocked and infected hair follicles.  A cyst that forms beneath your skin (sebaceous cyst) and becomes infected. What increases the risk? This condition is more likely to develop in people who:  Have a weak body defense system (immune system).  Have diabetes.  Have dry and irritated skin.  Get frequent injections or use illegal IV drugs.  Have a foreign body in a wound, such as a splinter.  Have problems with their lymph system or veins. What are the signs or symptoms? Symptoms of this condition include:  A painful, firm bump under the skin.  A bump with pus at the top. This may break through the skin and drain. Other symptoms include:  Redness surrounding the abscess  site.  Warmth.  Swelling of the lymph nodes (glands) near the abscess.  Tenderness.  A sore on the skin. How is this diagnosed? This condition may be diagnosed based on:  A physical exam.  Your medical history.  A sample of pus. This may be used to find out what is causing the infection.  Blood tests.  Imaging tests, such as an ultrasound, CT scan, or MRI. How is this treated? A small abscess that drains on its own may not need treatment. Treatment for larger abscesses may include:  Moist heat or heat pack applied to the area several times a day.  A procedure to drain the abscess (incision and drainage).  Antibiotic medicines. For a severe abscess, you may first get antibiotics through an IV and then change to antibiotics by mouth. Follow these instructions at home: Medicines  Take over-the-counter and prescription medicines only as told by your health care provider.  If you were prescribed an antibiotic medicine, take it as told by your health care provider. Do not stop taking the antibiotic even if you start to feel better.   Abscess care  If you have an abscess that has not drained, apply heat to the affected area. Use the heat source that your health care provider recommends, such as a moist heat pack or a heating pad. ? Place a towel between your skin and the heat source. ? Leave the heat on  for 20-30 minutes. ? Remove the heat if your skin turns bright red. This is especially important if you are unable to feel pain, heat, or cold. You may have a greater risk of getting burned.  Follow instructions from your health care provider about how to take care of your abscess. Make sure you: ? Cover the abscess with a bandage (dressing). ? Change your dressing or gauze as told by your health care provider. ? Wash your hands with soap and water before you change the dressing or gauze. If soap and water are not available, use hand sanitizer.  Check your abscess every day for  signs of a worsening infection. Check for: ? More redness, swelling, or pain. ? More fluid or blood. ? Warmth. ? More pus or a bad smell.   General instructions  To avoid spreading the infection: ? Do not share personal care items, towels, or hot tubs with others. ? Avoid making skin contact with other people.  Keep all follow-up visits as told by your health care provider. This is important. Contact a health care provider if you have:  More redness, swelling, or pain around your abscess.  More fluid or blood coming from your abscess.  Warm skin around your abscess.  More pus or a bad smell coming from your abscess.  A fever.  Muscle aches.  Chills or a general ill feeling. Get help right away if you:  Have severe pain.  See red streaks on your skin spreading away from the abscess. Summary  A skin abscess is an infected area on or under your skin that contains a collection of pus and other material.  A small abscess that drains on its own may not need treatment.  Treatment for larger abscesses may include having a procedure to drain the abscess and taking an antibiotic. This information is not intended to replace advice given to you by your health care provider. Make sure you discuss any questions you have with your health care provider. Document Revised: 07/25/2018 Document Reviewed: 05/17/2017 Elsevier Patient Education  2021 [employer].

## 2020-07-16 NOTE — Progress Notes (Signed)
    SUBJECTIVE:   CHIEF COMPLAINT / HPI: pain in abdomen   Abdominal pain Reports abdominal pain for 1 week.  Was stretching and felt a pain that has been progressively getting worse.  This morning notices a bulge on her left lower abdomen that was purple/blue and was concerned that it was a hernia.  Denies any fevers, vomiting, bloody stool, diarrhea,constipation or urinary symptoms.  No decrease in appetite and hydrating well.  Endorses some nausea.  Currently has IUD in place.  PERTINENT  PMH / PSH:  DM Type 2 Elevated BMI  OBJECTIVE:   BP 136/82   Pulse 89   Wt (!) 314 lb 6.4 oz (142.6 kg)   SpO2 98%   BMI 52.32 kg/m    General: Alert, no acute distress Abdomen: Bowel sounds normal. Abdomen soft and non-tender. No rebound tenderness or peritoneal signs noted. Derm: large soft white fluid filled area noted to left lower quadrant with surrounding erythema and induration.  Tender to touch.    ASSESSMENT/PLAN:   Furuncle of abdominal wall Diagnosis: Infected Furuncle - Location:Left lower abdomen Procedure: Incision & drainage Informed consent:  Discussed risks  (infection, pain, bleeding, bruising, numbness, and recurrence of the condition) and benefits of the procedure, as well as the alternatives.  Informed consent was obtained. The area was prepared and draped in a standard fashion and liquid nitrogen was used for numbing agent.   The lesion drained pus.  Tylenol for discomfort. The patient tolerated the procedure well. The patient was instructed on dressing and incision care. Wound culture sent Bactrim DS BID x 14/7 Strict return precautions provided Follow up appointment scheduled for April 19     Dana Allan, MD [facility]

## 2020-07-17 ENCOUNTER — Encounter: Payer: Self-pay | Admitting: Family Medicine

## 2020-07-17 DIAGNOSIS — L02221 Furuncle of abdominal wall: Secondary | ICD-10-CM | POA: Insufficient documentation

## 2020-07-17 DIAGNOSIS — L0390 Cellulitis, unspecified: Secondary | ICD-10-CM | POA: Insufficient documentation

## 2020-07-17 NOTE — Assessment & Plan Note (Addendum)
Diagnosis: Infected Furuncle - Location:Left lower abdomen Procedure: Incision & drainage Informed consent:  Discussed risks  (infection, pain, bleeding, bruising, numbness, and recurrence of the condition) and benefits of the procedure, as well as the alternatives.  Informed consent was obtained. The area was prepared and draped in a standard fashion and liquid nitrogen was used for numbing agent.   The lesion drained pus.  Tylenol for discomfort. The patient tolerated the procedure well. The patient was instructed on dressing and incision care. Wound culture sent Bactrim DS BID x 14/7 Strict return precautions provided Follow up appointment scheduled for April 19

## 2020-07-19 NOTE — Progress Notes (Signed)
Patient called.  Left message for patient.  Will attempt second call at later time.  Dana Allan, MD [facility] Res[facility]

## 2020-07-20 LAB — WOUND CULTURE

## 2020-07-21 ENCOUNTER — Telehealth: Payer: Self-pay | Admitting: Family Medicine

## 2020-07-21 NOTE — Telephone Encounter (Signed)
Follow up call to discuss results of wound culture. Results showed heavy growth mixed skin flora.  Patient afebrile and reports still having some drainage but site is improving.  Cleaning with H2O2 and reports area still slightly reddened. Tolerating Bactrim.  Advise to stop using H2O2 and cleanse with warm soap and water. Continue full course antibiotics given still draining and history of DM Follow up on April 19 as scheduled. Strict  RTC precautions provided  Dana Allan, MD Family Medicine Res[facility]

## 2020-08-03 ENCOUNTER — Encounter: Payer: Self-pay | Admitting: Family Medicine

## 2020-08-03 ENCOUNTER — Other Ambulatory Visit: Payer: Self-pay

## 2020-08-03 ENCOUNTER — Ambulatory Visit (INDEPENDENT_AMBULATORY_CARE_PROVIDER_SITE_OTHER): Payer: No Typology Code available for payment source | Admitting: Family Medicine

## 2020-08-03 VITALS — BP 148/90 | HR 99 | Wt 311.4 lb

## 2020-08-03 DIAGNOSIS — E119 Type 2 diabetes mellitus without complications: Secondary | ICD-10-CM | POA: Diagnosis not present

## 2020-08-03 DIAGNOSIS — L02221 Furuncle of abdominal wall: Secondary | ICD-10-CM

## 2020-08-03 LAB — GLUCOSE, POCT (MANUAL RESULT ENTRY): POC Glucose: 500 mg/dl — AB (ref 70–99)

## 2020-08-03 LAB — POCT GLYCOSYLATED HEMOGLOBIN (HGB A1C): HbA1c, POC (controlled diabetic range): 11.9 % — AB (ref 0.0–7.0)

## 2020-08-03 MED ORDER — METFORMIN HCL ER 500 MG PO TB24
500.0000 mg | ORAL_TABLET | Freq: Every day | ORAL | 3 refills | Status: DC
Start: 2020-08-03 — End: 2021-05-20

## 2020-08-03 NOTE — Patient Instructions (Addendum)
Thank you for coming to see me today. It was a pleasure.   We will get some labs today.  If they are abnormal or we need to do something about them, I will call you.  If they are normal, I will send you a message on MyChart (if it is active) or a letter in the mail.  If you don't hear from [place] in 2 weeks, please call the office at the number below.  You should pay attention to your hemoglobin A1C.  It is a three month test about your average blood sugar. If the A1C is - <7.0 is great.  That is our goal for treating you. - Between 7.0 and 9.0 is not so good.  We would need to work to do better. - Above 9.0 is terrible.  You would really need to work with [place] to get it under control.    Today's A1C = 11.9  Start Metformin 500 mg daily.  Can increase to twice a day after 1 week   Please follow-up with PCP in 1 week  If you have any questions or concerns, please do not hesitate to call the office at [phone].  Best,   Dana Allan, MD

## 2020-08-03 NOTE — Progress Notes (Signed)
    SUBJECTIVE:   CHIEF COMPLAINT / HPI: follow up I&D  Presents for follow up for I&D of left abdominal abscess. Seen in clinic on 04/01 and treated with 14 days Bactrim.  Completed course of therapy. Since then patient reports improvement in symptoms. Still having slight amount of serous drainage intermittently.   Diabetes Not taking medications as previously prescribes as not affordable.  Denies any polydipsia, polyuria, abdominal pain or nausea or vomiting.  Was working on diet and exercise and had lost some weight when initially advised of previous A1c 6.9.  Does not want to be on long term medications.  PERTINENT  PMH / PSH:  Elevated BMI Type 2 DM   OBJECTIVE:   BP (!) 148/90   Pulse 99   Wt (!) 311 lb 6.4 oz (141.3 kg)   SpO2 98%   BMI 51.82 kg/m    General: Alert, no acute distress Derm: incision healing well, no erythema, drainage or induration noted   ASSESSMENT/PLAN:   Furuncle of abdominal wall Area healed.  No drainage.  Completed course of antibiotics -Follow up as needed  Diabetes mellitus without complication (HCC) A1c 11.9 today.  Increased from 6.9 12/21.  Has not been taking Trulicity as not affordable.  Denies any symptoms of polyuria, polydipsia.  Discussed initiation of insulin today.  Patient would prefer not to start injections.  We also discussed starting oral medications today and looking into starting Ozempic.   -CBG 500 -Bmet and CBC today -Start Metformin XR 500 mg daily, can increase to twice daily if tolerate after 1 week -Pending lab results, may need ED evaluation for IV insulin and fluids, patient aware, will call when result of labs in -Strict return precautions provided -Follow up appointment scheduled 1/52      Dana Allan, MD [facility]

## 2020-08-04 ENCOUNTER — Telehealth: Payer: Self-pay | Admitting: Family Medicine

## 2020-08-04 ENCOUNTER — Other Ambulatory Visit: Payer: Self-pay

## 2020-08-04 ENCOUNTER — Ambulatory Visit (INDEPENDENT_AMBULATORY_CARE_PROVIDER_SITE_OTHER): Payer: No Typology Code available for payment source | Admitting: Pharmacist

## 2020-08-04 ENCOUNTER — Encounter: Payer: Self-pay | Admitting: Family Medicine

## 2020-08-04 DIAGNOSIS — E119 Type 2 diabetes mellitus without complications: Secondary | ICD-10-CM | POA: Diagnosis not present

## 2020-08-04 LAB — BASIC METABOLIC PANEL
BUN/Creatinine Ratio: 13 (ref 9–23)
BUN: 11 mg/dL (ref 6–24)
CO2: 20 mmol/L (ref 20–29)
Calcium: 9.3 mg/dL (ref 8.7–10.2)
Chloride: 89 mmol/L — ABNORMAL LOW (ref 96–106)
Creatinine, Ser: 0.83 mg/dL (ref 0.57–1.00)
Glucose: 507 mg/dL (ref 65–99)
Potassium: 3.8 mmol/L (ref 3.5–5.2)
Sodium: 130 mmol/L — ABNORMAL LOW (ref 134–144)
eGFR: 91 mL/min/{1.73_m2} (ref >59)

## 2020-08-04 NOTE — Progress Notes (Signed)
Subjective:    Patient ID: Holly [name], female    DOB: 03-11-1981, 40 y.o.   MRN: [id number]  HPI Patient is a 40 y.o. female who presents for diabetes management. She is in good spirits and presents without assistance. Patient was last seen by provider, Dr. Clent Ridges, yesterday, and was referred to clinic today after recommendation to present to ED and patient declining. Per Dr. Manson Passey, patient to start long-acting insulin in-office.  Insurance coverage/medication affordability: Nurse, learning disability  Current diabetes medications include: metformin 500mg  Current hypertension medications include: HCTZ 50mg , amlodipine 10mg  Current hyperlipidemia medications include: none Patient states that She is taking her diabetes medications as prescribed. Patient reports adherence with medications. Patient states that she does not miss doses of her medications  Does you feel that your medications are working for you?  Has not begun initiation of insulin  Have you been experiencing any side effects to the medications prescribed? yes  Do you have any problems obtaining medications due to transportation or finances?  Yes; cost of Trulicity   Patient reported dietary habits:  Eats 3 meals/day and 1 snacks/day Breakfast: boiled eggs + avocado Lunch: smoothies Dinner: salad or grilled chicken + veggies Snacks: nuts Drinks: water; previously drinking juice but now has plans to stop Patient states she has been working to improve her diet since realizing how much her A1C has increased.    Patient denies hypoglycemic events. Patient reports polyuria (increased urination).  Patient denies polyphagia (increased appetite).  Patient denies polydipsia (increased thirst).  Patient denies neuropathy (nerve pain). Patient denies visual changes. Patient reports self foot exams.   Home fasting blood sugars: patient cannot locate meter but plans to check after new meter sent in to pharmacy  Objective:   Labs:    Lab Results  Component Value Date   HGBA1C 11.9 (A) 08/03/2020   HGBA1C 6.9 (A) 04/01/2020   HGBA1C 6.4 11/21/2019    Lab Results  Component Value Date   MICRALBCREAT 30-300 08/20/2019    Lipid Panel     Component Value Date/Time   CHOL 180 04/01/2020 1652   TRIG 146 04/01/2020 1652   HDL 33 (L) 04/01/2020 1652   CHOLHDL 5.5 (H) 04/01/2020 1652   CHOLHDL 5.4 (H) 08/05/2015 1408   VLDL 26 08/05/2015 1408   LDLCALC 121 (H) 04/01/2020 1652    Clinical Atherosclerotic Cardiovascular Disease (ASCVD): No  The 10-year ASCVD risk score 08/07/2015 DC Jr., et al., 2013) is: 22%   Values used to calculate the score:     Age: 47 years     Sex: Female     Is Non-Hispanic African American: Yes     Diabetic: Yes     Tobacco smoker: No     Systolic Blood Pressure: 148 mmHg     Is BP treated: Yes     HDL Cholesterol: 33 mg/dL     Total Cholesterol: 180 mg/dL   PHQ-9 Score: did not complete  Assessment/Plan:   T2DM is not controlled likely due to changes in lifestyle (diet/exercise) that were likely previously controlling blood glucose without medication. Medication adherence appears optimal. Additional pharmacotherapy is warranted. Will initiate insulin degludec 04/03/2020) 14 units once daily (0.1 unit/kg) in office with sample and then will send in prescription to pharmacy to verify coverage. Will follow-up with pharmacy and send in alternative long-acting insulin based if preferred. Patient educated on purpose, proper use and potential adverse effects of Tresiba. Following instruction patient verbalized understanding of treatment plan. Patient successfully administered  first dose of [place] during office visit.  1. Started basal insulin degludec Evaristo Bury) 14 units once daily. Patient will continue to titrate 2 unit every 3 days if fasting blood sugar > 200mg /dl until follow-up with provider next week. 2. Follow-up with pharmacy to check coverage of previously sent in Trulicity prescription  as patient stated she could not afford medication. 3. Extensively discussed pathophysiology of diabetes, dietary effects on blood sugar control, and recommended lifestyle interventions. 4. Counseled on s/sx of and management of hypoglycemia 5. Next A1C anticipated 11/02/20.   This appointment required 60 minutes of patient care (this includes precharting, chart review, review of results, and face-to-face care).  Thank you for involving pharmacy to assist in providing this patient's care.  Medication Samples have been provided to the patient.  Drug name: insulin degludec 11/04/20)      Strength: 100 units/mL        Qty: 3 mL (1 pen)  LOT: Evaristo Bury  Exp.Date: 07/15/2021  Dosing instructions: inject 14 units once daily in the morning and increase by 2 units every 3 days if fasting blood sugar >200 until follow-up with provider.  The patient has been instructed regarding the correct time, dose, and frequency of taking this medication, including desired effects and most common side effects.   07/17/2021 2:18 PM 08/04/2020

## 2020-08-04 NOTE — Telephone Encounter (Signed)
Patient calls nurse line regarding missed call from provider. Advised patient of critical results and recommended ED evaluation for assessment of DKA. Patient reports that she is unable to leave work at this time. Patient is currently asymptomatic.   Spoke with Dr. Manson Passey regarding patient. Per Dr. Manson Passey, scheduled patient is pharmacy clinic with Dr. Raymondo Band for further medication management. However, first available appointment is not until next Thursday 4/28.   Text paged Dr. Clent Ridges at this [phone] for further instructions. Patient provided with strict ED precautions and verbalized understanding.   Patient can be reached at [phone].   Veronda Prude, RN

## 2020-08-04 NOTE — Telephone Encounter (Signed)
Called patient, declined ED evaluation. She is entirely asymptomatic.   Discussed, scheduled for 130 PM  today with Dr. Nicholaus Bloom to start long acting insulin.   Terisa Starr, MD  Family Medicine Teaching Service

## 2020-08-04 NOTE — Patient Instructions (Signed)
Mr./Ms. Dede it was a pleasure seeing you today.   Please do the following:  1. START Tresiba 14 units once daily in the morning and increase by 2 units every 3 days if blood sugar >200 as directed today during your appointment. If you have any questions or if you believe something has occurred because of this change, call me or your doctor to let one of [place] know.  2. Continue checking blood sugars at home. It's really important that you record these and bring these in to your next doctor's appointment.  3. Continue making the lifestyle changes we've discussed together during our visit. Diet and exercise play a significant role in improving your blood sugars.  4. Follow-up with PCP at appt next week.   Hypoglycemia or low blood sugar:   Low blood sugar can happen quickly and may become an emergency if not treated right away.   While this shouldn't happen often, it can be brought upon if you skip a meal or do not eat enough. Also, if your insulin or other diabetes medications are dosed too high, this can cause your blood sugar to go to low.   Warning signs of low blood sugar include: 1. Feeling shaky or dizzy 2. Feeling weak or tired  3. Excessive hunger 4. Feeling anxious or upset  5. Sweating even when you aren't exercising  What to do if I experience low blood sugar? Follow the Rule of 15 1. Check your blood sugar with your meter. If lower than 70, proceed to step 2.  2. Treat with 15 grams of fast acting carbs which is found in 3-4 glucose tablets. If none are available you can try hard candy, 1 tablespoon of sugar or honey,4 ounces of fruit juice, or 6 ounces of REGULAR soda.  3. Re-check your sugar in 15 minutes. If it is still below 70, do what you did in step 2 again. If your blood sugar has come back up, go ahead and eat a snack or small meal made up of complex carbs (ex. Whole grains) and protein at this time to avoid recurrence of low blood sugar.

## 2020-08-04 NOTE — Assessment & Plan Note (Addendum)
A1c 11.9 today.  Increased from 6.9 12/21.  Has not been taking Trulicity as not affordable.  Denies any symptoms of polyuria, polydipsia.  Discussed initiation of insulin today.  Patient would prefer not to start injections.  We also discussed starting oral medications today and looking into starting Ozempic.   -CBG 500 -Bmet and CBC today -Start Metformin XR 500 mg daily, can increase to twice daily if tolerate after 1 week -Pending lab results, may need ED evaluation for IV insulin and fluids, patient aware, will call when result of labs in -Strict return precautions provided -Follow up appointment scheduled 1/52

## 2020-08-04 NOTE — Assessment & Plan Note (Signed)
Area healed.  No drainage.  Completed course of antibiotics -Follow up as needed

## 2020-08-04 NOTE — Assessment & Plan Note (Signed)
>>  ASSESSMENT AND PLAN FOR DIABETES MELLITUS WITHOUT COMPLICATION (HCC) WRITTEN ON 08/04/2020  5:38 PM BY WALSH, TANYA, MD  A1c 11.9 today.  Increased from 6.9 12/21.  Has not been taking Trulicity  as not affordable.  Denies any symptoms of polyuria, polydipsia.  Discussed initiation of insulin  today.  Patient would prefer not to start injections.  We also discussed starting oral medications today and looking into starting Ozempic .   -CBG 500 -Bmet and CBC today -Start Metformin  XR 500 mg daily, can increase to twice daily if tolerate after 1 week -Pending lab results, may need ED evaluation for IV insulin  and fluids, patient aware, will call when result of labs in -Strict return precautions provided -Follow up appointment scheduled 1/52

## 2020-08-04 NOTE — Telephone Encounter (Signed)
Critical Lab Result  Received page from [facility] about critical lab result of glucose 507. BMP with Na 130, Cl 89, K 3.8, HCO3 20, cr 0.83. AG significantly elevated at 21.  Talked with Dr. Clent Ridges, who saw patient in clinic yesterday. Patient did not have symptoms yesterday, but had A1c go from 6.9% to 11.9%. She declined recommendations for insulin, increased metformin.   I called patient, she did not answer. I left a HIPAA compliant VM. Recommendation is to have patient go to ED for evaluation. She will likely need IVF and more labs to ensure she is not in DKA.   I informed FPTS day team and Dr. Clent Ridges, who will continue to try to contact patient.  Shirlean Mylar, MD [facility] Medicine Residency, PGY-2

## 2020-08-05 ENCOUNTER — Encounter: Payer: Self-pay | Admitting: Family Medicine

## 2020-08-05 MED ORDER — ACCU-CHEK GUIDE VI STRP: ORAL_STRIP | 12 refills | Status: AC

## 2020-08-05 MED ORDER — INSULIN GLARGINE 100 UNIT/ML ~~LOC~~ SOLN: 14.0000 [IU] | Freq: Every day | SUBCUTANEOUS | 11 refills | Status: DC

## 2020-08-05 MED ORDER — ACCU-CHEK SOFTCLIX LANCETS MISC: 12 refills | Status: DC

## 2020-08-05 MED ORDER — BD PEN NEEDLE NANO U/F 32G X 4 MM MISC: 1 refills | Status: DC

## 2020-08-05 MED ORDER — ACCU-CHEK SOFTCLIX LANCET DEV KIT: 1.0000 | PACK | Freq: Every day | 1 refills | Status: AC

## 2020-08-05 MED ORDER — INSULIN GLARGINE 100 UNIT/ML SOLOSTAR PEN: 14.0000 [IU] | PEN_INJECTOR | SUBCUTANEOUS | 0 refills | Status: DC

## 2020-08-05 MED ORDER — ACCU-CHEK GUIDE W/DEVICE KIT: PACK | 0 refills | Status: AC

## 2020-08-05 NOTE — Assessment & Plan Note (Signed)
T2DM is not controlled likely due to changes in lifestyle (diet/exercise) that were likely previously controlling blood glucose without medication. Medication adherence appears optimal. Additional pharmacotherapy is warranted. Will initiate insulin degludec Evaristo Bury) 14 units once daily (0.1 unit/kg) in office with sample and then will send in prescription to pharmacy to verify coverage. Will follow-up with pharmacy and send in alternative long-acting insulin based if preferred. Patient educated on purpose, proper use and potential adverse effects of Tresiba. Following instruction patient verbalized understanding of treatment plan. Patient successfully administered first dose of Tresiba during office visit.  1. Started basal insulin degludec Evaristo Bury) 14 units once daily. Patient will continue to titrate 2 unit every 3 days if fasting blood sugar > 200mg /dl until follow-up with provider next week. 2. Follow-up with pharmacy to check coverage of previously sent in Trulicity prescription as patient stated she could not afford medication. 3. Extensively discussed pathophysiology of diabetes, dietary effects on blood sugar control, and recommended lifestyle interventions. 4. Counseled on s/sx of and management of hypoglycemia 5. Next A1C anticipated 11/02/20.

## 2020-08-05 NOTE — Assessment & Plan Note (Signed)
>>  ASSESSMENT AND PLAN FOR DIABETES MELLITUS WITHOUT COMPLICATION (HCC) WRITTEN ON 08/05/2020  9:38 AM BY KELLEY, RACHELLE, RPH  T2DM is not controlled likely due to changes in lifestyle (diet/exercise) that were likely previously controlling blood glucose without medication. Medication adherence appears optimal. Additional pharmacotherapy is warranted. Will initiate insulin  degludec [place]) 14 units once daily (0.1 unit/kg) in office with sample and then will send in prescription to pharmacy to verify coverage. Will follow-up with pharmacy and send in alternative long-acting insulin  based if preferred. Patient educated on purpose, proper use and potential adverse effects of Tresiba. Following instruction patient verbalized understanding of treatment plan. Patient successfully administered first dose of Tresiba during office visit.  Started basal insulin  degludec (Tresiba) 14 units once daily. Patient will continue to titrate 2 unit every 3 days if fasting blood sugar > 200mg /dl until follow-up with provider next week. Follow-up with pharmacy to check coverage of previously sent in Trulicity  prescription as patient stated she could not afford medication. Extensively discussed pathophysiology of diabetes, dietary effects on blood sugar control, and recommended lifestyle interventions. Counseled on s/sx of and management of hypoglycemia Next A1C anticipated 11/02/20.

## 2020-08-06 ENCOUNTER — Telehealth: Payer: Self-pay | Admitting: Pharmacist

## 2020-08-06 NOTE — Telephone Encounter (Signed)
I sent in a prescription for Semglee (insulin glargine-yfgn) as this is a biosimilar to Lantus and is often times less expensive as cost to patient for insulin was still high despite coupon. I followed up with pharmacy and provided coupon for [facility] and with coupon brought cost of prescription down to $0.  I called patient to inform her of this and to also inform her that the prices of her other prescriptions are in a message I sent to her mychart. Patient states she had not looked at the message yet but that she will. Instructed patient to call back if any further issues.

## 2020-08-12 ENCOUNTER — Ambulatory Visit: Payer: No Typology Code available for payment source | Admitting: Pharmacist

## 2020-08-12 NOTE — Progress Notes (Deleted)
No show

## 2020-08-13 ENCOUNTER — Ambulatory Visit: Payer: No Typology Code available for payment source | Admitting: Family Medicine

## 2020-09-21 ENCOUNTER — Ambulatory Visit: Payer: No Typology Code available for payment source | Admitting: Family Medicine

## 2020-09-21 NOTE — Progress Notes (Signed)
    SUBJECTIVE:   CHIEF COMPLAINT / HPI: DM follow up   Diabetes  Last A1c:  Lab Results  Component Value Date   HGBA1C 11.9 (A) 08/03/2020   Denies polyuria, polydipsia, hypoglycemia Last Eye Exam: she is working with her insurance to find an eye doctor who will accept her insurance   Has been increasing by two units every three days for BG >200, she reports that she is now up to 20 units  CBGs are in the lower 200s and upper 100s both pre and post prandial  Has been trying to drink a gallon of water per day  She has increased her physical activity  She has also changed her diet to include low carb meals, more protein, no sugary beverages    PERTINENT  PMH / PSH:  DM   OBJECTIVE:   BP 125/80   Pulse 81   Ht 5\' 5"  (1.651 m)   Wt (!) 313 lb (142 kg)   SpO2 98%   BMI 52.09 kg/m   General: female appearing stated age in no acute distress Cardio: Normal S1 and S2, no S3 or S4. Rhythm is regular. No murmurs or rubs.  Bilateral radial pulses palpable Pulm: Clear to auscultation bilaterally, no crackles, wheezing, or diminished breath sounds. Normal respiratory effort Abdomen: Bowel sounds normal. Abdomen soft and non-tender.  Extremities: trace peripheral edema. Warm/ well perfused.  Neuro: pt alert and oriented x4    ASSESSMENT/PLAN:   Diabetes mellitus without complication (HCC) Increased patient's insulin dose to 25 units  Instructed patient to return to titration regimen after 24 hours of new dose  Rx sent for insulin with new dose  Patient to continue metformin at current dose, reports 1-2 episodes of diarrhea per day so will not increase at this time      , MD [facility]

## 2020-09-22 ENCOUNTER — Encounter: Payer: Self-pay | Admitting: Family Medicine

## 2020-09-22 ENCOUNTER — Ambulatory Visit (INDEPENDENT_AMBULATORY_CARE_PROVIDER_SITE_OTHER): Payer: No Typology Code available for payment source | Admitting: Family Medicine

## 2020-09-22 ENCOUNTER — Other Ambulatory Visit: Payer: Self-pay

## 2020-09-22 ENCOUNTER — Ambulatory Visit: Payer: No Typology Code available for payment source | Admitting: Family Medicine

## 2020-09-22 DIAGNOSIS — E119 Type 2 diabetes mellitus without complications: Secondary | ICD-10-CM

## 2020-09-22 MED ORDER — INSULIN GLARGINE 100 UNIT/ML ~~LOC~~ SOLN: 25.0000 [IU] | Freq: Every day | SUBCUTANEOUS | 5 refills | Status: DC

## 2020-09-22 NOTE — Patient Instructions (Addendum)
You have been doing a good job with working to get your blood glucoses under control.   We will increase your insulin dose to 25 units tomorrow 09/23/20 and then you can return to your increasing regimen as you were doing previously.    I am happy to hear of all the progress you have been making.   It appears that this insurance prefers that you make the appointment with a physician of your choosing. Please let us know when you have been able to have your diabetes eye exam so that we can update your records.

## 2020-09-24 NOTE — Assessment & Plan Note (Signed)
Increased patient's insulin dose to 25 units  Instructed patient to return to titration regimen after 24 hours of new dose  Rx sent for insulin with new dose  Patient to continue metformin at current dose, reports 1-2 episodes of diarrhea per day so will not increase at this time

## 2020-09-24 NOTE — Assessment & Plan Note (Signed)
>>  ASSESSMENT AND PLAN FOR DIABETES MELLITUS WITHOUT COMPLICATION (HCC) WRITTEN ON 09/24/2020  8:39 AM BY MARCINE COYER, MD  Increased patient's insulin  dose to 25 units  Instructed patient to return to titration regimen after 24 hours of new dose  Rx sent for insulin  with new dose  Patient to continue metformin  at current dose, reports 1-2 episodes of diarrhea per day so will not increase at this time

## 2020-11-02 ENCOUNTER — Telehealth: Payer: Self-pay | Admitting: *Deleted

## 2020-11-02 ENCOUNTER — Ambulatory Visit: Payer: No Typology Code available for payment source | Admitting: Family Medicine

## 2020-11-02 NOTE — Telephone Encounter (Signed)
Patient called and requested a letter for her job providing documentation that she had an appt this am (11/02/20 @ 10:30am) but it was canceled.  Provided letter as requested.  Jone Baseman, CMA

## 2020-11-07 NOTE — Progress Notes (Signed)
    SUBJECTIVE:   CHIEF COMPLAINT / HPI: DM f/u   Diabetes Current Regimen: 30 units of insulin once per day in the AM,  CBGs: reports that the lowest it has been is 176 - max has been 206 PP  Last A1c:  Lab Results  Component Value Date   HGBA1C 9.6 (A) 11/08/2020    Denies polyuria, polydipsia, hypoglycemia  Reports she has been doing well with exercising. She reports having improved energy levels since exercising.  She reports that tried trulicity in the past but the copay was too expensive, she has also tried other agents that were very expensive   Ankle Swelling  Patient has noticed she has been having bilateral ankle swelling recently. She denies standing much other than when at work. She denies eating frozen meals. She has been trying to cook. She has been watching her sugar intake in condiments. She has been monitoring her NA levels.  Patient denies any pain in her feet associated with the swelling.  She has normal range of motion.   PERTINENT  PMH / PSH: Diabetes Hypertension  OBJECTIVE:   BP 118/80   Pulse 97   Ht 5\' 5"  (1.651 m)   Wt (!) 316 lb 12.8 oz (143.7 kg)   SpO2 95%   BMI 52.72 kg/m   General: female appearing stated age in no acute distress Cardio: Normal S1 and S2, no S3 or S4. Rhythm is regular. No murmurs or rubs.  Bilateral radial pulses palpable Pulm: Clear to auscultation bilaterally, no crackles, wheezing, or diminished breath sounds. Normal respiratory effort, stable on room air Abdomen: Bowel sounds normal. Abdomen soft and non-tender. Extremities: Bilateral lower extremity edema, 1+. Warm/ well perfused.    ASSESSMENT/PLAN:   Diabetes mellitus without complication (HCC) Patient continues to have elevated fasting blood glucose with lowest at 176.  Currently taking 30 units of glargine daily with metformin. Previously has tried Trulicity but patient reports co-pay was too expensive. Patient is agreeable to trying Ozempic and will notify our  office if this co-pay is too expensive so that we can try different agent. Patient has been modifying her diet and cooking majority of her meals to try to control glucose and sodium levels.  Encourage patient to continue with these efforts. Repeat hemoglobin A1c Prescribed Ozempic  Ankle swelling Patient reports bilateral ankle swelling that has been occurring as a new problem in the last few weeks.  Patient currently taking amlodipine 10 mg which we discussed is a potential culprit for lower extremity edema.  The swelling is not painful and does not limit the patient's ability to ambulate. Recommended compression stockings as much as patient can tolerate, patient voiced understanding Also recommended monitoring sodium intake to which patient responded she has been monitoring these levels and her dietary intake Recommended that patient frequently elevate her feet at the end of the day to help reduce swelling, patient was in agreement with this plan   Follow-up in 1 month, patient will need urine protein collected at this appointment  , MD [facility]

## 2020-11-08 ENCOUNTER — Ambulatory Visit (INDEPENDENT_AMBULATORY_CARE_PROVIDER_SITE_OTHER): Payer: No Typology Code available for payment source | Admitting: Family Medicine

## 2020-11-08 ENCOUNTER — Encounter: Payer: Self-pay | Admitting: Family Medicine

## 2020-11-08 ENCOUNTER — Other Ambulatory Visit: Payer: Self-pay

## 2020-11-08 VITALS — BP 118/80 | HR 97 | Ht 65.0 in | Wt 316.8 lb

## 2020-11-08 DIAGNOSIS — M25473 Effusion, unspecified ankle: Secondary | ICD-10-CM | POA: Insufficient documentation

## 2020-11-08 DIAGNOSIS — E119 Type 2 diabetes mellitus without complications: Secondary | ICD-10-CM

## 2020-11-08 LAB — POCT GLYCOSYLATED HEMOGLOBIN (HGB A1C): HbA1c, POC (controlled diabetic range): 9.6 % — AB (ref 0.0–7.0)

## 2020-11-08 MED ORDER — OZEMPIC (0.25 OR 0.5 MG/DOSE) 2 MG/1.5ML ~~LOC~~ SOPN: 0.5000 mg | PEN_INJECTOR | SUBCUTANEOUS | 3 refills | Status: DC

## 2020-11-08 NOTE — Patient Instructions (Signed)
For your diabetes, I recommend continue your metformin, insulin at 30 units and titrating as you have been as well as this new agent that I have prescribed called Ozempic.  Please let me know soon as possible if this is not affordable with your insurance plan and we will try a different agent.  For your ankle swelling, please try to wear compression stockings when you will be on your feet for long periods of time.  I also recommend elevating your feet and legs at the end of a long day to help decrease the swelling.  Continue to monitor your amount of sodium that you are taking it as this will help with swelling as well.  You are doing a great job with control your blood pressure so keep up the good work!  I will plan to see you in 4 weeks.  Please let me know if there is anything else we can do in the meantime  Dr. Roxan Hockey

## 2020-11-08 NOTE — Assessment & Plan Note (Signed)
>>  ASSESSMENT AND PLAN FOR DIABETES MELLITUS WITHOUT COMPLICATION (HCC) WRITTEN ON 11/08/2020  5:10 PM BY SIMMONS, MAKIERA, MD  Patient continues to have elevated fasting blood glucose with lowest at 176.  Currently taking 30 units of glargine daily with metformin . Previously has tried Trulicity  but patient reports co-pay was too expensive. Patient is agreeable to trying Ozempic  and will notify our office if this co-pay is too expensive so that we can try different agent. Patient has been modifying her diet and cooking majority of her meals to try to control glucose and sodium levels.  Encourage patient to continue with these efforts. Repeat hemoglobin A1c Prescribed Ozempic

## 2020-11-08 NOTE — Assessment & Plan Note (Signed)
Patient reports bilateral ankle swelling that has been occurring as a new problem in the last few weeks.  Patient currently taking amlodipine 10 mg which we discussed is a potential culprit for lower extremity edema.  The swelling is not painful and does not limit the patient's ability to ambulate. Recommended compression stockings as much as patient can tolerate, patient voiced understanding Also recommended monitoring sodium intake to which patient responded she has been monitoring these levels and her dietary intake Recommended that patient frequently elevate her feet at the end of the day to help reduce swelling, patient was in agreement with this plan

## 2020-11-08 NOTE — Assessment & Plan Note (Signed)
Patient continues to have elevated fasting blood glucose with lowest at 176.  Currently taking 30 units of glargine daily with metformin. Previously has tried Trulicity but patient reports co-pay was too expensive. Patient is agreeable to trying Ozempic and will notify our office if this co-pay is too expensive so that we can try different agent. Patient has been modifying her diet and cooking majority of her meals to try to control glucose and sodium levels.  Encourage patient to continue with these efforts. Repeat hemoglobin A1c Prescribed Ozempic

## 2020-12-16 ENCOUNTER — Encounter: Payer: Self-pay | Admitting: Family Medicine

## 2020-12-16 ENCOUNTER — Other Ambulatory Visit: Payer: Self-pay

## 2020-12-16 ENCOUNTER — Ambulatory Visit (INDEPENDENT_AMBULATORY_CARE_PROVIDER_SITE_OTHER): Payer: No Typology Code available for payment source | Admitting: Family Medicine

## 2020-12-16 VITALS — BP 127/79 | HR 97 | Wt 317.8 lb

## 2020-12-16 DIAGNOSIS — E119 Type 2 diabetes mellitus without complications: Secondary | ICD-10-CM

## 2020-12-16 DIAGNOSIS — I10 Essential (primary) hypertension: Secondary | ICD-10-CM

## 2020-12-16 MED ORDER — LIRAGLUTIDE 18 MG/3ML ~~LOC~~ SOPN: 0.6000 mg | PEN_INJECTOR | SUBCUTANEOUS | 3 refills | Status: DC

## 2020-12-16 NOTE — Progress Notes (Signed)
    SUBJECTIVE:   CHIEF COMPLAINT / HPI: DM  Patient reports Ozempic cost $2,000 and she was unable to afford that cost for 1 month. CBG ranged from 143 up to 174. She denies any hypoglycemia issues. She reports she has always had polyuria. She states that her diet has been consistent and has been working to keep up the healthy choices. She has been using more healthy sides like brussel sprouts, rotating shakes. She aims for a green vegetable, she prefers to bake meats. Her physical activity includes 1-2 days per week. She usually eats pickles an stringed cheese for a snack.   HTN  BP elevated today in office. She has not been measuring it at home. She reports adherence with her amlodipine and chlorthalidone. Denies presence of HA or chest pain today.   PERTINENT  PMH / PSH:  T2DM   OBJECTIVE:   BP 127/79   Pulse 97   Wt (!) 317 lb 12.8 oz (144.2 kg)   SpO2 95%   BMI 52.88 kg/m  (this is repeat BP check)  General: female appearing stated age in no acute distress Cardio: Normal S1 and S2, no S3 or S4. Rhythm is regular. No murmurs or rubs.  Bilateral radial pulses palpable Pulm: Clear to auscultation bilaterally, no crackles, wheezing, or diminished breath sounds. Normal respiratory effort, stable on RA Abdomen: Bowel sounds normal. Abdomen soft and non-tender.  Extremities: No peripheral edema. Warm/ well perfused.   ASSESSMENT/PLAN:   HTN (hypertension) BP improved on recheck measurement in office.  Continue current regimen   Diabetes mellitus without complication [facility]) Patient interested in trial with another GLP1 due to cost of previously prescribed option.  Will submit RX for Victoza for additional weight loss benefit Urine protein/creatinine ratio collected today      Ronnald Ramp, MD [facility]

## 2020-12-16 NOTE — Patient Instructions (Addendum)
Please continue your amlodipine and hydrochlorothiazide for your blood pressure.   I will prescribe the Victoza for you to use for weight loss and diabetes control. Please let our office know if this is not cost effective for your insurance.   Please follow  up with me as scheduled.

## 2020-12-17 LAB — PROTEIN / CREATININE RATIO, URINE
Creatinine, Urine: 58.3 mg/dL
Protein, Ur: 25.9 mg/dL
Protein/Creat Ratio: 444 mg/g creat — ABNORMAL HIGH (ref 0–200)

## 2020-12-21 NOTE — Assessment & Plan Note (Signed)
Patient interested in trial with another GLP1 due to cost of previously prescribed option.  Will submit RX for Victoza for additional weight loss benefit Urine protein/creatinine ratio collected today

## 2020-12-21 NOTE — Assessment & Plan Note (Signed)
>>  ASSESSMENT AND PLAN FOR DIABETES MELLITUS WITHOUT COMPLICATION (HCC) WRITTEN ON 12/21/2020  3:10 PM BY SIMMONS, MAKIERA, MD  Patient interested in trial with another GLP1 due to cost of previously prescribed option.  Will submit RX for Victoza  for additional weight loss benefit Urine protein/creatinine ratio collected today

## 2020-12-21 NOTE — Assessment & Plan Note (Signed)
BP improved on recheck measurement in office.  Continue current regimen

## 2020-12-27 ENCOUNTER — Emergency Department (HOSPITAL_COMMUNITY)
Admission: EM | Admit: 2020-12-27 | Discharge: 2020-12-28 | Disposition: A | Discharge status: Home / Self Care | Payer: No Typology Code available for payment source | Attending: Emergency Medicine | Admitting: Emergency Medicine

## 2020-12-27 ENCOUNTER — Emergency Department (HOSPITAL_COMMUNITY): Payer: No Typology Code available for payment source

## 2020-12-27 ENCOUNTER — Other Ambulatory Visit: Payer: Self-pay

## 2020-12-27 ENCOUNTER — Encounter (HOSPITAL_COMMUNITY): Payer: Self-pay | Admitting: Emergency Medicine

## 2020-12-27 DIAGNOSIS — Z7984 Long term (current) use of oral hypoglycemic drugs: Secondary | ICD-10-CM | POA: Diagnosis not present

## 2020-12-27 DIAGNOSIS — R0602 Shortness of breath: Secondary | ICD-10-CM | POA: Insufficient documentation

## 2020-12-27 DIAGNOSIS — Z794 Long term (current) use of insulin: Secondary | ICD-10-CM | POA: Diagnosis not present

## 2020-12-27 DIAGNOSIS — Z79899 Other long term (current) drug therapy: Secondary | ICD-10-CM | POA: Insufficient documentation

## 2020-12-27 DIAGNOSIS — Z20822 Contact with and (suspected) exposure to covid-19: Secondary | ICD-10-CM | POA: Insufficient documentation

## 2020-12-27 DIAGNOSIS — R059 Cough, unspecified: Secondary | ICD-10-CM | POA: Diagnosis not present

## 2020-12-27 DIAGNOSIS — Z7951 Long term (current) use of inhaled steroids: Secondary | ICD-10-CM | POA: Insufficient documentation

## 2020-12-27 DIAGNOSIS — R0981 Nasal congestion: Secondary | ICD-10-CM | POA: Insufficient documentation

## 2020-12-27 DIAGNOSIS — J45909 Unspecified asthma, uncomplicated: Secondary | ICD-10-CM | POA: Diagnosis not present

## 2020-12-27 DIAGNOSIS — E119 Type 2 diabetes mellitus without complications: Secondary | ICD-10-CM | POA: Diagnosis not present

## 2020-12-27 DIAGNOSIS — R110 Nausea: Secondary | ICD-10-CM | POA: Diagnosis not present

## 2020-12-27 DIAGNOSIS — I10 Essential (primary) hypertension: Secondary | ICD-10-CM | POA: Insufficient documentation

## 2020-12-27 LAB — CBC WITH DIFFERENTIAL/PLATELET
Abs Immature Granulocytes: 0.05 10*3/uL (ref 0.00–0.07)
Basophils Absolute: 0.1 10*3/uL (ref 0.0–0.1)
Basophils Relative: 0 %
Eosinophils Absolute: 0.1 10*3/uL (ref 0.0–0.5)
Eosinophils Relative: 1 %
HCT: 42.5 % (ref 36.0–46.0)
Hemoglobin: 13.8 g/dL (ref 12.0–15.0)
Immature Granulocytes: 0 %
Lymphocytes Relative: 29 %
Lymphs Abs: 3.5 10*3/uL (ref 0.7–4.0)
MCH: 27.5 pg (ref 26.0–34.0)
MCHC: 32.5 g/dL (ref 30.0–36.0)
MCV: 84.7 fL (ref 80.0–100.0)
Monocytes Absolute: 0.5 10*3/uL (ref 0.1–1.0)
Monocytes Relative: 4 %
Neutro Abs: 7.8 10*3/uL — ABNORMAL HIGH (ref 1.7–7.7)
Neutrophils Relative %: 66 %
Platelets: 416 10*3/uL — ABNORMAL HIGH (ref 150–400)
RBC: 5.02 MIL/uL (ref 3.87–5.11)
RDW: 13.4 % (ref 11.5–15.5)
WBC: 12.1 10*3/uL — ABNORMAL HIGH (ref 4.0–10.5)
nRBC: 0 % (ref 0.0–0.2)

## 2020-12-27 LAB — BASIC METABOLIC PANEL
Anion gap: 10 (ref 5–15)
BUN: 9 mg/dL (ref 6–20)
CO2: 29 mmol/L (ref 22–32)
Calcium: 9.5 mg/dL (ref 8.9–10.3)
Chloride: 99 mmol/L (ref 98–111)
Creatinine, Ser: 0.64 mg/dL (ref 0.44–1.00)
GFR, Estimated: >60 mL/min (ref >60)
Glucose, Bld: 318 mg/dL — ABNORMAL HIGH (ref 70–99)
Potassium: 4.4 mmol/L (ref 3.5–5.1)
Sodium: 138 mmol/L (ref 135–145)

## 2020-12-27 LAB — RESP PANEL BY RT-PCR (FLU A&B, COVID) ARPGX2
Influenza A by PCR: NEGATIVE
Influenza B by PCR: NEGATIVE
SARS Coronavirus 2 by RT PCR: NEGATIVE

## 2020-12-27 MED ORDER — ONDANSETRON 4 MG PO TBDP: 4.0000 mg | ORAL_TABLET | Freq: Three times a day (TID) | ORAL | 0 refills | Status: DC | PRN

## 2020-12-27 NOTE — ED Provider Notes (Signed)
[place] DEPT Provider Note   CSN: [id number] Arrival date & time: 12/27/20  1551     History Chief Complaint  Patient presents with   Shortness of Breath    Holly [name] is a 40 y.o. female.  The history is provided by the patient and medical records.  Shortness of Breath Associated symptoms: cough    40 y.o. F with hx of ezcema, seasonal allergies, presenting to the ED for SOB.  Patient states her son has recently been sick with URI symptoms and she started feeling poorly about 1 week ago.  She reports cough, congestion, body aches, occasional SOB, intermittent nausea without vomiting/diarrhea.  States she has been able to eat/drink some but overall appetite is poor.  She has not been taking OTC meds since she has not been eating much.  She did recently have covid test that was negative.  She has been vaccinated for covid-19.  Past Medical History:  Diagnosis Date   Eczema    Hx: of   Pregnancy    Pregnancy induced hypertension    Seasonal allergies    Hx: of    Patient Active Problem List   Diagnosis Date Noted   Ankle swelling 11/08/2020   Furuncle of abdominal wall 07/17/2020   Gastroenteritis 06/18/2020   HLD (hyperlipidemia) 04/28/2020   LAD (lymphadenopathy), axillary 04/10/2020   Carpal tunnel syndrome, bilateral 02/18/2020   Toe pain, right 02/18/2020   IUD (intrauterine device) in place 11/21/2019   Seasonal allergies 10/27/2019   Eczema 10/27/2019   Diabetes mellitus without complication ([place]) 40/98/1191   Asthma 02/18/2016   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   HTN (hypertension) 09/30/2012   S/P cholecystectomy 08/29/2012   Obesity 04/15/2012   Rectal bleed 05/11/2011    Past Surgical History:  Procedure Laterality Date   c-sectionx1     CESAREAN SECTION     CHOLECYSTECTOMY N/A 08/30/2012   Procedure: LAPAROSCOPIC CHOLECYSTECTOMY WITH INTRAOPERATIVE CHOLANGIOGRAM;  Surgeon: Harl Bowie, MD;  Location: [place];   Service: General;  Laterality: N/A;   NO PAST SURGERIES     UPPER GI ENDOSCOPY     Hx: of     OB History     Gravida  3   Para  2   Term  2   Preterm      AB  1   Living  2      SAB  1   IAB      Ectopic      Multiple      Live Births  2           Family History  Problem Relation Age of Onset   Hypertension Father    Hyperlipidemia Father    Lung cancer Paternal Grandmother    Diabetes Maternal Grandfather    Kidney disease Paternal Uncle    Colon cancer Neg Hx    Esophageal cancer Neg Hx    Stomach cancer Neg Hx     Social History   Tobacco Use   Smoking status: Never   Smokeless tobacco: Never  Vaping Use   Vaping Use: Never used  Substance Use Topics   Alcohol use: No   Drug use: No    Home Medications Prior to Admission medications   Medication Sig Start Date End Date Taking? Authorizing Provider  ondansetron (ZOFRAN ODT) 4 MG disintegrating tablet Take 1 tablet (4 mg total) by mouth every 8 (eight) hours as needed for nausea. 12/27/20  Yes Quincy Carnes  M, PA-C  Accu-Chek Softclix Lancets lancets Use daily 08/05/20   Sharion Settler, DO  albuterol (PROVENTIL HFA;VENTOLIN HFA) 108 (90 Base) MCG/ACT inhaler Inhale 2 puffs into the lungs every 6 (six) hours as needed for wheezing or shortness of breath. 05/13/18   Tacy Learn, PA-C  amLODipine (NORVASC) 10 MG tablet Take 1 tablet (10 mg total) by mouth daily. 06/21/20   Sharion Settler, DO  Blood Glucose Monitoring Suppl (ACCU-CHEK GUIDE) w/Device KIT Check blood sugar daily. 08/05/20   Sharion Settler, DO  diclofenac Sodium (VOLTAREN) 1 % GEL Apply 4 g topically 4 (four) times daily. 04/08/20   Simmons-Robinson, Makiera, MD  fluticasone (FLONASE) 50 MCG/ACT nasal spray Place 2 sprays into both nostrils daily. 05/17/16   Nicolette Bang, MD  glucose blood (ACCU-CHEK GUIDE) test strip Use as instructed 08/05/20   Sharion Settler, DO  hydrochlorothiazide (HYDRODIURIL) 50 MG  tablet TAKE 1 TABLET BY MOUTH EVERY DAY 05/24/20   Sharion Settler, DO  insulin glargine (SEMGLEE) 100 UNIT/ML injection Inject 0.25 mLs (25 Units total) into the skin daily. 09/22/20   Simmons-Robinson, Makiera, MD  Insulin Pen Needle (BD PEN NEEDLE NANO U/F) 32G X 4 MM MISC Use as directed to administer insulin daily 08/05/20   Sharion Settler, DO  Lancets Misc. (ACCU-CHEK SOFTCLIX LANCET DEV) KIT 1 Device by Does not apply route daily. 08/05/20   Sharion Settler, DO  liraglutide (VICTOZA) 18 MG/3ML SOPN Inject 0.6 mg into the skin once a week. 0.6 mg once daily for 1 week,then increase to 1.2 mg once daily,max 1.8  mg 12/16/20   Simmons-Robinson, Riki Sheer, MD  metFORMIN (GLUCOPHAGE-XR) 500 MG 24 hr tablet Take 1 tablet (500 mg total) by mouth daily with breakfast. 08/03/20   Carollee Leitz, MD    Allergies    Naproxen sodium  Review of Systems   Review of Systems  Constitutional:  Positive for appetite change.  HENT:  Positive for congestion.   Respiratory:  Positive for cough and shortness of breath.   All other systems reviewed and are negative.  Physical Exam Updated Vital Signs BP (!) 152/108   Pulse 73   Temp 98.9 F (37.2 C) (Oral)   Resp 18   Ht _0  (1.651 m)   Wt (!) 140.6 kg   SpO2 100%   BMI 51.59 kg/m   Physical Exam Vitals and nursing note reviewed.  Constitutional:      Appearance: She is well-developed.  HENT:     Head: Normocephalic and atraumatic.  Eyes:     Conjunctiva/sclera: Conjunctivae normal.     Pupils: Pupils are equal, round, and reactive to light.  Cardiovascular:     Rate and Rhythm: Normal rate and regular rhythm.     Heart sounds: Normal heart sounds.  Pulmonary:     Effort: Pulmonary effort is normal.     Breath sounds: Normal breath sounds. No wheezing or rhonchi.     Comments: No observed cough during exam, lungs clear bilaterally, able to speak in full sentences without difficulty Abdominal:     General: Bowel sounds are normal.      Palpations: Abdomen is soft.     Tenderness: There is no abdominal tenderness. There is no rebound.     Comments: Soft, non-tender  Musculoskeletal:        General: Normal range of motion.     Cervical back: Normal range of motion.  Skin:    General: Skin is warm and dry.  Neurological:  Mental Status: She is alert and oriented to person, place, and time.    ED Results / Procedures / Treatments   Labs (all labs ordered are listed, but only abnormal results are displayed) Labs Reviewed  CBC WITH DIFFERENTIAL/PLATELET - Abnormal; Notable for the following components:      Result Value   WBC 12.1 (*)    Platelets 416 (*)    Neutro Abs 7.8 (*)    All other components within normal limits  BASIC METABOLIC PANEL - Abnormal; Notable for the following components:   Glucose, Bld 318 (*)    All other components within normal limits  RESP PANEL BY RT-PCR (FLU A&B, COVID) [id number]    EKG EKG Interpretation  Date/Time:  Monday December 27 2020 23:55:32 EDT Ventricular Rate:  76 PR Interval:  189 QRS Duration: 84 QT Interval:  402 QTC Calculation: 452 R Axis:   -22 Text Interpretation: Sinus rhythm Borderline left axis deviation Confirmed by Randal Buba, April ([id number]) on 12/27/2020 11:59:37 PM  Radiology DG Chest 2 View  Result Date: 12/27/2020 CLINICAL DATA:  Cough and shortness of breath for 1 week EXAM: CHEST - 2 VIEW COMPARISON:  05/13/2018 FINDINGS: The heart size and mediastinal contours are within normal limits. Both lungs are clear. The visualized skeletal structures are unremarkable. IMPRESSION: No active cardiopulmonary disease. Electronically Signed   By: Inez Catalina M.D.   On: 12/27/2020 16:56    Procedures Procedures   Medications Ordered in ED Medications - No data to display  ED Course  I have reviewed the triage vital signs and the nursing notes.  Pertinent labs & imaging results that were available during my care of the patient were reviewed by me and  considered in my medical decision making (see chart for details).    MDM Rules/Calculators/A&P                           40 year old female presenting to the ED with 1 week of URI type symptoms.  Her son is recently been sick with same.  Did have COVID test already that was negative.  She is afebrile and nontoxic in appearance here.  Her vitals are stable on room air.  She is currently not complaining of any shortness of breath, states this is sporadic.  She has not had any chest pain.  EKG is non-ischemic.  Labs reassuring.  CXR clear.  Covid/flu screen negative.    Suspect this is likely viral process given son at home with same. Encouraged symptomatic care at home, rest, hydration.  Close follow-up with PCP.  Return here for new concerns.  Final Clinical Impression(s) / ED Diagnoses Final diagnoses:  Cough  Nausea    Rx / DC Orders ED Discharge Orders          Ordered    ondansetron (ZOFRAN ODT) 4 MG disintegrating tablet  Every 8 hours PRN        12/27/20 2357             Larene Pickett, PA-C 12/28/20 0001    Luna Fuse, MD 01/01/21 2259

## 2020-12-27 NOTE — Discharge Instructions (Signed)
Take the prescribed medication as directed.  Make sure to rest and stay hydrated. Follow-up with your primary care doctor. Return to the ED for new or worsening symptoms.

## 2020-12-27 NOTE — ED Provider Notes (Signed)
Emergency Medicine Provider Triage Evaluation Note  Holly [name] , a 40 y.o. female  was evaluated in triage.  Pt complains of shortness of breath, mostly with exertion, cough, congestion.  Son recently sick with a URI.  Patient reports associated nausea and abdominal pain, no vomiting.  No change in urination or bowel movements.  History of diabetes.  Review of Systems  Positive: Cough, congestion, sob Negative: fever  Physical Exam  BP (!) 163/91 (BP Location: Right Arm)   Pulse 94   Temp 98.7 F (37.1 C) (Oral)   Resp 18   SpO2 99%  Gen:   Awake, no distress   Resp:  Normal effort  MSK:   Moves extremities without difficulty  Other:  No ttp of the abd  Medical Decision Making  Medically screening exam initiated at 4:45 PM.  Appropriate orders placed.  Holly [name] was informed that the remainder of the evaluation will be completed by another provider, this initial triage assessment does not replace that evaluation, and the importance of remaining in the ED until their evaluation is complete.  Labs, cxr, covid   Alveria Apley, PA-C 12/27/20 1646    Terrilee Files, MD 12/27/20 2131

## 2020-12-27 NOTE — ED Notes (Signed)
EKG stored, awaiting order for export. 

## 2020-12-27 NOTE — ED Triage Notes (Signed)
Reports SOB, cough, congestion and abd pain x1 week. She states SOB worsens w/ exertion. Reports son was recently sick w/ URI, negative for covid-19. Pt vaccinated against covid-19. Hx DM 2 and HTN

## 2021-01-06 ENCOUNTER — Encounter: Payer: Self-pay | Admitting: Family Medicine

## 2021-01-06 ENCOUNTER — Ambulatory Visit (INDEPENDENT_AMBULATORY_CARE_PROVIDER_SITE_OTHER): Payer: No Typology Code available for payment source | Admitting: Family Medicine

## 2021-01-06 ENCOUNTER — Other Ambulatory Visit: Payer: Self-pay

## 2021-01-06 VITALS — BP 130/75 | HR 88 | Wt 320.6 lb

## 2021-01-06 DIAGNOSIS — G629 Polyneuropathy, unspecified: Secondary | ICD-10-CM

## 2021-01-06 DIAGNOSIS — J302 Other seasonal allergic rhinitis: Secondary | ICD-10-CM | POA: Diagnosis not present

## 2021-01-06 DIAGNOSIS — E119 Type 2 diabetes mellitus without complications: Secondary | ICD-10-CM

## 2021-01-06 DIAGNOSIS — I10 Essential (primary) hypertension: Secondary | ICD-10-CM

## 2021-01-06 MED ORDER — CETIRIZINE HCL 10 MG PO TABS: 10.0000 mg | ORAL_TABLET | Freq: Every day | ORAL | 11 refills | Status: DC

## 2021-01-06 MED ORDER — EMPAGLIFLOZIN 10 MG PO TABS: 10.0000 mg | ORAL_TABLET | Freq: Every day | ORAL | 0 refills | Status: DC

## 2021-01-06 NOTE — Progress Notes (Signed)
    SUBJECTIVE:   CHIEF COMPLAINT / HPI: HTN and DM   DM Patient was unable to start Victoza due to excessive cost. She reports a spike in her blood sugar up to 320, prior to that it was 200. She has been doing 30 units of her insulin. She reports that she was recently ill with what is believed to be a viral illness with cough.  She states that she was negative for COVID when tested.  She states that since then she has had poor appetite and has not been eating normal amounts of food.  HTN  BP improved today. Patient reports adherence to antihypertensive regimen.   PERTINENT  PMH / PSH:  Diabetes Hypertension  OBJECTIVE:   BP 130/75   Pulse 88   Wt (!) 320 lb 9.6 oz (145.4 kg)   SpO2 96%   BMI 53.35 kg/m   General: female appearing stated age in no acute distress Cardio: Normal S1 and S2, no S3 or S4. Rhythm is regular. No murmurs or rubs.  Bilateral radial pulses palpable Pulm: Clear to auscultation bilaterally, no crackles, wheezing, or diminished breath sounds. Normal respiratory effort, stable on RA Abdomen: Bowel sounds normal. Abdomen soft and non-tender.  Extremities: 1+ peripheral edema. Warm/ well perfused.   ASSESSMENT/PLAN:   HTN (hypertension) BP within normal limits today  Congratulated patient on BP control and efforts with diet changes to lower BP   Diabetes mellitus without complication (HCC) Suspect higher CBG levels may be secondary to recent illness and stress She continues metformin and insulin  Was unable to afford GLP 1 agents  Will try adding jardiance, GFR>60 on last set of labs   Seasonal allergies Continues to use flonase  Will add cetirizine today      Ronnald Ramp, MD [facility]

## 2021-01-06 NOTE — Patient Instructions (Addendum)
For your allergy symptoms, please continue your Flonase.  I have also prescribed cetirizine for you to start taking daily for allergy symptoms.  Today we will collect blood work to measure your B12 levels.  Your last hemoglobin was well within normal range so we will not recheck that today.  I have also prescribed Jardiance for your diabetes, I recommend holding off on starting this until you begin to feel better from your recent illness.  Please follow-up with me in 1 month  Empagliflozin Tablets What is this medication? EMPAGLIFLOZIN (EM pa gli FLOE zin) treats type 2 diabetes. It works by helping your kidneys remove sugar (glucose) from your blood through the urine, which decreases your blood sugar. It can also be used to lower the risk of heart attack, stroke, and hospitalization for heart failure in people with type 2 diabetes. Changes to diet and exercise are often combined with this medication. This medicine may be used for other purposes; ask your health care provider or pharmacist if you have questions. COMMON BRAND NAME(S): Jardiance What should I tell my care team before I take this medication? They need to know if you have any of these conditions: Dehydration Diabetic ketoacidosis Diet low in salt Eating less due to illness, surgery, dieting, or any other reason Having surgery High cholesterol High levels of potassium in the blood History of pancreatitis or pancreas problems History of yeast infection of the penis or vagina If you often drink alcohol Infections in the bladder, kidneys, or urinary tract Kidney disease Liver disease Low blood pressure On hemodialysis Problems urinating Type 1 diabetes Uncircumcised female An unusual or allergic reaction to empagliflozin, other medications, foods, dyes, or preservatives Pregnant or trying to get pregnant Breast-feeding How should I use this medication? Take this medication by mouth with water. Take it as directed on the  prescription label at the same time every day. You may take it with or without food. Keep taking it unless your care team tells you to stop. A special MedGuide will be given to you by the pharmacist with each prescription and refill. Be sure to read this information carefully each time. Talk to your care team about the use of this medication in children. Special care may be needed. Overdosage: If you think you have taken too much of this medicine contact a poison control center or emergency room at once. NOTE: This medicine is only for you. Do not share this medicine with others. What if I miss a dose? If you miss a dose, take it as soon as you can. If it is almost time for your next dose, take only that dose. Do not take double or extra doses. What may interact with this medication? Alcohol Diuretics Insulin This list may not describe all possible interactions. Give your health care provider a list of all the medicines, herbs, non-prescription drugs, or dietary supplements you use. Also tell them if you smoke, drink alcohol, or use illegal drugs. Some items may interact with your medicine. What should I watch for while using this medication? Visit your care team for regular checks on your progress. Tell your care team if your symptoms do not start to get better or if they get worse. This medication can cause a serious condition in which there is too much acid in the blood. If you develop nausea, vomiting, stomach pain, unusual tiredness, or breathing problems, stop taking this medication and call your care team right away. If possible, use a ketone dipstick to check  for ketones in your urine. Check with your care team if you have severe diarrhea, nausea, and vomiting, or if you sweat a lot. The loss of too much body fluid may make it dangerous for you to take this medication. A test called the HbA1C (A1C) will be monitored. This is a simple blood test. It measures your blood sugar control over the  last 2 to 3 months. You will receive this test every 3 to 6 months. Learn how to check your blood sugar. Learn the symptoms of low and high blood sugar and how to manage them. Always carry a quick-source of sugar with you in case you have symptoms of low blood sugar. Examples include hard sugar candy or glucose tablets. Make sure others know that you can choke if you eat or drink when you develop serious symptoms of low blood sugar, such as seizures or unconsciousness. Get medical help at once. Tell your care team if you have high blood sugar. You might need to change the dose of your medication. If you are sick or exercising more than usual, you may need to change the dose of your medication. What side effects may I notice from receiving this medication? Side effects that you should report to your care team as soon as possible: Allergic reactions-skin rash, itching, hives, swelling of the face, lips, tongue, or throat Dehydration-increased thirst, dry mouth, feeling faint or lightheaded, headache, dark yellow or brown urine Diabetic ketoacidosis (DKA)-increased thirst or amount of urine, dry mouth, fatigue, fruity odor to breath, trouble breathing, stomach pain, nausea, vomiting Genital yeast infection-redness, swelling, pain, or itchiness, odor, thick or lumpy discharge New pain or tenderness, change in skin color, sores or ulcers, infection of the leg or foot Infection or redness, swelling, tenderness, or pain in the genitals, or area from the genitals to the back of the rectum Urinary tract infection (UTI)-burning when passing urine, passing frequent small amounts of urine, bloody or cloudy urine, pain in the lower back or sides This list may not describe all possible side effects. Call your doctor for medical advice about side effects. You may report side effects to FDA at 1-800-FDA-1088. Where should I keep my medication? Keep out of the reach of children and pets. Store at room temperature  between 20 and 25 degrees C (68 and 77 degrees F). Get rid of any unused medication after the expiration date. To get rid of medications that are no longer needed or have expired: Take the medication to a medication take-back program. Check with your pharmacy or law enforcement to find a location. If you cannot return the medication, check the label or package insert to see if the medication should be thrown out in the garbage or flushed down the toilet. If you are not sure, ask your care team. If it is safe to put it in the trash, take the medication out of the container. Mix the medication with cat litter, dirt, coffee grounds, or other unwanted substance. Seal the mixture in a bag or container. Put it in the trash. NOTE: This sheet is a summary. It may not cover all possible information. If you have questions about this medicine, talk to your doctor, pharmacist, or health care provider.  2022 Elsevier/Gold Standard (2020-05-31 14:18:18)

## 2021-01-07 LAB — VITAMIN B12: Vitamin B-12: 584 pg/mL (ref 232–1245)

## 2021-01-08 NOTE — Assessment & Plan Note (Signed)
Suspect higher CBG levels may be secondary to recent illness and stress She continues metformin and insulin  Was unable to afford GLP 1 agents  Will try adding jardiance, GFR>60 on last set of labs

## 2021-01-08 NOTE — Assessment & Plan Note (Signed)
>>  ASSESSMENT AND PLAN FOR DIABETES MELLITUS WITHOUT COMPLICATION (HCC) WRITTEN ON 01/08/2021 11:08 PM BY SIMMONS-ROBINSON, MAKIERA, MD  Suspect higher CBG levels may be secondary to recent illness and stress She continues metformin  and insulin   Was unable to afford GLP 1 agents  Will try adding jardiance , GFR>60 on last set of labs

## 2021-01-08 NOTE — Assessment & Plan Note (Signed)
Continues to use flonase  Will add cetirizine today

## 2021-01-08 NOTE — Assessment & Plan Note (Signed)
BP within normal limits today  Congratulated patient on BP control and efforts with diet changes to lower BP

## 2021-01-24 ENCOUNTER — Other Ambulatory Visit: Payer: Self-pay | Admitting: Family Medicine

## 2021-01-24 DIAGNOSIS — I10 Essential (primary) hypertension: Secondary | ICD-10-CM

## 2021-02-02 ENCOUNTER — Ambulatory Visit: Payer: No Typology Code available for payment source | Admitting: Family Medicine

## 2021-02-02 NOTE — Progress Notes (Deleted)
    SUBJECTIVE:   CHIEF COMPLAINT / HPI: DM f/u   Diabetes Current Regimen: metformin, insulin 30 units, ***jardiance?  CBGs: ***  Last A1c:  Lab Results  Component Value Date   HGBA1C 9.6 (A) 11/08/2020    Denies polyuria, polydipsia, hypoglycemia *** Last Eye Exam: needs ophthalmologist, difficulty finding practice that will accept her insurance  Statin: *** ACE/ARB: ***   PERTINENT  PMH / PSH:  HTN  DM   OBJECTIVE:   There were no vitals taken for this visit.  Physical Exam   ASSESSMENT/PLAN:   No problem-specific Assessment & Plan notes found for this encounter.     Ronnald Ramp, MD [facility]

## 2021-02-07 ENCOUNTER — Encounter (HOSPITAL_COMMUNITY): Payer: Self-pay | Admitting: Emergency Medicine

## 2021-02-07 ENCOUNTER — Ambulatory Visit (HOSPITAL_COMMUNITY)
Admission: EM | Admit: 2021-02-07 | Discharge: 2021-02-07 | Disposition: A | Discharge status: Home / Self Care | Payer: No Typology Code available for payment source | Attending: Sports Medicine | Admitting: Sports Medicine

## 2021-02-07 ENCOUNTER — Other Ambulatory Visit: Payer: Self-pay

## 2021-02-07 DIAGNOSIS — L0231 Cutaneous abscess of buttock: Secondary | ICD-10-CM

## 2021-02-07 MED ORDER — LIDOCAINE HCL (PF) 1 % IJ SOLN
INTRAMUSCULAR | Status: AC
  Filled 2021-02-07: qty 30

## 2021-02-07 MED ORDER — SULFAMETHOXAZOLE-TRIMETHOPRIM 800-160 MG PO TABS: 1.0000 | ORAL_TABLET | Freq: Two times a day (BID) | ORAL | 0 refills | Status: AC

## 2021-02-07 NOTE — ED Provider Notes (Signed)
[facility]    CSN: [id number] Arrival date & time: 02/07/21  1252      History   Chief Complaint Chief Complaint  Patient presents with   Abscess    HPI Holly [name] is a 40 y.o. female who presents for painful bump between buttocks.  She reports she noticed a painful bump in between her buttocks that began this Friday or Saturday.  She was able to see the area in the mirror, and it was raised and somewhat red.  She was attempting to clean it with soap and water and put a bandage over the area.  Over the last few days, it has increased in size and gotten more painful.  She denies any spontaneous drainage to her knowledge.  She denies any history of abscess or hidradenitis.  She is a diabetic on once daily Lantus and metformin.  She has not been applying any topical treatment or taking any medication for this.  The patient denies any fever or chills.  She does state as her pain has increased, she has been feeling somewhat nauseous.  Denies any diarrhea or vomiting.  No headache or dizziness.  Patient denies any chance of being pregnant.   Past Medical History:  Diagnosis Date   Eczema    Hx: of   Pregnancy    Pregnancy induced hypertension    Seasonal allergies    Hx: of    Patient Active Problem List   Diagnosis Date Noted   Ankle swelling 11/08/2020   Furuncle of abdominal wall 07/17/2020   Gastroenteritis 06/18/2020   HLD (hyperlipidemia) 04/28/2020   LAD (lymphadenopathy), axillary 04/10/2020   Carpal tunnel syndrome, bilateral 02/18/2020   Toe pain, right 02/18/2020   IUD (intrauterine device) in place 11/21/2019   Seasonal allergies 10/27/2019   Eczema 10/27/2019   Diabetes mellitus without complication (HCC) 08/21/2019   Asthma 02/18/2016   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   HTN (hypertension) 09/30/2012   S/P cholecystectomy 08/29/2012   Obesity 04/15/2012   Rectal bleed 05/11/2011    Past Surgical History:  Procedure Laterality Date    c-sectionx1     CESAREAN SECTION     CHOLECYSTECTOMY N/A 08/30/2012   Procedure: LAPAROSCOPIC CHOLECYSTECTOMY WITH INTRAOPERATIVE CHOLANGIOGRAM;  Surgeon: Douglas A Blackman, MD;  Location: [facility] OR;  Service: General;  Laterality: N/A;   NO PAST SURGERIES     UPPER GI ENDOSCOPY     Hx: of    OB History     Gravida  3   Para  2   Term  2   Preterm      AB  1   Living  2      SAB  1   IAB      Ectopic      Multiple      Live Births  2            Home Medications    Prior to Admission medications   Medication Sig Start Date End Date Taking? Authorizing Provider  sulfamethoxazole-trimethoprim (BACTRIM DS) 800-160 MG tablet Take 1 tablet by mouth 2 (two) times daily for 7 days. 02/07/21 02/14/21 Yes Brooks, Dana, DO  Accu-Chek Softclix Lancets lancets Use daily 08/05/20   Espinoza, Alejandra, DO  albuterol (PROVENTIL HFA;VENTOLIN HFA) 108 (90 Base) MCG/ACT inhaler Inhale 2 puffs into the lungs every 6 (six) hours as needed for wheezing or shortness of breath. 05/13/18   Murphy, Laura A, PA-C  amLODipine (NORVASC) 10 MG tablet Take 1 tablet (  10 mg total) by mouth daily. 06/21/20   Espinoza, Alejandra, DO  Blood Glucose Monitoring Suppl (ACCU-CHEK GUIDE) w/Device KIT Check blood sugar daily. 08/05/20   Espinoza, Alejandra, DO  cetirizine (ZYRTEC) 10 MG tablet Take 1 tablet (10 mg total) by mouth daily. 01/06/21   Simmons-Robinson, Makiera, MD  diclofenac Sodium (VOLTAREN) 1 % GEL Apply 4 g topically 4 (four) times daily. 04/08/20   Simmons-Robinson, Makiera, MD  empagliflozin (JARDIANCE) 10 MG TABS tablet Take 1 tablet (10 mg total) by mouth daily. 01/06/21   Simmons-Robinson, Makiera, MD  fluticasone (FLONASE) 50 MCG/ACT nasal spray Place 2 sprays into both nostrils daily. 05/17/16   Wallace, Catherine Lauren, MD  glucose blood (ACCU-CHEK GUIDE) test strip Use as instructed 08/05/20   Espinoza, Alejandra, DO  hydrochlorothiazide (HYDRODIURIL) 50 MG tablet TAKE 1 TABLET BY MOUTH  EVERY DAY 01/24/21   Espinoza, Alejandra, DO  insulin glargine (SEMGLEE) 100 UNIT/ML injection Inject 0.25 mLs (25 Units total) into the skin daily. 09/22/20   Simmons-Robinson, Makiera, MD  Insulin Pen Needle (BD PEN NEEDLE NANO U/F) 32G X 4 MM MISC Use as directed to administer insulin daily 08/05/20   Espinoza, Alejandra, DO  Lancets Misc. (ACCU-CHEK SOFTCLIX LANCET DEV) KIT 1 Device by Does not apply route daily. 08/05/20   Espinoza, Alejandra, DO  metFORMIN (GLUCOPHAGE-XR) 500 MG 24 hr tablet Take 1 tablet (500 mg total) by mouth daily with breakfast. 08/03/20   Walsh, Tanya, MD  ondansetron (ZOFRAN ODT) 4 MG disintegrating tablet Take 1 tablet (4 mg total) by mouth every 8 (eight) hours as needed for nausea. 12/27/20   Sanders, Lisa M, PA-C    Family History Family History  Problem Relation Age of Onset   Hypertension Father    Hyperlipidemia Father    Lung cancer Paternal Grandmother    Diabetes Maternal Grandfather    Kidney disease Paternal Uncle    Colon cancer Neg Hx    Esophageal cancer Neg Hx    Stomach cancer Neg Hx     Social History Social History   Tobacco Use   Smoking status: Never   Smokeless tobacco: Never  Vaping Use   Vaping Use: Never used  Substance Use Topics   Alcohol use: No   Drug use: No     Allergies   Naproxen sodium   Review of Systems Review of Systems  Constitutional:  Positive for activity change. Negative for chills and fever.  Gastrointestinal:  Positive for nausea. Negative for blood in stool and diarrhea.  Skin:  Negative for rash.       + abscess right inner buttock  Neurological:  Negative for weakness.    Physical Exam Triage Vital Signs ED Triage Vitals  Enc Vitals Group     BP 02/07/21 1503 (!) 130/99     Pulse Rate 02/07/21 1503 100     Resp 02/07/21 1503 19     Temp 02/07/21 1503 99.8 F (37.7 C)     Temp Source 02/07/21 1503 Oral     SpO2 02/07/21 1503 96 %     Weight --      Height --      Head Circumference --       Peak Flow --      Pain Score 02/07/21 1501 10     Pain Loc --      Pain Edu? --      Excl. in GC? --    No data found.  Updated Vital Signs BP (!) 130/99 (  BP Location: Left Arm)   Pulse 100   Temp 99.8 F (37.7 C) (Oral)   Resp 19   SpO2 96%   Visual Acuity Right Eye Distance:   Left Eye Distance:   Bilateral Distance:    Right Eye Near:   Left Eye Near:    Bilateral Near:     Physical Exam Constitutional:      Appearance: Normal appearance. She is not ill-appearing.  HENT:     Head: Normocephalic and atraumatic.  Eyes:     Extraocular Movements: Extraocular movements intact.     Pupils: Pupils are equal, round, and reactive to light.  Cardiovascular:     Rate and Rhythm: Normal rate.  Skin:    General: Skin is warm.     Capillary Refill: Capillary refill takes less than 2 seconds.     Comments: + there is a slightly erythematous, indurated raised lesion of the soft tissue of the inner portion of the lower right buttock which is approximately 3 x 2.5 cm in size. No spreading erythema. The area does come to a very small white-head in the center of the abscess, although no spontaneous drainage noted.  Neurological:     Mental Status: She is alert.  Psychiatric:        Mood and Affect: Mood normal.        Thought Content: Thought content normal.     UC Treatments / Results  Labs (all labs ordered are listed, but only abnormal results are displayed) Labs Reviewed - No data to display  EKG   Radiology No results found.  Procedures Incision and Drainage  Date/Time: 02/07/2021 4:00 PM Performed by: Elba Barman, DO Authorized by: Elba Barman, DO   Consent:    Consent obtained:  Verbal   Consent given by:  Patient   Risks, benefits, and alternatives were discussed: yes     Risks discussed:  Bleeding, incomplete drainage, pain and infection   Alternatives discussed:  Observation and alternative treatment Universal protocol:    Procedure explained  and questions answered to patient or proxy's satisfaction: yes     Site/side marked: yes     Immediately prior to procedure, a time out was called: yes     Patient identity confirmed:  Verbally with patient Location:    Type:  Abscess   Size:  3 x 2.5 cm   Location:  Lower extremity   Lower extremity location:  Buttock   Buttock location:  R buttock Pre-procedure details:    Skin preparation:  Chlorhexidine and chlorhexidine with alcohol Sedation:    Sedation type:  None Anesthesia:    Anesthesia method:  Local infiltration   Local anesthetic:  Lidocaine 1% w/o epi Procedure type:    Complexity:  Complex Procedure details:    Incision types:  Single straight   Incision depth:  Dermal   Wound management:  Probed and deloculated and extensive cleaning   Drainage:  Purulent and bloody   Drainage amount:  Moderate   Packing materials:  1/4 in iodoform gauze Post-procedure details:    Procedure completion:  Tolerated Comments:     Approximately 6-inches of iodoform gauze packing placed (including critical care time)  Medications Ordered in UC Medications - No data to display  Initial Impression / Assessment and Plan / UC Course  I have reviewed the triage vital signs and the nursing notes.  Pertinent labs & imaging results that were available during my care of the patient were reviewed by me and  considered in my medical decision making (see chart for details).     Abscess, inner right buttock  Localized, but painful to patient. Located within soft tissue. No pilonidal cyst identified. Abscess was approx 3 x 2.5 cm. Throughout shared decision making, elected for I&D today (see above note).  Given patient's diabetes, will also treat with Bactrim DS BID x 7 days.  Discussed importance of keeping this area clean and covered with bandage.  May undergo warm sitz bath to help with removal of the remainder of infection. Patient had already contacted PCP, so she will follow-up with them  later this week.  Discussed if she cannot get in with her PCP, to follow-up here in the urgent care for reevaluation over the next few days and removal of the iodoform packing.  Strict return precautions given.  Patient is agreeable and understanding.  Safe for discharge home Final Clinical Impressions(s) / UC Diagnoses   Final diagnoses:  Abscess of buttock, right     Discharge Instructions      We drained your abscess today  - Keep this clean and covered with bandage for the next week - Warm sitz baths to help remove the remainder of infection - Bactrim DS --> take 1 tablet twice a day for a week - May take ibuprofen or tylenol for pain control - Make sure to follow-up with your PCP or here at the urgent care later in the week. We did pack the wound today, so this will need removed here later this week.      ED Prescriptions     Medication Sig Dispense Auth. Provider   sulfamethoxazole-trimethoprim (BACTRIM DS) 800-160 MG tablet Take 1 tablet by mouth 2 (two) times daily for 7 days. 14 tablet Elba Barman, DO      PDMP not reviewed this encounter.   Elba Barman, DO 02/07/21 1607

## 2021-02-07 NOTE — Discharge Instructions (Addendum)
We drained your abscess today  - Keep this clean and covered with bandage for the next week - Warm sitz baths to help remove the remainder of infection - Bactrim DS --> take 1 tablet twice a day for a week - May take ibuprofen or tylenol for pain control - Make sure to follow-up with your PCP or here at the urgent care later in the week. We did pack the wound today, so this will need removed here later this week.

## 2021-02-07 NOTE — ED Triage Notes (Signed)
Pt c/o bump that has gotten larger and more painful over the weekend in between buttocks. Denies drainage

## 2021-02-08 ENCOUNTER — Ambulatory Visit: Payer: No Typology Code available for payment source

## 2021-02-08 NOTE — Progress Notes (Deleted)
    SUBJECTIVE:   CHIEF COMPLAINT / HPI:   Abscess of Right Buttock: Patient was seen in urgent care yesterday with an I&D performed and the wound packed. She was started on bactrim given her history of diabetes. Today she states ***  PERTINENT  PMH / PSH: ***  OBJECTIVE:   There were no vitals taken for this visit. ***  General: NAD, pleasant, able to participate in exam Cardiac: RRR, no murmurs. Respiratory: CTAB, normal effort, No wheezes, rales or rhonchi Extremities: no edema or cyanosis. Skin: *** Neuro: alert, no obvious focal deficits Psych: Normal affect and mood  ASSESSMENT/PLAN:   No problem-specific Assessment & Plan notes found for this encounter.   *** Continue bactrim. Wound packing was removed***  Jackelyn Poling, DO  [facility]    {    This will disappear when note is signed, click to select method of visit    :1}

## 2021-02-15 ENCOUNTER — Ambulatory Visit (INDEPENDENT_AMBULATORY_CARE_PROVIDER_SITE_OTHER): Payer: No Typology Code available for payment source | Admitting: Family Medicine

## 2021-02-15 ENCOUNTER — Encounter: Payer: Self-pay | Admitting: Family Medicine

## 2021-02-15 ENCOUNTER — Other Ambulatory Visit: Payer: Self-pay

## 2021-02-15 VITALS — BP 138/89 | HR 92 | Ht 65.0 in

## 2021-02-15 DIAGNOSIS — L0231 Cutaneous abscess of buttock: Secondary | ICD-10-CM | POA: Diagnosis not present

## 2021-02-15 HISTORY — DX: Cutaneous abscess of buttock: L02.31

## 2021-02-15 NOTE — Progress Notes (Signed)
    SUBJECTIVE:   CHIEF COMPLAINT / HPI:   Patient with history of DM presents for bump on her bottom that she first noticed on 10/21 and continued to grow large. She was recently seen by urgent care on 10/24. Denies any drainage or bleeding initially that she noticed although states it is hard to visualize given the location of the abscess. History of a boil in the axillary region years ago but never around her bottom. Was noted to be an abscess, had an incision and drainage performed this day then was sent home with 7 day course of bactrim bid which she has remained compliant, she is scheduled to complete course 11/2. Packing was placed and instructed to either see PCP or return back to urgent care to get packing removed. Endorses that it is difficult to put pressure on it or sit down. Denies fever, chills, vomiting and denies any sick contacts. Since urgent care visit, pain has improved significantly. Minimal drainage that she has noticed on the dressing when changing as instructed. Cleans with peroxide and adds neosporin which has helped. Reports that some of the packing fell out yesterday.    OBJECTIVE:   BP 138/89, Pulse ox 98%  General: Patient well-appearing, sitting comfortably in no acute distress. HEENT: non-tender thyroid, no evidence of cervical LAD CV: RRR, no murmurs or gallops auscultated Resp: CTAB Derm: about 2 cm incision noted with minimal bleeding, no purulent drainage, healing well without signs of infection, no other rash or lesions noted, no surrounding erythema or edema noted    Neuro: normal gait   Exam performed in the presence of chaperone.   ASSESSMENT/PLAN:   Abscess of buttock -remainder of packing removed -replaced clean and dry dressing -continue warm sitz baths -complete bactrim course, scheduled to end 11/2 -encouraged to use hibiclens every few days or once weekly -strict return precautions discussed    Reece Leader, DO [facility]

## 2021-02-15 NOTE — Assessment & Plan Note (Signed)
-  remainder of packing removed -replaced clean and dry dressing -continue warm sitz baths -complete bactrim course, scheduled to end 11/2 -encouraged to use hibiclens every few days or once weekly -strict return precautions discussed

## 2021-02-15 NOTE — Patient Instructions (Addendum)
It was great seeing you today!  I am glad you have had proper care for your abscess thus far, I am glad it is feeling a lot better now.  Please make sure to continue and complete the bactrim course to prevent an infection from spreading.   Continue with warm sitz baths to further alleviate pain and pressure. I have removed the packing, please continue to keep the area dry and clean with covering. If it is worsening or you notice any swelling, larger growth, significant pain or even start to develop a fever, please contact our office.   Using hibiclens solution can not only keep the area clean but prevent new infected areas from forming.   Please follow up at your next scheduled appointment as needed, if anything arises between now and then, please don't hesitate to contact our office.   Thank you for allowing [place] to be a part of your medical care!  Thank you, Dr. Robyne Peers

## 2021-02-28 ENCOUNTER — Other Ambulatory Visit: Payer: Self-pay | Admitting: Family Medicine

## 2021-02-28 DIAGNOSIS — E119 Type 2 diabetes mellitus without complications: Secondary | ICD-10-CM

## 2021-03-02 ENCOUNTER — Ambulatory Visit: Payer: No Typology Code available for payment source | Admitting: Family Medicine

## 2021-03-17 NOTE — Progress Notes (Signed)
    SUBJECTIVE:   CHIEF COMPLAINT / HPI:   Abscess of buttock 11/1  Says it is resolved now.  DM Patient endorses issues on last visit with victoza cost  Home medications include: insulin 36 units daily, metformin 500 daily.  Has not been taking jardiance 10 mg daily (too high cost)   Most recent A1Cs:  Lab Results  Component Value Date   HGBA1C 15.0 (A) 03/18/2021   HGBA1C 9.6 (A) 11/08/2020   HGBA1C 11.9 (A) 08/03/2020   Last Microalbumin, LDL, Creatinine: Lab Results  Component Value Date   MICROALBUR 80 08/20/2019   LDLCALC 121 (H) 04/01/2020   CREATININE 0.64 12/27/2020   Patient does check blood glucose on a regular basis. Low 200s to high 100s.  Patient is not up to date on diabetic eye. Patient is not up to date on diabetic foot exam.   HTN BPs at home are 120/80 to 130/80. Took her amlodipine and HCTZ later today than normal. No headaches, vision changes, sob.  PERTINENT  PMH / PSH: DM2, HTN,   OBJECTIVE:   BP (!) 138/105   Pulse 92   Ht 5\' 5"  (1.651 m)   Wt (!) 310 lb 4 oz (140.7 kg)   SpO2 99%   BMI 51.63 kg/m   General: Well appearing, NAD, awake, alert, responsive to questions Head: Normocephalic atraumatic CV: Regular rate and rhythm no murmurs rubs or gallops Respiratory: Clear to ausculation bilaterally, no wheezes rales or crackles, chest rises symmetrically,  no increased work of breathing Abdomen: Soft, non-tender, non-distended, normoactive bowel sounds  Extremities: Moves upper and lower extremities freely, no pitting edema in LE   ASSESSMENT/PLAN:   HTN (hypertension) BP mildly elevated today at 138/105. Patient takes BP at home and is more appropriate. -Continue amlodipine 10 mg -Continue HCTZ 50 mg   Diabetes mellitus without complication (HCC) A1c >15 today. Has been taking insulin 36 units daily and metformin 500 mg daily. Has been unable to take jardiance because of medication cost.  -Advised taking blood sugar every  morning and increasing insulin by 2 units every day until blood sugars are around 150 and stopping there -continue metformin 500 mg -Pharmacy appointment scheduled for medication management -Nutrition referral made -Urine microalbumin/cr -atorvastatin 20 mg started     , MD [facility]

## 2021-03-18 ENCOUNTER — Other Ambulatory Visit: Payer: Self-pay

## 2021-03-18 ENCOUNTER — Ambulatory Visit (INDEPENDENT_AMBULATORY_CARE_PROVIDER_SITE_OTHER): Payer: No Typology Code available for payment source | Admitting: Student

## 2021-03-18 ENCOUNTER — Encounter: Payer: Self-pay | Admitting: Student

## 2021-03-18 VITALS — BP 138/105 | HR 92 | Ht 65.0 in | Wt 310.2 lb

## 2021-03-18 DIAGNOSIS — E119 Type 2 diabetes mellitus without complications: Secondary | ICD-10-CM

## 2021-03-18 DIAGNOSIS — I10 Essential (primary) hypertension: Secondary | ICD-10-CM

## 2021-03-18 LAB — POCT GLYCOSYLATED HEMOGLOBIN (HGB A1C): HbA1c POC (<> result, manual entry): >15 % (ref 4.0–5.6)

## 2021-03-18 MED ORDER — ATORVASTATIN CALCIUM 20 MG PO TABS: 20.0000 mg | ORAL_TABLET | Freq: Every day | ORAL | 3 refills | Status: DC

## 2021-03-18 NOTE — Patient Instructions (Addendum)
It was great to see you! Thank you for allowing me to participate in your care!   I recommend that you always bring your medications to each appointment as this makes it easy to ensure we are on the correct medications and helps [place] not miss when refills are needed.  Our plans for today:  - Your A1c was checked today and was greater than 15. Please take your blood sugar every morning and increase your insulin by 2 units every day until your blood sugars are around 150. Once they are 150, please take that amount of insulin daily. If blood sugar less than 100 units please call the office  - Follow up with Dr. Raymondo Band in a couple weeks or Diabetes management - started statin medication today -checking your urine for protein -put nutrition referral in, call Dr. Gerilyn Pilgrim  We are checking some labs today, I will call you if they are abnormal will send you a MyChart message or a letter if they are normal.  If you do not hear about your labs in the next 2 weeks please let us know.  Take care and seek immediate care sooner if you develop any concerns. Please remember to show up 15 minutes before your scheduled appointment time!  Levin Erp, MD [facility]

## 2021-03-19 LAB — MICROALBUMIN / CREATININE URINE RATIO
Creatinine, Urine: 24.7 mg/dL
Microalb/Creat Ratio: 17 mg/g creat (ref 0–29)
Microalbumin, Urine: 4.3 ug/mL

## 2021-03-20 ENCOUNTER — Encounter: Payer: Self-pay | Admitting: Student

## 2021-03-20 NOTE — Assessment & Plan Note (Signed)
>>  ASSESSMENT AND PLAN FOR DIABETES MELLITUS WITHOUT COMPLICATION (HCC) WRITTEN ON 03/20/2021 10:54 AM BY JAGADISH, MAYURI, MD  A1c >15 today. Has been taking insulin  36 units daily and metformin  500 mg daily. Has been unable to take jardiance  because of medication cost.  -Advised taking blood sugar every morning and increasing insulin  by 2 units every day until blood sugars are around 150 and stopping there -continue metformin  500 mg -Pharmacy appointment scheduled for medication management -Nutrition referral made -Urine microalbumin/cr -atorvastatin  20 mg started

## 2021-03-20 NOTE — Assessment & Plan Note (Signed)
BP mildly elevated today at 138/105. Patient takes BP at home and is more appropriate. -Continue amlodipine 10 mg -Continue HCTZ 50 mg

## 2021-03-20 NOTE — Assessment & Plan Note (Signed)
A1c >15 today. Has been taking insulin 36 units daily and metformin 500 mg daily. Has been unable to take jardiance because of medication cost.  -Advised taking blood sugar every morning and increasing insulin by 2 units every day until blood sugars are around 150 and stopping there -continue metformin 500 mg -Pharmacy appointment scheduled for medication management -Nutrition referral made -Urine microalbumin/cr -atorvastatin 20 mg started

## 2021-03-28 ENCOUNTER — Other Ambulatory Visit: Payer: Self-pay

## 2021-03-28 ENCOUNTER — Ambulatory Visit (INDEPENDENT_AMBULATORY_CARE_PROVIDER_SITE_OTHER): Payer: No Typology Code available for payment source

## 2021-03-28 ENCOUNTER — Ambulatory Visit (INDEPENDENT_AMBULATORY_CARE_PROVIDER_SITE_OTHER): Payer: No Typology Code available for payment source | Admitting: Family Medicine

## 2021-03-28 ENCOUNTER — Encounter: Payer: Self-pay | Admitting: Family Medicine

## 2021-03-28 VITALS — BP 135/81 | HR 87 | Ht 65.0 in | Wt 312.8 lb

## 2021-03-28 DIAGNOSIS — H60331 Swimmer's ear, right ear: Secondary | ICD-10-CM | POA: Diagnosis not present

## 2021-03-28 DIAGNOSIS — Z23 Encounter for immunization: Secondary | ICD-10-CM

## 2021-03-28 DIAGNOSIS — H6091 Unspecified otitis externa, right ear: Secondary | ICD-10-CM | POA: Insufficient documentation

## 2021-03-28 MED ORDER — NEOMYCIN-POLYMYXIN-HC 3.5-10000-1 OT SOLN: 3.0000 [drp] | Freq: Four times a day (QID) | OTIC | 0 refills | Status: DC

## 2021-03-28 NOTE — Assessment & Plan Note (Signed)
Topical antibiotics.  Given pain with otitis externa, will not attempt to irrigate clear today.  She will return once pain has resolved for irrigation of cerumen impaction

## 2021-03-28 NOTE — Progress Notes (Signed)
    SUBJECTIVE:   CHIEF COMPLAINT / HPI:   C/O right ear pain x 3 days.  No fever, rhinnorea, cough or sore throat.  Seems to be able to "pop" ears normally.  Rarely gets water in her ear.  Does remember scratching the inside of her ear prior to this starting.    OBJECTIVE:   BP 135/81   Pulse 87   Ht 5\' 5"  (1.651 m)   Wt (!) 312 lb 12.8 oz (141.9 kg)   SpO2 98%   BMI 52.05 kg/m   Left canal and TM normal Right ear pain to manipulation of pinna and tragus.  Canal red and edema.  TM obscured by deep cerumen.    ASSESSMENT/PLAN:   Right otitis externa Topical antibiotics.  Given pain with otitis externa, will not attempt to irrigate clear today.  She will return once pain has resolved for irrigation of cerumen impaction     , MD [facility]

## 2021-03-28 NOTE — Patient Instructions (Signed)
You have an outer ear infection AKA swimmers ear or otitis externa. I sent in some ear drops which should clear you up.  You have some wax deep in that ear.  It would be a good idea to return when the pain is gone and let us wash that wax out.

## 2021-04-01 ENCOUNTER — Ambulatory Visit: Payer: No Typology Code available for payment source | Admitting: Pharmacist

## 2021-04-21 ENCOUNTER — Other Ambulatory Visit: Payer: Self-pay | Admitting: Family Medicine

## 2021-04-21 ENCOUNTER — Other Ambulatory Visit: Payer: Self-pay

## 2021-04-21 ENCOUNTER — Ambulatory Visit (INDEPENDENT_AMBULATORY_CARE_PROVIDER_SITE_OTHER): Payer: No Typology Code available for payment source | Admitting: Family Medicine

## 2021-04-21 DIAGNOSIS — K029 Dental caries, unspecified: Secondary | ICD-10-CM

## 2021-04-21 DIAGNOSIS — I10 Essential (primary) hypertension: Secondary | ICD-10-CM

## 2021-04-21 MED ORDER — AMOXICILLIN-POT CLAVULANATE 875-125 MG PO TABS: 1.0000 | ORAL_TABLET | Freq: Two times a day (BID) | ORAL | 0 refills | Status: AC

## 2021-04-21 NOTE — Progress Notes (Signed)
° ° °  SUBJECTIVE:   CHIEF COMPLAINT / HPI: sinus pain  Reports sinus pain and pressure for 4 days.  Endorses right side headache and some nasal congestion.  Denies any fevers, visual disturbances, slurred speech, numbness or weakness, or gait disturbances.  No ear pain or discharge.  Endorses right side tooth pain and had not been able to see dentist.  No difficulty swallowing or breathing.  PERTINENT  PMH / PSH:  DM Type 2, uncontrolled HTN Asthma Recent ROE  OBJECTIVE:   BP (!) 136/99    Pulse (!) 108    Ht 5\' 5"  (1.651 m)    Wt (!) 301 lb 9.6 oz (136.8 kg)    SpO2 97%    BMI 50.19 kg/m    General: Alert, no acute distress HEENT: PERRL, EOMs intact, TM's visible bilaterally with notable fluid posteriorly, mild erythema noted around canal, no drainage or tenderness over tragus Rt upper and lower molar dental caries with notal gingival erythema and pain to palpation Cardio: Normal S1 and S2, RRR, no r/m/g Pulm: CTAB, normal work of breathing Extremities: No peripheral edema.  Neuro: Cranial nerves grossly intact   ASSESSMENT/PLAN:   Dental caries Suspect right side headache and nasal sinus pain related to dental caries.  Concern for development of infection Augmentin 875 BID x 7 days Good oral hygiene Follow up with dentist as soon as possible Strict return precautions provided Note for work provided    , MD [facility]

## 2021-04-21 NOTE — Patient Instructions (Addendum)
Thank you for coming to see me today. It was a pleasure.   You have an ear infection and tooth infection.   Start Amoxicillin/Clavulin twice a day for 10 days  Recommend you follow up with a dentist as soon as possible to have teeth evaluated.   You are due for an eye exam.  Please make sure that you call your eye doctor to have this scheduled and have them fax the results to our office.   Your Diabetic Foot exam is due. Please book an appointment with your PCP at your earliest convenience.  Please follow-up with PCP for continued health care maintenance  If you have any questions or concerns, please do not hesitate to call the office at [phone].  Best,   Dana Allan, MD

## 2021-04-22 ENCOUNTER — Telehealth: Payer: Self-pay | Admitting: Family Medicine

## 2021-04-22 NOTE — Telephone Encounter (Signed)
Patient is calling and would like to know if Dr. Volanda Napoleon can re write her excuse note for work. She said she would like more time for the meds to help the pain. She would like to be written out of work through Monday 04/25/21.  Please sent letter via mychart.

## 2021-04-23 NOTE — Telephone Encounter (Signed)
Sent via MyChart

## 2021-04-24 ENCOUNTER — Encounter: Payer: Self-pay | Admitting: Family Medicine

## 2021-04-24 DIAGNOSIS — K029 Dental caries, unspecified: Secondary | ICD-10-CM | POA: Insufficient documentation

## 2021-04-24 NOTE — Assessment & Plan Note (Signed)
Suspect right side headache and nasal sinus pain related to dental caries.  Concern for development of infection Augmentin 875 BID x 7 days Good oral hygiene Follow up with dentist as soon as possible Strict return precautions provided Note for work provided

## 2021-04-25 ENCOUNTER — Encounter: Payer: Self-pay | Admitting: Family Medicine

## 2021-04-25 NOTE — Telephone Encounter (Signed)
LVM for pt to call office back, wanted to be sure that she was able to retrieve her updated work letter from her mychart that the doctor re-wrote.Aydeen Blume Zimmerman Rumple, CMA

## 2021-04-27 ENCOUNTER — Encounter: Payer: Self-pay | Admitting: Family Medicine

## 2021-04-27 ENCOUNTER — Ambulatory Visit (INDEPENDENT_AMBULATORY_CARE_PROVIDER_SITE_OTHER): Payer: No Typology Code available for payment source | Admitting: Family Medicine

## 2021-04-27 ENCOUNTER — Other Ambulatory Visit: Payer: Self-pay

## 2021-04-27 VITALS — BP 124/77 | HR 102 | Ht 65.0 in | Wt 303.2 lb

## 2021-04-27 DIAGNOSIS — H9201 Otalgia, right ear: Secondary | ICD-10-CM

## 2021-04-27 DIAGNOSIS — Z23 Encounter for immunization: Secondary | ICD-10-CM | POA: Diagnosis not present

## 2021-04-27 DIAGNOSIS — K029 Dental caries, unspecified: Secondary | ICD-10-CM

## 2021-04-27 DIAGNOSIS — T50905A Adverse effect of unspecified drugs, medicaments and biological substances, initial encounter: Secondary | ICD-10-CM

## 2021-04-27 NOTE — Addendum Note (Signed)
Addended by: Lavell Anchors A on: 04/27/2021 11:17 AM   Modules accepted: Orders

## 2021-04-27 NOTE — Patient Instructions (Signed)
For your pain, I want you to start alternating Tylenol and Ibuprofen for your pain.  The ibuprofen will likely also help with any inflammation.  I recommend double checking the max doses per day on the box.  I am also giving you a letter to excuse you from work until you are able to have your surgery.  If you start to have fevers or worsening of your symptoms, please let our office know immediately.

## 2021-04-27 NOTE — Progress Notes (Signed)
° ° °  SUBJECTIVE:   CHIEF COMPLAINT / HPI:   Follow-up on her ear pain.  She notes that she had a dentist appointment yesterday and had an x-ray done and is going to be scheduled for her wisdom teeth removal bilaterally for Tuesday of next week.  She is having pain in her right ear and around the entire side of her face that is worse with palpation.  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  For pain she is using only Tylenol about 300 mg once daily.  She does note since starting the Augmentin last week she has been having some pains in her stomach with also some nausea and diarrhea.  PERTINENT  PMH / PSH: Reviewed  OBJECTIVE:   BP 124/77    Pulse (!) 102    Ht 5\' 5"  (1.651 m)    Wt (!) 303 lb 3.2 oz (137.5 kg)    SpO2 97%    BMI 50.46 kg/m   General: NAD, well-appearing, well-nourished Respiratory: No respiratory distress, breathing comfortably, able to speak in full sentences Skin: warm and dry, no rashes noted on exposed skin Psych: Appropriate affect and mood HEENT: Tender to palpation over the right side of the face, auricle able to be moved without pain.  Right TM with bubbles present, does not appear erythematous at this time  ASSESSMENT/PLAN:   Right ear pain Patient currently on Augmentin.  Right ear does not appear erythematous but does have bubbles present around the TM.  At this time, feel that there are no changes that need to be made with antibiotics and patient needs to proceed forward with surgery.  Discussed improved pain control with alternating Tylenol and ibuprofen.  Return precautions given.  Abdominal discomfort   medication side effects No red flag symptoms, patient is having some nausea and abdominal pain with diarrhea.  Patient is currently on Augmentin, at this time feel that it is a medication side effect  related to the antibiotics.  Discussed strict return precautions with patient  , DO [facility]

## 2021-05-04 ENCOUNTER — Ambulatory Visit: Payer: No Typology Code available for payment source | Admitting: Family Medicine

## 2021-05-05 ENCOUNTER — Other Ambulatory Visit: Payer: Self-pay

## 2021-05-05 ENCOUNTER — Ambulatory Visit (INDEPENDENT_AMBULATORY_CARE_PROVIDER_SITE_OTHER): Payer: No Typology Code available for payment source | Admitting: Family Medicine

## 2021-05-05 VITALS — BP 157/93 | HR 90 | Wt 303.4 lb

## 2021-05-05 DIAGNOSIS — H7291 Unspecified perforation of tympanic membrane, right ear: Secondary | ICD-10-CM | POA: Diagnosis not present

## 2021-05-05 DIAGNOSIS — H9201 Otalgia, right ear: Secondary | ICD-10-CM | POA: Diagnosis not present

## 2021-05-05 HISTORY — DX: Unspecified perforation of tympanic membrane, right ear: H72.91

## 2021-05-05 MED ORDER — IBUPROFEN 800 MG PO TABS: 800.0000 mg | ORAL_TABLET | Freq: Three times a day (TID) | ORAL | 0 refills | Status: AC | PRN

## 2021-05-05 MED ORDER — KETOROLAC TROMETHAMINE 15 MG/ML IJ SOLN
15.0000 mg | Freq: Once | INTRAMUSCULAR | Status: AC
  Administered 2021-05-05: 15 mg via INTRAVENOUS

## 2021-05-05 NOTE — Patient Instructions (Addendum)
It was a pleasure to see you today!  I have placed a referral for the Ear Nose and Throat doctor, or otolaryngology. You should receive a phone call in 1-2 weeks to schedule this appointment. If for any reason you do not receive a phone call or need more help scheduling this appointment, please call our office at [phone]. For pain, you received an injection of toradol today. You can use ibuprofen 800 mg every 8 hours for pain as needed. Also try warm wash cloths over your ear Follow up if pain is uncontrollable    Be Well,  Dr. Leary Roca

## 2021-05-05 NOTE — Progress Notes (Signed)
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  She right otitis externa on December 12 and topical antibiotics were given.  Patient then was seen on January 5 for sinus pain, pressure in setting of dental caries and was treated with Augmentin for 7 days, she completed the course.  She was then seen on January 11 for ear pain on the right.  Since that visit last week she has noticed aching pain in her right ear with most sounds.  She reports taking ibuprofen 600 mg every 6 hours and Tylenol 1000 mg every 6 hours without relief.  She reports that she had heartburn with naproxen back in 1999 and is not allergic to any NSAIDs.  DM2: Recommend patient follow-up closely for poorly controlled diabetes with PCP  HTN: BP not at goal, however patient is in pain today from ear. Recommend close follow up after her ear improves.  PERTINENT  PMH / PSH: AOM  OBJECTIVE:   BP (!) 157/93    Pulse 90    Wt (!) 303 lb 6.4 oz (137.6 kg)    SpO2 98%    BMI 50.49 kg/m   Nursing note and vitals reviewed GEN:  resting comfortably in chair, NAD, WNWD HEENT: NCAT. PERRLA. Sclera without injection or icterus. MMM. Clear oropharynx. Patent nares. TM on left n/b and n/e. Right TM non-erythematous, non-bulging, no exudate, small perforation in bottom left Neck: Supple. No LAD. Neuro: AOx3  Ext: no edema Psych: Pleasant and appropriate   ASSESSMENT/PLAN:   Perforation of right tympanic membrane Will trial rx strength 800 mg of ibuprofen to assess for pain control, pt was using OTC analgesics. Given Toradol 15 mg IM in office, normal renal function. Consider short dose of tramadol if no improvement. Refer to ENT appropriate given persistence of pain and repeat infections. Recommend patient get a new headset and clean it regularly.     Shirlean Mylar, MD [facility]

## 2021-05-05 NOTE — Assessment & Plan Note (Signed)
Will trial rx strength 800 mg of ibuprofen to assess for pain control, pt was using OTC analgesics. Given Toradol 15 mg IM in office, normal renal function. Consider short dose of tramadol if no improvement. Refer to ENT appropriate given persistence of pain and repeat infections. Recommend patient get a new headset and clean it regularly.

## 2021-05-12 NOTE — Progress Notes (Signed)
° ° °  SUBJECTIVE:   CHIEF COMPLAINT / HPI:   Diabetes, Type 2 - Last A1c >15 in December 2022 - Medications: Metformin-XR 500 mg, Semglee 36 U daily - Compliance: Great - Checking BG at home: Checks in the morning an hour after breakfast, range 120-180 - Diet: Chicken, fish, vegetables, sugar-free drinks - Exercise: Tries to walk, doesn't exercise much - Eye exam: Overdue  - Foot exam: Overdue  - Microalbumin: Done December 2022, appropriate - Statin: on Atorvastatin 20 mg  - Denies symptoms of hypoglycemia, polyuria, polydipsia, numbness extremities, foot ulcers/trauma  Hypertension Doesn't check BP cuff at home. Takes Amlodipine 10 mg and HCTZ 50 mg. Took her BP medications this AM. Asymptomatic.  PERTINENT  PMH / PSH: Asthma, HTN, HLD, obesity    OBJECTIVE:   BP (!) 139/91    Pulse 93    Wt (!) 306 lb 3.2 oz (138.9 kg)    SpO2 97%    BMI 50.95 kg/m    General: NAD, pleasant, able to participate in exam Respiratory: Breathing comfortably on room air, normal effort Abdomen: Obese abdomen Extremities: no edema or cyanosis. Foot exam: No deformities, ulcerations, or other skin breakdown on feet bilaterally. Skin is dry and slightly calloused on soles. Toenails are short and slightly thickened. Has laceration to right great toe from cutting nail too short- occurred today. Bleeding is controlled. Sensation intact to monofilament and light touch. Vibratory sensation intact b/l. PT and DP pulses intact BL.   Psych: Normal affect and mood  ASSESSMENT/PLAN:   Type 2 diabetes mellitus with hyperglycemia ([place]) Poorly controlled diabetes, last A1c in December was greater than 15.  Discussed complications with longstanding uncontrolled diabetes.  Patient was hesitant about medication changes today but was amenable to increasing her metformin to 500 mg twice daily.  Encouraged her to check fasting sugars to better guide her insulin dosage.  Instructions given to titrate insulin up by 3  units if her fasting sugars persistently greater than 120.  Offered pharmacy follow-up, patient declined for now.  Will send message to pharmacy team to see if her insurance will cover a GLP-1 as she would benefit from weight loss and diabetes control. -Referral to ophthalmology for diabetic eye exam -Foot exam performed today -Lipid panel today; will likely need to increase to atorvastatin 40 mg for high intensity -Recommended addition of ARB for renal protection; patient opted to wait until next visit for further discussion and after resolution of her ear infection  HTN (hypertension) Blood pressure elevated today, slightly improved on recheck but still not at goal. Asymptomatic.  On calcium channel blocker and thiazide.  Discussed benefit of ARB for renal protection given her diabetes, patient declined at this time would like to discuss more at follow-up visit. -Encouraged her to check blood pressures at home, record a log and bring to next visit -Continue amlodipine 10 mg, HCTZ 25 mg -At next visit we will discuss addition of [place]

## 2021-05-17 NOTE — Progress Notes (Signed)
° ° ° °  SUBJECTIVE:   CHIEF COMPLAINT / HPI:   Holly [name] is a 41 y.o. female presents for right ear pain  Right ear pain Pt works in call centre using headphones all day. In Dec 22 she was treated for otititis externa with cortisporin ear drops. Followed by this she had dental caries which was treated with Augmentin. The ear drops and PO abx resolved the infection for a week but now she feels it has come back.  Pt reports constant pain in the right ear and it is tender to touch. She has been taking ibuprofen which has helped the pain only a little. She has also noticed ivory and grey colored discharge for the last 3 days. Denies bleeding, fevers etc. She was referred to ENT at the last visit but the practice did not take her insurance so she would like another referral.   Flowsheet Row Office Visit from 05/18/2021 in [facility]  PHQ-9 Total Score 2        PERTINENT  PMH / PSH: eczema, carpal tunnel, HLD  OBJECTIVE:   BP 130/90    Pulse 96    Ht 5\' 5"  (1.651 m)    Wt (!) 303 lb 12.8 oz (137.8 kg)    SpO2 95%    BMI 50.55 kg/m    General: Alert, no acute distress HEENT: swollen right ear, tender right pinna, purulent discharge from right ear, unable to visualize right TM, normal left TM Cardio: well perfused  Pulm: normal work of breathing Neuro: Cranial nerves grossly intact   ASSESSMENT/PLAN:   Right otitis externa Recurrent otitis externa with hx of perforated right TM. Cultures obtained from purulent discharge today. Given failure of ear drops for otitis externa, pt should trial PO Abx now. Prescribed Augmentin 825mg  again for 7 days. Referred to different ENT practice. Strict return precautions given to pt.  Diabetes mellitus without complication (HCC) Explained that uncontrolled diabetes can increase risk of infections. Last A1c>15 2 months ago. Recommended close follow up with PCP for diabetes management.     , MD PGY-3 [facility]  [place]

## 2021-05-18 ENCOUNTER — Ambulatory Visit (INDEPENDENT_AMBULATORY_CARE_PROVIDER_SITE_OTHER): Payer: No Typology Code available for payment source | Admitting: Family Medicine

## 2021-05-18 ENCOUNTER — Ambulatory Visit: Payer: No Typology Code available for payment source

## 2021-05-18 ENCOUNTER — Other Ambulatory Visit: Payer: Self-pay

## 2021-05-18 VITALS — BP 130/90 | HR 96 | Ht 65.0 in | Wt 303.8 lb

## 2021-05-18 DIAGNOSIS — H6091 Unspecified otitis externa, right ear: Secondary | ICD-10-CM | POA: Diagnosis not present

## 2021-05-18 DIAGNOSIS — E119 Type 2 diabetes mellitus without complications: Secondary | ICD-10-CM

## 2021-05-18 MED ORDER — AMOXICILLIN-POT CLAVULANATE 875-125 MG PO TABS: 1.0000 | ORAL_TABLET | Freq: Two times a day (BID) | ORAL | 0 refills | Status: AC

## 2021-05-18 NOTE — Patient Instructions (Signed)
Thank you for coming to see me today. It was a pleasure. Today we discussed your ear infection-it is an external ear infection. I recommend taking augmentin again for 7 days as ear drops did not help, I have placed another referral for ENT.  Please follow-up if no improvement in symptoms   If you have any questions or concerns, please do not hesitate to call the office at [phone].  Best wishes,   Dr Allena Katz

## 2021-05-20 ENCOUNTER — Ambulatory Visit (INDEPENDENT_AMBULATORY_CARE_PROVIDER_SITE_OTHER): Payer: No Typology Code available for payment source | Admitting: Family Medicine

## 2021-05-20 ENCOUNTER — Other Ambulatory Visit (HOSPITAL_COMMUNITY): Payer: Self-pay

## 2021-05-20 ENCOUNTER — Other Ambulatory Visit: Payer: Self-pay

## 2021-05-20 ENCOUNTER — Encounter: Payer: Self-pay | Admitting: Family Medicine

## 2021-05-20 VITALS — BP 139/91 | HR 93 | Wt 306.2 lb

## 2021-05-20 DIAGNOSIS — E1165 Type 2 diabetes mellitus with hyperglycemia: Secondary | ICD-10-CM | POA: Diagnosis not present

## 2021-05-20 DIAGNOSIS — I10 Essential (primary) hypertension: Secondary | ICD-10-CM

## 2021-05-20 DIAGNOSIS — Z794 Long term (current) use of insulin: Secondary | ICD-10-CM | POA: Diagnosis not present

## 2021-05-20 MED ORDER — METFORMIN HCL ER 500 MG PO TB24: 500.0000 mg | ORAL_TABLET | Freq: Two times a day (BID) | ORAL | 3 refills | Status: DC

## 2021-05-20 NOTE — Assessment & Plan Note (Signed)
Explained that uncontrolled diabetes can increase risk of infections. Last A1c>15 2 months ago. Recommended close follow up with PCP for diabetes management.

## 2021-05-20 NOTE — Assessment & Plan Note (Signed)
Poorly controlled diabetes, last A1c in December was greater than 15.  Discussed complications with longstanding uncontrolled diabetes.  Patient was hesitant about medication changes today but was amenable to increasing her metformin to 500 mg twice daily.  Encouraged her to check fasting sugars to better guide her insulin dosage.  Instructions given to titrate insulin up by 3 units if her fasting sugars persistently greater than 120.  Offered pharmacy follow-up, patient declined for now.  Will send message to pharmacy team to see if her insurance will cover a GLP-1 as she would benefit from weight loss and diabetes control. -Referral to ophthalmology for diabetic eye exam -Foot exam performed today -Lipid panel today; will likely need to increase to atorvastatin 40 mg for high intensity -Recommended addition of ARB for renal protection; patient opted to wait until next visit for further discussion and after resolution of her ear infection

## 2021-05-20 NOTE — Patient Instructions (Addendum)
It was wonderful to see you today.  Please bring ALL of your medications with you to every visit.   Today we talked about:  We are doing lab work today to check cholesterol. I will send you a MyChart message if you have MyChart. Otherwise, I will give you a call for abnormal results or send a letter if everything returned back normal. If you don't hear from me in 2 weeks, please call the office.    We did your foot exam today. It is important that you check your feet frequently.   I will see you back in March for a diabetes follow up. I have increased your Metformin. You should take 500 mg twice a day.   Your blood pressure was elevated today. We discussed starting you on/potentially switching you to a blood pressure medication that can also help to protect your kidneys from damage. We can discuss further at your next visit. I would encourage you to get a blood pressure cuff to measure at home. If you do, please record your numbers and bring them with you next time.  I sent a referral for an eye examination. They will call you.   Thank you for choosing [facility].   Please call [phone] with any questions about today's appointment.  Please be sure to schedule follow up at the front  desk before you leave today.   Sabino Dick, DO PGY-2 Family Medicine

## 2021-05-20 NOTE — Assessment & Plan Note (Signed)
>>  ASSESSMENT AND PLAN FOR DIABETES MELLITUS WITHOUT COMPLICATION (HCC) WRITTEN ON 05/20/2021  8:54 AM BY PATEL, POONAM, MD  Explained that uncontrolled diabetes can increase risk of infections. Last A1c>15 2 months ago. Recommended close follow up with PCP for diabetes management.

## 2021-05-20 NOTE — Assessment & Plan Note (Addendum)
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 Cultures obtained from purulent discharge today. Given failure of ear drops for otitis externa, pt should trial PO Abx now. Prescribed Augmentin 825mg  again for 7 days. Referred to different ENT practice. Strict return precautions given to pt.

## 2021-05-20 NOTE — Assessment & Plan Note (Addendum)
Blood pressure elevated today, slightly improved on recheck but still not at goal. Asymptomatic.  On calcium channel blocker and thiazide.  Discussed benefit of ARB for renal protection given her diabetes, patient declined at this time would like to discuss more at follow-up visit. -Encouraged her to check blood pressures at home, record a log and bring to next visit -Continue amlodipine 10 mg, HCTZ 25 mg -At next visit we will discuss addition of ARB

## 2021-05-21 LAB — LIPID PANEL
Chol/HDL Ratio: 5 ratio — ABNORMAL HIGH (ref 0.0–4.4)
Cholesterol, Total: 151 mg/dL (ref 100–199)
HDL: 30 mg/dL — ABNORMAL LOW (ref >39)
LDL Chol Calc (NIH): 97 mg/dL (ref 0–99)
Triglycerides: 135 mg/dL (ref 0–149)
VLDL Cholesterol Cal: 24 mg/dL (ref 5–40)

## 2021-05-22 LAB — ANAEROBIC AND AEROBIC CULTURE

## 2021-05-23 ENCOUNTER — Other Ambulatory Visit: Payer: Self-pay | Admitting: Family Medicine

## 2021-05-23 MED ORDER — ATORVASTATIN CALCIUM 40 MG PO TABS: 40.0000 mg | ORAL_TABLET | Freq: Every day | ORAL | 3 refills | Status: DC

## 2021-05-23 NOTE — Progress Notes (Signed)
Switching to high-intensity Atorvastatin

## 2021-06-03 ENCOUNTER — Other Ambulatory Visit: Payer: Self-pay | Admitting: Family Medicine

## 2021-06-03 DIAGNOSIS — I10 Essential (primary) hypertension: Secondary | ICD-10-CM

## 2021-06-14 ENCOUNTER — Other Ambulatory Visit: Payer: Self-pay

## 2021-06-14 ENCOUNTER — Ambulatory Visit (INDEPENDENT_AMBULATORY_CARE_PROVIDER_SITE_OTHER): Payer: No Typology Code available for payment source | Admitting: Family Medicine

## 2021-06-14 ENCOUNTER — Encounter: Payer: Self-pay | Admitting: Family Medicine

## 2021-06-14 VITALS — BP 146/77 | HR 96 | Ht 65.0 in | Wt 304.8 lb

## 2021-06-14 DIAGNOSIS — M7631 Iliotibial band syndrome, right leg: Secondary | ICD-10-CM | POA: Diagnosis not present

## 2021-06-14 DIAGNOSIS — E119 Type 2 diabetes mellitus without complications: Secondary | ICD-10-CM

## 2021-06-14 MED ORDER — BACLOFEN 10 MG PO TABS: 10.0000 mg | ORAL_TABLET | Freq: Two times a day (BID) | ORAL | 0 refills | Status: AC | PRN

## 2021-06-14 NOTE — Assessment & Plan Note (Signed)
>>  ASSESSMENT AND PLAN FOR DIABETES MELLITUS WITHOUT COMPLICATION (HCC) WRITTEN ON 06/14/2021 12:51 PM BY ANDERS OTTO DASEN, MD  Insulin  dose updated. Advised f/u with PCP for DM and BP management. She agreed with the plan.

## 2021-06-14 NOTE — Progress Notes (Signed)
° ° °  SUBJECTIVE:   CHIEF COMPLAINT / HPI:   Leg Pain  Incident onset: Right leg pain x about one week ago. There was no injury mechanism. Pain goes up from her right calf through the lateral side of her knee and thigh, then to her hip. The quality of the Pain is described as aching. The Pain is at a severity of 0/10 (No pain right now. However, at times, it is like 8/10 in severity. Last episode was yesterday, about 5/10 in severity. Pain was daily, but now improved). The Pain has been Intermittent since onset. Pertinent negatives include no inability to bear weight, loss of motion, loss of sensation, muscle weakness, numbness, or tingling. Associated symptoms comments: No redness or swelling of her LL. Previously, she had issues with weight bearing, but that has since resolved. Exacerbated by: She sits 8 hours daily at work. Which makes her pain worse. She has tried NSAIDs for the symptoms.   DM: No concern. She self titrated her Insulin to 38 units daily.  PERTINENT  PMH / PSH: PMHx reviewed.  OBJECTIVE:   BP (!) 146/77    Pulse 96    Ht 5\' 5"  (1.651 m)    Wt (!) 304 lb 12.8 oz (138.3 kg)    SpO2 97%    BMI 50.72 kg/m     Physical Exam Vitals and nursing note reviewed.  Cardiovascular:     Rate and Rhythm: Normal rate and regular rhythm.     Heart sounds: Normal heart sounds. No murmur heard. Pulmonary:     Effort: Pulmonary effort is normal. No respiratory distress.     Breath sounds: Normal breath sounds. No wheezing.  Musculoskeletal:     Comments: Mild tenderness over her right trochanter. + Left knee crepitus with no tenderness No calf swelling or tenderness. No LL erythema.   Neurological:     Mental Status: She is alert.     ASSESSMENT/PLAN:  Right LL pain: Likely iliotibial band syndrome. Home exercise instruction provided. Continue NSAID prn pain. May add on Baclofen prn - prescription provided and she was advised to avoid use when at work or driving or operating  machinery. Return precautions discussed. She agreed with the plan.    Diabetes mellitus without complication (HCC) Insulin dose updated. Advised f/u with PCP for DM and BP management. She agreed with the plan.     Andrena Mews, MD [place]

## 2021-06-14 NOTE — Patient Instructions (Signed)
Hip Exercises °Ask your health care provider which exercises are safe for you. Do exercises exactly as told by your health care provider and adjust them as directed. It is normal to feel mild stretching, pulling, tightness, or discomfort as you do these exercises. Stop right away if you feel sudden pain or your pain gets worse. Do not begin these exercises until told by your health care provider. °Stretching and range-of-motion exercises °These exercises warm up your muscles and joints and improve the movement and flexibility of your hip. These exercises also help to relieve pain, numbness, and tingling. You may be asked to limit your range of motion if you had a hip replacement. Talk to your health care provider about these restrictions. °Hamstrings, supine ° °Lie on your back (supine position). °Loop a belt or towel over the ball of your left / right foot. The ball of your foot is on the walking surface, right under your toes. °Straighten your left / right knee and slowly pull on the belt or towel to raise your leg until you feel a gentle stretch behind your knee (hamstring). °Do not let your knee bend while you do this. °Keep your other leg flat on the floor. °Hold this position for __________ seconds. °Slowly return your leg to the starting position. °Repeat __________ times. Complete this exercise __________ times a day. °Hip rotation ° °Lie on your back on a firm surface. °With your left / right hand, gently pull your left / right knee toward the shoulder that is on the same side of the body. Stop when your knee is pointing toward the ceiling. °Hold your left / right ankle with your other hand. °Keeping your knee steady, gently pull your left / right ankle toward your other shoulder until you feel a stretch in your buttocks. °Keep your hips and shoulders firmly planted while you do this stretch. °Hold this position for __________ seconds. °Repeat __________ times. Complete this exercise __________ times a  day. °Seated stretch °This exercise is sometimes called hamstrings and adductors stretch. °Sit on the floor with your legs stretched wide. Keep your knees straight during this exercise. °Keeping your head and back in a straight line, bend at your waist to reach for your left foot (position A). You should feel a stretch in your right inner thigh (adductors). °Hold this position for __________ seconds. Then slowly return to the upright position. °Keeping your head and back in a straight line, bend at your waist to reach forward (position B). You should feel a stretch behind both of your thighs and knees (hamstrings). °Hold this position for __________ seconds. Then slowly return to the upright position. °Keeping your head and back in a straight line, bend at your waist to reach for your right foot (position C). You should feel a stretch in your left inner thigh (adductors). °Hold this position for __________ seconds. Then slowly return to the upright position. °Repeat __________ times. Complete this exercise __________ times a day. °Lunge °This exercise stretches the muscles of the hip (hip flexors). °Place your left / right knee on the floor and bend your other knee so that is directly over your ankle. You should be half-kneeling. °Keep good posture with your head over your shoulders. °Tighten your buttocks to point your tailbone downward. This will prevent your back from arching too much. °You should feel a gentle stretch in the front of your left / right thigh and hip. If you do not feel a stretch, slide your other foot   forward slightly and then slowly lunge forward with your chest up until your knee once again lines up over your ankle. °Make sure your tailbone continues to point downward. °Hold this position for __________ seconds. °Slowly return to the starting position. °Repeat __________ times. Complete this exercise __________ times a day. °Strengthening exercises °These exercises build strength and endurance  in your hip. Endurance is the ability to use your muscles for a long time, even after they get tired. °Bridge °This exercise strengthens the muscles of your hip (hip extensors). °Lie on your back on a firm surface with your knees bent and your feet flat on the floor. °Tighten your buttocks muscles and lift your bottom off the floor until the trunk of your body and your hips are level with your thighs. °Do not arch your back. °You should feel the muscles working in your buttocks and the back of your thighs. If you do not feel these muscles, slide your feet 1-2 inches (2.5-5 cm) farther away from your buttocks. °Hold this position for __________ seconds. °Slowly lower your hips to the starting position. °Let your muscles relax completely between repetitions. °Repeat __________ times. Complete this exercise __________ times a day. °Straight leg raises, side-lying °This exercise strengthens the muscles that move the hip joint away from the center of the body (hip abductors). °Lie on your side with your left / right leg in the top position. Lie so your head, shoulder, hip, and knee line up. You may bend your bottom knee slightly to help you balance. °Roll your hips slightly forward, so your hips are stacked directly over each other and your left / right knee is facing forward. °Leading with your heel, lift your top leg 4-6 inches (10-15 cm). You should feel the muscles in your top hip lifting. °Do not let your foot drift forward. °Do not let your knee roll toward the ceiling. °Hold this position for __________ seconds. °Slowly return to the starting position. °Let your muscles relax completely between repetitions. °Repeat __________ times. Complete this exercise __________ times a day. °Straight leg raises, side-lying °This exercise strengthens the muscles that move the hip joint toward the center of the body (hip adductors). °Lie on your side with your left / right leg in the bottom position. Lie so your head, shoulder,  hip, and knee line up. You may place your upper foot in front to help you balance. °Roll your hips slightly forward, so your hips are stacked directly over each other and your left / right knee is facing forward. °Tense the muscles in your inner thigh and lift your bottom leg 4-6 inches (10-15 cm). °Hold this position for __________ seconds. °Slowly return to the starting position. °Let your muscles relax completely between repetitions. °Repeat __________ times. Complete this exercise __________ times a day. °Straight leg raises, supine °This exercise strengthens the muscles in the front of your thigh (quadriceps). °Lie on your back (supine position) with your left / right leg extended and your other knee bent. °Tense the muscles in the front of your left / right thigh. You should see your kneecap slide up or see increased dimpling just above your knee. °Keep these muscles tight as you raise your leg 4-6 inches (10-15 cm) off the floor. Do not let your knee bend. °Hold this position for __________ seconds. °Keep these muscles tense as you lower your leg. °Relax the muscles slowly and completely between repetitions. °Repeat __________ times. Complete this exercise __________ times a day. °Hip abductors, standing °This   exercise strengthens the muscles that move the leg and hip joint away from the center of the body (hip abductors). °Tie one end of a rubber exercise band or tubing to a secure surface, such as a chair, table, or pole. °Loop the other end of the band or tubing around your left / right ankle. °Keeping your ankle with the band or tubing directly opposite the secured end, step away until there is tension in the tubing or band. Hold on to a chair, table, or pole as needed for balance. °Lift your left / right leg out to your side. While you do this: °Keep your back upright. °Keep your shoulders over your hips. °Keep your toes pointing forward. °Make sure to use your hip muscles to slowly lift your leg. Do not  tip your body or forcefully lift your leg. °Hold this position for __________ seconds. °Slowly return to the starting position. °Repeat __________ times. Complete this exercise __________ times a day. °Squats °This exercise strengthens the muscles in the front of your thigh (quadriceps). °Stand in a door frame so your feet and knees are in line with the frame. You may place your hands on the frame for balance. °Slowly bend your knees and lower your hips like you are going to sit in a chair. °Keep your lower legs in a straight-up-and-down position. °Do not let your hips go lower than your knees. °Do not bend your knees lower than told by your health care provider. °If your hip pain increases, do not bend as low. °Hold this position for ___________ seconds. °Slowly push with your legs to return to standing. Do not use your hands to pull yourself to standing. °Repeat __________ times. Complete this exercise __________ times a day. °This information is not intended to replace advice given to you by your health care provider. Make sure you discuss any questions you have with your health care provider. °Document Revised: 08/18/2020 Document Reviewed: 08/18/2020 °Elsevier Patient Education © 2022 [employer] ° °

## 2021-06-14 NOTE — Patient Instructions (Addendum)
It was wonderful to see you today. ? ?Please bring ALL of your medications with you to every visit.  ? ?Today we talked about: ? ?-Your A1c was 13.6 today, which is an improvement! It still not where we would like it to be however, so continue your efforts to exercise and improve your diet.  ?-We are starting you on mealtime insulin. Take 5 units of the Novolog before breakfast and before dinner each day. ?-Continue to take your long-acting insulin daily.  ?-Try to monitor your blood pressure at home and record them. Bring them to your next appointment.  ?-You are overdue for a diabetic eye examination. Their information is listed below. ?[address] ?[place], [place] [address] ?[phone] ? ? ?Diet Recommendations for Diabetes  ?Carbohydrate includes starch, sugar, and fiber.  Of these, only sugar and starch raise blood glucose.  (Fiber is found in fruits, vegetables [especially skin, seeds, and stalks], whole grains, and beans.)   ?Starchy (carb) foods: Bread, rice, pasta, potatoes, corn, cereal, grits, crackers, bagels, muffins, all baked goods.  (Fruit, milk, and yogurt also have carbohydrate, but most of these foods will not spike your blood sugar as most starchy or sweet foods will.)  A few fruits do cause high blood sugars; use small portions of bananas (limit to 1/2 at a time), grapes, watermelon, and oranges.   ?Protein foods: Meat, fish, poultry, eggs, dairy foods, and beans such as pinto and kidney beans (beans also provide carbohydrate).  ? ?1. Eat at least 3 REAL meals and 1-2 snacks per day. Eat breakfast within the first hour of getting up.  Have something to eat at least every 5 hours while awake.   ?2. Limit starchy foods to TWO per meal and ONE per snack. ONE portion of a starchy food is equal to the following:  ? - ONE slice of bread (or its equivalent, such as half of a hamburger bun).  ? - 1/2 cup of a "scoopable" starchy food such as potatoes or rice.  ? - 15 grams of Total Carbohydrate  as shown on food label.  ? - Every 4 ounces of a sweet drink (including fruit juice). ?3. Include twice the volume of vegetables as protein or carbohydrate foods for BOTH lunch and dinner as often as you can.  ? - Fresh or frozen vegetables are best.  ? - Keep frozen vegetables on hand for a quick option.    ? ?  ? ? ? ?Thank you for choosing [facility].  ? ?Please call [phone] with any questions about today's appointment. ? ?Please be sure to schedule follow up at the front  desk before you leave today.  ? ?Sabino Dick, DO ?PGY-2 Family Medicine   ?

## 2021-06-14 NOTE — Progress Notes (Signed)
? ? ?  SUBJECTIVE:  ? ?CHIEF COMPLAINT / HPI:  ? ?Type 2 DM: F/u  ?Holly [name] is a 41 y.o. female who presents to the [facility] clinic today for f/u on diabetes. Current medications include Metformin 500 mg BID and 38U insulin. Reports home glucose readings range 150-170 not fasting. She checks about once a day. Last A1c in December was >15. She denies any symptoms of hypoglycemia. Denies polyuria, polydipsia. She is on a high-intensity statin (Atorvastatin 40 mg), not on an ARB (preferred to wait at last visit). Foot exam UTD, done at last visit on 05/20/21. She has started to increase her physical activity - she is walking the dog. She also reports changing which foods she buys- is buying more fish, [place], sugar-free juice, beyond burgers. ? ?Breakfast: normally has Cheerios or oatmeal. ?Lunch: tend to be smaller meals. Sometimes only has broccoli.  ?Dinner: Tends to be biggest meal ? ?Hypertension ?Currently taking hydrochlorothiazide 50 mg daily, amlodipine 10 mg daily. Doesn't check home BP often but does have a wrist monitor. Reports adherence to medications. Denies chest pain, headache, lower extremity edema, shortness of breath, vision changes.  ? ?PERTINENT  PMH / PSH:  ?Past Medical History:  ?Diagnosis Date  ? Eczema   ? Hx: of  ? Pregnancy   ? Pregnancy induced hypertension   ? Seasonal allergies   ? Hx: of  ? ? ?OBJECTIVE:  ? ?BP 135/67   Pulse 94   Wt (!) 306 lb 9.6 oz (139.1 kg)   SpO2 100%   BMI 51.02 kg/m?   ?Vitals:  ? 06/17/21 0959 06/17/21 1015  ?BP: (!) 149/86 135/67  ?Pulse: 94   ?SpO2: 100%   ? ?General: NAD, pleasant, able to participate in exam ?Extremities: no edema or cyanosis. ?Skin: warm and dry, no rashes noted ?Psych: Normal affect and mood ? ?ASSESSMENT/PLAN:  ? ?Type 2 diabetes mellitus with hyperglycemia (HCC) ?A1c improved today. Congratulated patient on her efforts and that we are headed in the right direction.  ?-Added 5U mealtime Novolog ?-Continue Metformin 500 mg  BID ?-Continue Semglee 38U daily ?-F/u with Dr. Raymondo Band in 1 month; can adjust insulin as needed ?-F/u with me in 3 months ?-Due for ophthalmology exam; patient is going to look into her which places will accept her insurance ?-on high-intensity statin  ?-Continue diet and exercise improvements ? ?HTN (hypertension) ?Elevated at first, improved on recheck. Compliant on regimen. Working towards weight loss and exercising more.  ?-Continue current regimen ?-Consider addition of ARB given diabetes ?  ? ? ?Sabino Dick, DO ?[facility]  ? ?

## 2021-06-14 NOTE — Assessment & Plan Note (Signed)
Insulin dose updated. Advised f/u with PCP for DM and BP management. She agreed with the plan.

## 2021-06-17 ENCOUNTER — Other Ambulatory Visit: Payer: Self-pay

## 2021-06-17 ENCOUNTER — Ambulatory Visit (INDEPENDENT_AMBULATORY_CARE_PROVIDER_SITE_OTHER): Payer: No Typology Code available for payment source | Admitting: Family Medicine

## 2021-06-17 VITALS — BP 135/67 | HR 94 | Wt 306.6 lb

## 2021-06-17 DIAGNOSIS — E1165 Type 2 diabetes mellitus with hyperglycemia: Secondary | ICD-10-CM | POA: Diagnosis not present

## 2021-06-17 DIAGNOSIS — I10 Essential (primary) hypertension: Secondary | ICD-10-CM

## 2021-06-17 DIAGNOSIS — Z794 Long term (current) use of insulin: Secondary | ICD-10-CM | POA: Diagnosis not present

## 2021-06-17 LAB — POCT GLYCOSYLATED HEMOGLOBIN (HGB A1C): HbA1c, POC (controlled diabetic range): 13.6 % — AB (ref 0.0–7.0)

## 2021-06-17 MED ORDER — INSULIN ASPART 100 UNIT/ML IJ SOLN: 5.0000 [IU] | Freq: Two times a day (BID) | INTRAMUSCULAR | 3 refills | Status: DC

## 2021-06-17 NOTE — Assessment & Plan Note (Addendum)
A1c improved today. Congratulated patient on her efforts and that we are headed in the right direction.  ?-Added 5U mealtime Novolog ?-Continue Metformin 500 mg BID ?-Continue Semglee 38U daily ?-F/u with Dr. Raymondo Band in 1 month; can adjust insulin as needed ?-F/u with me in 3 months ?-Due for ophthalmology exam; patient is going to look into her which places will accept her insurance ?-on high-intensity statin  ?-Continue diet and exercise improvements ?

## 2021-06-17 NOTE — Assessment & Plan Note (Signed)
Elevated at first, improved on recheck. Compliant on regimen. Working towards weight loss and exercising more.  ?-Continue current regimen ?-Consider addition of ARB given diabetes ?

## 2021-06-20 ENCOUNTER — Ambulatory Visit (INDEPENDENT_AMBULATORY_CARE_PROVIDER_SITE_OTHER): Payer: No Typology Code available for payment source | Admitting: Student

## 2021-06-20 ENCOUNTER — Encounter: Payer: Self-pay | Admitting: Student

## 2021-06-20 ENCOUNTER — Other Ambulatory Visit: Payer: Self-pay

## 2021-06-20 VITALS — BP 130/79 | HR 88 | Ht 60.0 in | Wt 308.4 lb

## 2021-06-20 DIAGNOSIS — Z794 Long term (current) use of insulin: Secondary | ICD-10-CM

## 2021-06-20 DIAGNOSIS — R200 Anesthesia of skin: Secondary | ICD-10-CM

## 2021-06-20 DIAGNOSIS — E1165 Type 2 diabetes mellitus with hyperglycemia: Secondary | ICD-10-CM

## 2021-06-20 MED ORDER — INSULIN LISPRO 100 UNIT/ML IJ SOLN: 5.0000 [IU] | Freq: Two times a day (BID) | INTRAMUSCULAR | 11 refills | Status: DC

## 2021-06-20 NOTE — Progress Notes (Signed)
? ? ?  SUBJECTIVE:  ? ?CHIEF COMPLAINT / HPI:  ? 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 At first she was not concerned when she stood up but realized it continued to be numb the next day. Says it started out that the whole bottom of her foot was numb but not it is on the ball of her foot and big toe. Denies any foot pain.  Denies any pins and needle sensation. Has had some back pain that she was prescribed baclofen for that helps. ? ?T2DM ?Was recently prescribed novolog 5 U with meals but says the pharmacy was going to charge her $200. Has been taking 40 units of lantus and had increased her metformin to 2 tablets in the morning and 1 at night. Does havve diarrhea with metformin but she says she tolerates this. ? ?PERTINENT  PMH / PSH: T2DM, ? ?OBJECTIVE:  ? ?BP 130/79   Pulse 88   Ht 5' (1.524 m)   Wt (!) 308 lb 6 oz (139.9 kg)   SpO2 100%   BMI 60.23 kg/m?   ?General: Well appearing, NAD, awake, alert, responsive to questions ?Gait normal ?Extremities: some pain with internal rotation of hip, straight leg test negative, mild pain with dorsiflexion of foot. Strength on foot exam testin 5/5 for dorsiflexison and plantar flexion. Foot appeared to have good capillary refill.  ?No LE edema or calf pain. ?Neuro: No focal deficits ?Skin: No rashes or lesions visualized  ? ?ASSESSMENT/PLAN:  ? ?Type 2 diabetes mellitus with hyperglycemia (HCC) ?Mealtime 5U novolg switched to humalog given medication cost. Advised patient to contact office if having issues filling. ?-F/u with PCP ?  ? ?2. Numbness of right foot ?Discussed red flag symptoms with patient which she does not have currently.  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  Discussed frequent movement/repositioning as treatment. Advised if not better or worsens in 4 weeks to return to clinic. ? ?Levin Erp, MD ?[facility]  ?

## 2021-06-20 NOTE — Assessment & Plan Note (Signed)
Mealtime 5U novolg switched to humalog given medication cost. Advised patient to contact office if having issues filling. ?-F/u with PCP ?

## 2021-06-20 NOTE — Patient Instructions (Addendum)
It was great to see you! Thank you for allowing me to participate in your care!  ? ?I recommend that you always bring your medications to each appointment as this makes it easy to ensure we are on the correct medications and helps [place] not miss when refills are needed. ? ?Our plans for today:  ?- I believe that your foot this is due to a nerve that got a little bit compressed.  Please make sure to shift your position every 30 minutes and not to put a ton of pressure on that one side.  ROHO mattresses are an option to shift pressure as well. ?-Make sure to see if this gets better in 4 weeks if you feel like it is getting worse or is not resolved in 4 weeks please return.   ?-If you are having consistent foot pain or weakness or issues with bladder/bowel incontinence please return to care ?-I have switched your NovoLog to Humalog 5 units with meals because of your medication cost, please let us know if this is more than $35 ? ? ?Take care and seek immediate care sooner if you develop any concerns. Please remember to show up 15 minutes before your scheduled appointment time! ? ?Levin Erp, MD ?[facility]  ?

## 2021-07-12 ENCOUNTER — Encounter: Payer: Self-pay | Admitting: Family Medicine

## 2021-07-14 ENCOUNTER — Other Ambulatory Visit (HOSPITAL_COMMUNITY): Payer: Self-pay

## 2021-07-14 ENCOUNTER — Encounter: Payer: Self-pay | Admitting: Family Medicine

## 2021-07-15 ENCOUNTER — Ambulatory Visit (INDEPENDENT_AMBULATORY_CARE_PROVIDER_SITE_OTHER): Payer: No Typology Code available for payment source | Admitting: Pharmacist

## 2021-07-15 ENCOUNTER — Other Ambulatory Visit (HOSPITAL_COMMUNITY): Payer: Self-pay

## 2021-07-15 DIAGNOSIS — J4520 Mild intermittent asthma, uncomplicated: Secondary | ICD-10-CM

## 2021-07-15 DIAGNOSIS — E119 Type 2 diabetes mellitus without complications: Secondary | ICD-10-CM

## 2021-07-15 DIAGNOSIS — E1165 Type 2 diabetes mellitus with hyperglycemia: Secondary | ICD-10-CM

## 2021-07-15 DIAGNOSIS — Z794 Long term (current) use of insulin: Secondary | ICD-10-CM

## 2021-07-15 MED ORDER — MOUNJARO 2.5 MG/0.5ML ~~LOC~~ SOAJ: 2.5000 mg | SUBCUTANEOUS | 2 refills | Status: DC

## 2021-07-15 MED ORDER — EMPAGLIFLOZIN 10 MG PO TABS
10.0000 mg | ORAL_TABLET | Freq: Every day | ORAL | 3 refills | Status: DC
Start: 2021-07-15 — End: 2021-11-22

## 2021-07-15 MED ORDER — ALBUTEROL SULFATE HFA 108 (90 BASE) MCG/ACT IN AERS: 2.0000 | INHALATION_SPRAY | Freq: Four times a day (QID) | RESPIRATORY_TRACT | 2 refills | Status: AC | PRN

## 2021-07-15 MED ORDER — INSULIN LISPRO 100 UNIT/ML IJ SOLN: 10.0000 [IU] | Freq: Two times a day (BID) | INTRAMUSCULAR | 11 refills | Status: DC

## 2021-07-15 MED ORDER — FREESTYLE LIBRE 2 SENSOR MISC
1.0000 | 11 refills | Status: DC
Start: 2021-07-15 — End: 2022-10-13

## 2021-07-15 MED ORDER — BASAGLAR KWIKPEN 100 UNIT/ML ~~LOC~~ SOPN: 40.0000 [IU] | PEN_INJECTOR | Freq: Every day | SUBCUTANEOUS | 11 refills | Status: DC

## 2021-07-15 NOTE — Patient Instructions (Addendum)
Nice to see you today! ? ?Today we started you on a Freestyle Libre so that you can monitor your blood sugar closely. Each sensor will last 14 days, and you will need to scan the sensor using the app on your phone at least three times a day to ensure continuous readings. If you have a blood glucose reading that seems very high or very low, you should verify it with a fingerstick reading.  ? ?We also started tirzepatide [facility]) 2.5 mg to help with blood sugar and weight loss. You will inject this in the abdomen once weekly. We have provided samples that will last a month. Hortencia Pilar from the [facility] will be in touch regarding prescription coverage. ? ?We sent a prescription for empagliflozin (Jardiance) to your pharmacy. When you are able to pick this up, do not start taking it until after your next appointment. ? ?Next appointment: April 28 at 8:30 AM ?

## 2021-07-15 NOTE — Progress Notes (Signed)
? ? ?S:    ? ?Chief Complaint  ?Patient presents with  ? Medication Management  ?  DM f/u  ? ?Holly [name] is a 41 y.o. female who presents for diabetes evaluation, education, and management. PMH is significant for diabetes, hypertension. Patient was referred and last seen by Primary Care Provider, Dr. Melba Coon, on 06/17/21.  ? ?Today, patient arrives in good spirits and presents without assistance. She reports that she needs refills on her albuterol inhaler. She typically uses it in spring and summer following strenuous activities. Patient also reports that she used to take apple cider vinegar and that it helped with her blood sugar. She states that she plans to start taking it again.  ? ?Patient reports Diabetes was diagnosed in 08/2019.  ? ?Current diabetes medications include: insulin glargine (Semglee) 40 units daily, metformin XR 500 mg - 2 tablets every morning and 1 tablet every evening ?Current hypertension medications include: hydrochlorothiazide 50 mg daily, amlodipine 10 mg daily  ?Current hyperlipidemia medications include: atorvastatin 40 mg daily  ? ?Patient reports taking all medications as prescribed. Patient reports adherence with medications. Patient reports that she does not miss her medications. ? ?Do you feel that your medications are working for you? Yes, patient feels like her medications are working well for her. She does report that she doesn't like taking medication in general. ?Have you been experiencing any side effects to the medications prescribed? Yes. She has diarrhea with metformin but reports that it has not worsened with her recent dose increase ?Do you have any problems obtaining medications due to transportation or finances? Yes  ?Insurance coverage: Through employer; [employer] ? ?Patient denies hypoglycemic events. ? ?Reported home fasting blood sugars: 210   ?Reported 2 hour post-meal/random blood sugars: 270-300s. Patient reports not seeing blood sugars in the 300s as  much since her metformin dose was increased. ? ?Patient denies nocturia (nighttime urination).  ?Patient reports neuropathy (nerve pain) that occurred about 1 month ago. She characterizes it as pain in both legs that occurred after sitting for a long time. She took a muscle relaxer and it has since resolved. ?Patient denies visual changes. ?Patient reports self foot exams.  ? ?Patient reported dietary habits: Eats 3 meals/day ?Breakfast: instant oatmeal - maple flavor  ?Lunch: vegetable sandwich, smoothie ?Dinner: salad, spaghetti ?Snacks: rare but fruit or nuts if she does ?Drinks: sugar-free juice, gallon of water per day ?She tries to buy foods that say "low sugar" on the label ? ?Patient denies working with a dietitian before. She reports looking up different diets. Patient reports she tries to eat frozen vegetables with at least 1 meal in the day. She reports purchasing no-salt seasonings now.  ? 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 Her main form of exercise now is walking the dog.  ? ?Patient denies checking her blood pressure outside of office. She does not have access to a blood pressure cuff but when she was seeing average blood pressures of 120-130s/80s.  ? ?O:  ?Physical Exam ?Vitals reviewed.  ?Constitutional:   ?   Appearance: Normal appearance. She is obese.  ?Neurological:  ?   Mental Status: She is alert.  ?Psychiatric:     ?   Mood and Affect: Mood normal.     ?   Behavior: Behavior normal.     ?   Thought Content: Thought content  normal.     ?   Judgment: Judgment normal.  ? ? ?Review of Systems  ?All other systems reviewed and are negative. ? ? ?Lab Results  ?Component Value Date  ? HGBA1C 13.6 (A) 06/17/2021  ? ?There were no vitals filed for this visit. ? ?Lipid Panel  ?   ?Component Value Date/Time  ? CHOL 151 05/20/2021 1101  ? TRIG 135 05/20/2021 1101  ? HDL 30  (L) 05/20/2021 1101  ? CHOLHDL 5.0 (H) 05/20/2021 1101  ? CHOLHDL 5.4 (H) 08/05/2015 1408  ? VLDL 26 08/05/2015 1408  ? LDLCALC 97 05/20/2021 1101  ? ? ?A/P: ?Diabetes longstanding and currently uncontrolled. Medication adherence appears optimal. Control is suboptimal due to diet, obesity. ?-Continued basal insulin Semglee (insulin glargine) at 40 units daily. ?-Started GLP-1 Mounjaro (tirzepatide) at 2.5 mg once weekly.  ?-Sent in a prescription for SGLT2-I Jardiance (empagliflozin) 10 mg daily. Patient instructed to wait to start this medication until after her next appointment.  ?-Continued metformin XR 1000 mg every morning and 500 mg every evening. ?-Placed a Freestyle Libre 2 in office. Patient counseled on purpose and proper use.  ?-Patient educated on purpose, proper use, and potential adverse effects of tirzepatide. First dose was administered by patient in the office.  ?-Extensively discussed pathophysiology of diabetes, recommended lifestyle interventions, dietary effects on blood sugar control.  ?-Counseled on s/sx of and management of hypoglycemia.  ?-Next A1c anticipated June 2023.  ? ?Medication Samples have been provided to the patient. ? ?Drug name: mounjaro (tirzepitide)       Strength: 2.5mg          Qty: 4 pens  LOT: [id number]  Exp.Date: 03/24/2023 ? ?Dosing instructions: 2.5mg  once weekly.  ? ?The patient has been instructed regarding the correct time, dose, and frequency of taking this medication, including desired effects and most common side effects.  ? ?Holly [name] ?10:25 AM ?07/15/2021 ? ?Holly [name] has a diagnosis of diabetes, checks blood glucose readings multiple times per day, treats with two-three insulin injections, and requires frequent adjustments to insulin regimen. This patient will be seen every six months, minimally, to assess adherence to their CGM regimen and diabetes treatment plan.  ? ? ?Asthma longstanding and currently controlled. Mainly appears to be seasonal and related  to exertion. ?-Continued albuterol 2 puffs every 6 hours as needed - Refill provided.  ? ?Written patient instructions provided. Patient verbalized understanding of treatment plan. Total time in face to face counseling 54 minutes.   ? ?Follow up pharmacist clinic visit on 08/12/2021 at 8:30 AM. Patient seen with Bartolo Darter PharmD Candidate and Lissa Merlin, PharmD, PGY 1 pharmacy resident.  ? ?

## 2021-07-15 NOTE — Assessment & Plan Note (Signed)
Diabetes longstanding and currently uncontrolled. Medication adherence appears optimal. Control is suboptimal due to diet, obesity. ?-Continued basal insulin Semglee (insulin glargine) at 40 units daily. ?-Started GLP-1 Mounjaro (tirzepatide) at 2.5 mg once weekly.  ?-Sent in a prescription for SGLT2-I Jardiance (empagliflozin) 10 mg daily. Patient instructed to wait to start this medication until after her next appointment.  ?-Continued metformin XR 1000 mg every morning and 500 mg every evening. ?-Placed a Freestyle Libre 2 in office. Patient counseled on purpose and proper use.  ?-Patient educated on purpose, proper use, and potential adverse effects of tirzepatide. First dose was administered by patient in the office.  ?-Extensively discussed pathophysiology of diabetes, recommended lifestyle interventions, dietary effects on blood sugar control.  ?-Counseled on s/sx of and management of hypoglycemia.  ?-Next A1c anticipated June 2023.  ?

## 2021-07-15 NOTE — Progress Notes (Signed)
Reviewed: I agree with Dr. Koval's documentation and management. 

## 2021-07-15 NOTE — Assessment & Plan Note (Signed)
>>  ASSESSMENT AND PLAN FOR DIABETES MELLITUS WITHOUT COMPLICATION ([facility]) WRITTEN ON 07/15/2021 10:18 AM BY KOVAL, PETER G, RPH-CPP  Diabetes longstanding and currently uncontrolled. Medication adherence appears optimal. Control is suboptimal due to diet, obesity. -Continued basal insulin  Semglee  (insulin  glargine) at 40 units daily. -Started GLP-1 Mounjaro  (tirzepatide ) at 2.5 mg once weekly.  -Sent in a prescription for SGLT2-I Jardiance  (empagliflozin ) 10 mg daily. Patient instructed to wait to start this medication until after her next appointment.  -Continued metformin  XR 1000 mg every morning and 500 mg every evening. -Placed a Freestyle Libre 2 in office. Patient counseled on purpose and proper use.  -Patient educated on purpose, proper use, and potential adverse effects of tirzepatide . First dose was administered by patient in the office.  -Extensively discussed pathophysiology of diabetes, recommended lifestyle interventions, dietary effects on blood sugar control.  -Counseled on s/sx of and management of hypoglycemia.  -Next A1c anticipated June 2023.

## 2021-07-21 ENCOUNTER — Other Ambulatory Visit (HOSPITAL_COMMUNITY): Payer: Self-pay

## 2021-07-27 ENCOUNTER — Ambulatory Visit
Admission: RE | Admit: 2021-07-27 | Discharge: 2021-07-27 | Disposition: A | Discharge status: Home / Self Care | Payer: No Typology Code available for payment source | Source: Ambulatory Visit | Attending: Family Medicine | Admitting: Family Medicine

## 2021-07-27 ENCOUNTER — Encounter: Payer: Self-pay | Admitting: Family Medicine

## 2021-07-27 ENCOUNTER — Ambulatory Visit (INDEPENDENT_AMBULATORY_CARE_PROVIDER_SITE_OTHER): Payer: No Typology Code available for payment source | Admitting: Family Medicine

## 2021-07-27 VITALS — BP 117/80 | HR 90 | Temp 98.4°F | Wt 305.0 lb

## 2021-07-27 DIAGNOSIS — R509 Fever, unspecified: Secondary | ICD-10-CM

## 2021-07-27 DIAGNOSIS — R6889 Other general symptoms and signs: Secondary | ICD-10-CM

## 2021-07-27 DIAGNOSIS — R051 Acute cough: Secondary | ICD-10-CM

## 2021-07-27 DIAGNOSIS — M791 Myalgia, unspecified site: Secondary | ICD-10-CM

## 2021-07-27 DIAGNOSIS — R042 Hemoptysis: Secondary | ICD-10-CM

## 2021-07-27 DIAGNOSIS — J029 Acute pharyngitis, unspecified: Secondary | ICD-10-CM | POA: Diagnosis not present

## 2021-07-27 LAB — POCT RAPID STREP A (OFFICE): Rapid Strep A Screen: NEGATIVE

## 2021-07-27 NOTE — Progress Notes (Signed)
? ? ?  SUBJECTIVE:  ? ?CHIEF COMPLAINT / HPI: Sore throat ? ?Patient reports that she has been experiencing sore throat it started 4 days ago. ?She states that it has been painful to swallow ?She reports having nausea and fatigue as well as some congestion ?She reports new mild productive cough with hemoptysis ?Patient reports episode of diarrhea ?She reports visiting family over the weekend and 2 family members were sick at that time ?She is unaware of any fevers objectively but reports feeling warm ? ? ?PERTINENT  PMH / PSH:  ?Hypertension ?Asthma ?Diabetes, type II ? ?OBJECTIVE:  ? ?BP 117/80   Pulse 90   Temp 98.4 ?F (36.9 ?C)   Wt (!) 305 lb (138.3 kg)   SpO2 98%   BMI 49.23 kg/m?   ?Physical Exam ?Constitutional:   ?   General: She is not in acute distress. ?   Appearance: She is obese. She is ill-appearing. She is not toxic-appearing.  ?HENT:  ?   Right Ear: Ear canal normal. No drainage. No middle ear effusion. Tympanic membrane is not erythematous.  ?   Left Ear: Ear canal normal. No drainage.  No middle ear effusion. Tympanic membrane is not erythematous.  ?   Nose: Congestion present.  ?   Mouth/Throat:  ?   Mouth: Mucous membranes are moist. No oral lesions.  ?   Pharynx: Uvula midline. Posterior oropharyngeal erythema present. No oropharyngeal exudate.  ?   Tonsils: No tonsillar exudate or tonsillar abscesses. 2+ on the right. 2+ on the left.  ?Eyes:  ?   Conjunctiva/sclera: Conjunctivae normal.  ?Cardiovascular:  ?   Rate and Rhythm: Normal rate and regular rhythm.  ?   Heart sounds: Normal heart sounds.  ?Pulmonary:  ?   Effort: Pulmonary effort is normal. No respiratory distress.  ?   Breath sounds: Normal breath sounds. No stridor. No wheezing, rhonchi or rales.  ?Abdominal:  ?   General: Bowel sounds are normal.  ?   Tenderness: There is no abdominal tenderness.  ?Skin: ?   Findings: No rash.  ?Neurological:  ?   Mental Status: She is alert.  ? ? ?ASSESSMENT/PLAN:  ? ?Sore throat ?Will test for  strep and influenza  ?Given age and recent onset of cough, low suspicion for strep pharyngitis however patient presented with similar symptoms before when she was diagnosed with strep pharyngitis ?Recommended drinking warm drinks and sleeping with air humidifier to soothe nasal passageways at night  ?Recommended OTC medication to help with symptoms   ? ?Hemoptysis ?Suspect hemoptysis is due to mucosal irritation however will order CXR in the event this is a potential atypical PNA  ?  ? ? ?Ronnald Ramp, MD ?[facility]  ?

## 2021-07-27 NOTE — Patient Instructions (Signed)
?I recommend drinking warm teas with honey ?Using a Chloraseptic throat spray ?Sleeping with a humidifier to help with soothing your nasal passages of the night ? ?You are also okay to take any over-the-counter medications to help with your specific symptoms ? ?I will notify you of any abnormal results as we we will test for flu and strep pharyngitis today. ? ?Pharyngitis ?Pharyngitis is a sore throat (pharynx). This is when there is redness, pain, and swelling in your throat. Most of the time, this condition gets better on its own. In some cases, you may need medicine. ?What are the causes? ?An infection from a virus. ?An infection from bacteria. ?Allergies. ?What increases the risk? ?Being 36-79 years old. ?Being in crowded environments. These include: ?Daycares. ?Schools. ?Dormitories. ?Living in a place with cold temperatures outside. ?Having a weakened disease-fighting (immune) system. ?What are the signs or symptoms? ?Symptoms may vary depending on the cause. Common symptoms include: ?Sore throat. ?Tiredness (fatigue). ?Low-grade fever. ?Stuffy nose. ?Cough. ?Headache. ?Other symptoms may include: ?Glands in the neck (lymph nodes) that are swollen. ?Skin rashes. ?Film on the throat or tonsils. This can be caused by an infection from bacteria. ?Vomiting. ?Red, itchy eyes. ?Loss of appetite. ?Joint pain and muscle aches. ?Tonsils that are temporarily bigger than usual (enlarged). ?How is this treated? ?Many times, treatment is not needed. This condition usually gets better in 3-4 days without treatment. ?If the infection is caused by a bacteria, you may be need to take antibiotics. ?Follow these instructions at home: ?Medicines ?Take over-the-counter and prescription medicines only as told by your doctor. ?If you were prescribed an antibiotic medicine, take it as told by your doctor. Do not stop taking the antibiotic even if you start to feel better. ?Use throat lozenges or sprays to soothe your throat as told  by your doctor. ?Children can get pharyngitis. Do not give your child aspirin. ?Managing pain ?To help with pain, try: ?Sipping warm liquids, such as: ?Broth. ?Herbal tea. ?Warm water. ?Eating or drinking cold or frozen liquids, such as frozen ice pops. ?Rinsing your mouth (gargle) with a salt water mixture 3-4 times a day or as needed. ?To make salt water, dissolve ?-1 tsp (3-6 g) of salt in 1 cup (237 mL) of warm water. ?Do not swallow this mixture. ?Sucking on hard candy or throat lozenges. ?Putting a cool-mist humidifier in your bedroom at night to moisten the air. ?Sitting in the bathroom with the door closed for 5-10 minutes while you run hot water in the shower. ? ?General instructions ? ?Do not smoke or use any products that contain nicotine or tobacco. If you need help quitting, ask your doctor. ?Rest as told by your doctor. ?Drink enough fluid to keep your pee (urine) pale yellow. ?How is this prevented? ?Wash your hands often for at least 20 seconds with soap and water. If soap and water are not available, use hand sanitizer. ?Do not touch your eyes, nose, or mouth with unwashed hands. Wash hands after touching these areas. ?Do not share cups or eating utensils. ?Avoid close contact with people who are sick. ?Contact a doctor if: ?You have large, tender lumps in your neck. ?You have a rash. ?You cough up green, yellow-brown, or bloody spit. ?Get help right away if: ?You have a stiff neck. ?You drool or cannot swallow liquids. ?You cannot drink or take medicines without vomiting. ?You have very bad pain that does not go away with medicine. ?You have problems breathing, and it is not  from a stuffy nose. ?You have new pain and swelling in your knees, ankles, wrists, or elbows. ?These symptoms may be an emergency. Get help right away. Call your local emergency services (911 in the [place] ?Do not wait to see if the symptoms will go away. ?Do not drive yourself to the hospital. ?Summary ?Pharyngitis is a sore  throat (pharynx). This is when there is redness, pain, and swelling in your throat. ?Most of the time, pharyngitis gets better on its own. Sometimes, you may need medicine. ?If you were prescribed an antibiotic medicine, take it as told by your doctor. Do not stop taking the antibiotic even if you start to feel better. ?This information is not intended to replace advice given to you by your health care provider. Make sure you discuss any questions you have with your health care provider. ?Document Revised: 06/30/2020 Document Reviewed: 06/30/2020 ?Elsevier Patient Education ? [place]. ? ?

## 2021-07-29 LAB — INFLUENZA A AND B
Influenza A Ag, EIA: NEGATIVE
Influenza B Ag, EIA: NEGATIVE

## 2021-07-29 LAB — PLEASE NOTE:

## 2021-07-31 DIAGNOSIS — R042 Hemoptysis: Secondary | ICD-10-CM | POA: Insufficient documentation

## 2021-07-31 NOTE — Assessment & Plan Note (Signed)
Will test for strep and influenza  ?Given age and recent onset of cough, low suspicion for strep pharyngitis however patient presented with similar symptoms before when she was diagnosed with strep pharyngitis ?Recommended drinking warm drinks and sleeping with air humidifier to soothe nasal passageways at night  ?Recommended OTC medication to help with symptoms   ?

## 2021-07-31 NOTE — Assessment & Plan Note (Addendum)
Suspect hemoptysis is due to mucosal irritation however will order CXR in the event this is a potential atypical PNA  ?

## 2021-08-12 ENCOUNTER — Ambulatory Visit (INDEPENDENT_AMBULATORY_CARE_PROVIDER_SITE_OTHER): Payer: No Typology Code available for payment source | Admitting: Pharmacist

## 2021-08-12 ENCOUNTER — Encounter: Payer: Self-pay | Admitting: Pharmacist

## 2021-08-12 ENCOUNTER — Other Ambulatory Visit (HOSPITAL_COMMUNITY): Payer: Self-pay

## 2021-08-12 VITALS — BP 117/79 | HR 87 | Wt 305.8 lb

## 2021-08-12 DIAGNOSIS — I10 Essential (primary) hypertension: Secondary | ICD-10-CM

## 2021-08-12 DIAGNOSIS — E1165 Type 2 diabetes mellitus with hyperglycemia: Secondary | ICD-10-CM | POA: Diagnosis not present

## 2021-08-12 DIAGNOSIS — Z794 Long term (current) use of insulin: Secondary | ICD-10-CM | POA: Diagnosis not present

## 2021-08-12 MED ORDER — LOSARTAN POTASSIUM-HCTZ 50-12.5 MG PO TABS: 1.0000 | ORAL_TABLET | Freq: Every day | ORAL | 1 refills | Status: DC

## 2021-08-12 NOTE — Patient Instructions (Addendum)
Great to see you today.  ? ?Continue Mounjaro and Semglee - same doses.  ? ?Blood pressure medication - small changes.  ?STOP HCTZ you are currently taking.  ?START - Losartan/HCTZ combination.  ? ?Return to clinic next Friday morning for lab visit - Brief.  ?

## 2021-08-12 NOTE — Progress Notes (Signed)
? ? ?S:    ? ?Chief Complaint  ?Patient presents with  ? Medication Management  ?  Diabetes F/U  ? ?Holly [name] is a 41 y.o. female who presents for diabetes evaluation, education, and management.  Patient was referred and last seen by Primary Care Provider, Dr. Rosine Door, on 06/17/2021.  ? ?Today, patient arrives in good spirits and ambulating without assistance.  ? ?Current diabetes medications include: Semglee (insulin glargine) 40 units once daily, Mounjaro (tirzepitide) 2.5mg  once weekly and Metformin XR 500mg  BID ?Current hypertension medications include: amlodipine 10mg  daily and HCTZ 50mg  daily. ?Current hyperlipidemia medications include: atorvastatin 40mg  once daily ? ?Patient reports taking all medications as prescribed. Patient reports adherence with medications.  ? ?Do you feel that your medications are working for you? yes ?Have you been experiencing any side effects to the medications prescribed? no ?Do you have any problems obtaining medications due to transportation or finances? Yes - unablet to afford Jardiance, Mounjaro and Humalog ?Insurance coverage: Multi-plan ? ?Patient denies hypoglycemic events. ? ?Reported home fasting blood sugars: all less than 200mg /dl  ?Reported 2 hour post-meal/random blood sugars: consistently < 200mg /dl. ? ?Patient denies nocturia (nighttime urination).  ? ?Patient-reported exercise habits: limited - previously liked to exercise.  Continues to walk the dog for 15 minutes twice daily. ? ? ?O:  ?Physical Exam ?Constitutional:   ?   Appearance: She is obese.  ?Pulmonary:  ?   Effort: Pulmonary effort is normal.  ?Neurological:  ?   Mental Status: She is alert.  ?Psychiatric:     ?   Mood and Affect: Mood normal.     ?   Behavior: Behavior normal.     ?   Thought Content: Thought content normal.  ? ? ?Review of Systems  ?All other systems reviewed and are negative. ? ? ?Lab Results  ?Component Value Date  ? HGBA1C 13.6 (A) 06/17/2021  ? ?Vitals:  ? 08/12/21 0844  ?BP:  117/79  ?Pulse: 87  ?SpO2: 100%  ? ? ?Lipid Panel  ?   ?Component Value Date/Time  ? CHOL 151 05/20/2021 1101  ? TRIG 135 05/20/2021 1101  ? HDL 30 (L) 05/20/2021 1101  ? CHOLHDL 5.0 (H) 05/20/2021 1101  ? CHOLHDL 5.4 (H) 08/05/2015 1408  ? VLDL 26 08/05/2015 1408  ? LDLCALC 97 05/20/2021 1101  ? ? ?A/P: ?Diabetes longstanding and currently much improved following addition of Mounjaro at last visit. Patient is less symptomatic with hyperglycemic symptoms.  She is able to verbalize appropriate hypoglycemia management plan. Medication adherence appears good. Control is suboptimal due to lack of options to increase dose of Mounjaro, or add Jardiance or Humalog. ?-Continued GLP-1/GIP  Mounjaro (tirzepitide) at 2.5mg  once weekly dose.  Sample provided.   ?- I will continue to work to obtain coverage for  both Mounjaro and SGLT2-I Jardiance  (empagliflozin).  ?-Continued metformin XR 500mg  BID   ?-Counseled on s/sx of and management of hypoglycemia.  ?-Next A1c anticipated 6 weeks.  ? ?Hypertension longstanding and currently at goal blood pressure.  ?Blood pressure goal of <130/80 mmHg. Medication adherence good. Blood pressure regimen is suboptimal due to  use of HCTZ at 50mg  daily. ?-Continue Amlodipine 10mg  daily ?-Change HCTZ from 50mg  to Losartan 50mg /HCTZ 12.5mg  daily ?BMET follow-up in 1 week.  ? ?Written patient instructions provided. Patient verbalized understanding of treatment plan. Total time in face to face counseling 29 minutes.   ? ?Follow up pharmacist clinic visit in 6 weeks. PCP clinic visit in 6-8  weeks anticipated.  ?

## 2021-08-12 NOTE — Assessment & Plan Note (Signed)
Hypertension longstanding and currently at goal blood pressure.  ?Blood pressure goal of <130/80 mmHg. Medication adherence good. Blood pressure regimen is suboptimal due to  use of HCTZ at 50mg  daily. ?-Continue Amlodipine 10mg  daily ?-Change HCTZ from 50mg  to Losartan 50mg /HCTZ 12.5mg  daily ?

## 2021-08-12 NOTE — Assessment & Plan Note (Signed)
Diabetes longstanding and currently much improved following addition of Mounjaro at last visit. Patient is less symptomatic with hyperglycemic symptoms.  She is able to verbalize appropriate hypoglycemia management plan. Medication adherence appears good. Control is suboptimal due to lack of options to increase dose of Mounjaro, or add Jardiance or Humalog. ?-Continued GLP-1/GIP  Mounjaro (tirzepitide) at 2.5mg  once weekly dose.  Sample provided.   ?-I will continue to work to obtain coverage for  SGLT2-I Jardiance  (empagliflozin).  ?-Continued metformin XR 500mg  BID   ?-Counseled on s/sx of and management of hypoglycemia.  ?-Next A1c anticipated 6 weeks.  ? ?

## 2021-08-12 NOTE — Progress Notes (Signed)
Reviewed: I agree with Dr. Koval's documentation and management. 

## 2021-08-15 ENCOUNTER — Other Ambulatory Visit (HOSPITAL_COMMUNITY): Payer: Self-pay

## 2021-08-19 ENCOUNTER — Other Ambulatory Visit: Payer: No Typology Code available for payment source

## 2021-08-19 DIAGNOSIS — I10 Essential (primary) hypertension: Secondary | ICD-10-CM

## 2021-08-20 LAB — BASIC METABOLIC PANEL
BUN/Creatinine Ratio: 13 (ref 9–23)
BUN: 8 mg/dL (ref 6–24)
CO2: 24 mmol/L (ref 20–29)
Calcium: 9.5 mg/dL (ref 8.7–10.2)
Chloride: 97 mmol/L (ref 96–106)
Creatinine, Ser: 0.63 mg/dL (ref 0.57–1.00)
Glucose: 153 mg/dL — ABNORMAL HIGH (ref 70–99)
Potassium: 3.6 mmol/L (ref 3.5–5.2)
Sodium: 139 mmol/L (ref 134–144)
eGFR: 114 mL/min/{1.73_m2} (ref >59)

## 2021-08-22 ENCOUNTER — Telehealth: Payer: Self-pay | Admitting: Pharmacist

## 2021-08-22 NOTE — Telephone Encounter (Signed)
Attempted phone call follow-up of lab. ? ?Left HIPAA compliant message RE lab work that was normal.  ?No additional f/u planned.  ? ?Patient next visit 09/23/2021 ?

## 2021-08-22 NOTE — Telephone Encounter (Signed)
-----   Message from Moses Manners, MD sent at 08/22/2021  8:09 AM EDT ----- ? ?----- Message ----- ?From: Interface, [facility] Lab Results In ?Sent: 08/20/2021   8:17 AM EDT ?To: Moses Manners, MD ? ? ?

## 2021-09-20 ENCOUNTER — Encounter: Payer: Self-pay | Admitting: *Deleted

## 2021-09-23 ENCOUNTER — Ambulatory Visit (INDEPENDENT_AMBULATORY_CARE_PROVIDER_SITE_OTHER): Payer: No Typology Code available for payment source | Admitting: Pharmacist

## 2021-09-23 ENCOUNTER — Encounter: Payer: Self-pay | Admitting: Pharmacist

## 2021-09-23 DIAGNOSIS — E1165 Type 2 diabetes mellitus with hyperglycemia: Secondary | ICD-10-CM

## 2021-09-23 DIAGNOSIS — I10 Essential (primary) hypertension: Secondary | ICD-10-CM

## 2021-09-23 DIAGNOSIS — Z794 Long term (current) use of insulin: Secondary | ICD-10-CM | POA: Diagnosis not present

## 2021-09-23 NOTE — Assessment & Plan Note (Signed)
  Hypertension slightly higher than goal currently - systolic 140. Blood pressure goal of <130/80  mmHg. Medication adherence good.  - If remains elevated at PCP visit, suggest increase to losartan 100/12.5mg  daily.

## 2021-09-23 NOTE — Progress Notes (Signed)
Reveiwed: I agree with Dr. Koval's documentation and management. 

## 2021-09-23 NOTE — Assessment & Plan Note (Signed)
Diabetes currently improved control with use of Mounjaro and Semglee (insulin glargine) therapy. Patient is able to verbalize appropriate hypoglycemia management plan. Medication adherence appears excellent. Control remains suboptimal due to inability to afford SGLT2, GLP or prandial insulin.  She has benefited from the use of Mounjaro 2.5mg  once weekly as a sample as she has noticed decreased satiety. She continues to work hard on diet and exercise plans.  -Continued basal insulin semgliee (insulin glargine) at 25 units daily.   -Plan to support with GLP-1/GIP Darcel Bayley) therapy as a sample when possible.  We discussed how this was helping but she may benefit more from a higher dose.  I will ask Hortencia Pilar, CPhT to assess options for medication supply assistance with this patient including co-pay cards. -Continue to hold on starting SGLT2-I  Jardiance until she can afford OR we can provide alternate support.   -Continued metformin 1000mg  QAM and 500mg  QPM

## 2021-09-23 NOTE — Progress Notes (Signed)
S:     Chief Complaint  Patient presents with   Medication Management    Diabetes   Legend Holly [name] is a 41 y.o. female who presents for diabetes evaluation, education, and management. PMH is significant for hypertension, hyperlipidemia, obesity, and diabetes.  Patient was referred and last seen by Primary Care Provider, Dr. Nita Sells, on 06/17/2021. Patient was referred on 07/27/2021 by Dr. Alba Cory. . At last pharmacy visit on 08/07/2021 we reviewed multiple aspects of diabetes and adjusted her hypertension medications.    Today, patient arrives in good spirits and presents without any assistance.    Current diabetes medications include: Semglee (insulin glargine)  25 units daily AM, Mounjaro (tirzepatide) 2.5mg  weekly Current hypertension medications include: amlodipine 10mg  daily, Losartan-HCTZ 50/12.5mg  Current hyperlipidemia medications include: atorvastatin 40mg   Patient reports taking all medications as prescribed.  However has been unable to afford and is not taking Humalog mealtime insulin nor is she taking Jardiance (empagliflozin) due to cost.   Do you feel that your medications are working for you? yes Have you been experiencing any side effects to the medications prescribed? no Do you have any problems obtaining medications due to transportation or finances? Yes - prices of medications are prohibitive Insurance coverage: multi-plan which that patient desires to change in the future - possibly October of this year.   Patient denies hypoglycemic events.  Reported home fasting blood sugars: mostly 100s- low 200s.   Reported 2 hour post-meal/random blood sugars: similar to pre-prandial mid 100s to low 200s Majority of self monitored glucose readings are 150-200.   CGM has been helpful to identify what foods are raising blood sugars.  Patient denies nocturia (nighttime urination).  States she has been urinating less during the day and has noticed a decrease thirst  lately.   Patient-reported exercise habits: Walking the dog multiple times per day 15-30 minutes.    O:  Physical Exam Constitutional:      Appearance: Normal appearance. She is obese.  Cardiovascular:     Rate and Rhythm: Normal rate.  Pulmonary:     Effort: Pulmonary effort is normal.  Neurological:     Mental Status: She is alert.  Psychiatric:        Mood and Affect: Mood normal.        Behavior: Behavior normal.        Thought Content: Thought content normal.        Judgment: Judgment normal.     Review of Systems  All other systems reviewed and are negative.  CGM has been off for 1 week - she did not have reader  Lab Results  Component Value Date   HGBA1C 13.6 (A) 06/17/2021   Vitals:   09/23/21 0900  BP: 140/82  Pulse: 92  SpO2: 99%    Lipid Panel     Component Value Date/Time   CHOL 151 05/20/2021 1101   TRIG 135 05/20/2021 1101   HDL 30 (L) 05/20/2021 1101   CHOLHDL 5.0 (H) 05/20/2021 1101   CHOLHDL 5.4 (H) 08/05/2015 1408   VLDL 26 08/05/2015 1408   LDLCALC 97 05/20/2021 1101    Clinical Atherosclerotic Cardiovascular Disease (ASCVD): No  The 10-year ASCVD risk score (Arnett DK, et al., 2019) is: 17.9%   Values used to calculate the score:     Age: 1 years     Sex: Female     Is Non-Hispanic African American: Yes     Diabetic: Yes     Tobacco smoker: No  Systolic Blood Pressure: [id number] mmHg     Is BP treated: Yes     HDL Cholesterol: 30 mg/dL     Total Cholesterol: 151 mg/dL    A/P: Diabetes currently improved control with use of Mounjaro and Semglee (insulin glargine) therapy. Patient is able to verbalize appropriate hypoglycemia management plan. Medication adherence appears excellent. Control remains suboptimal due to inability to afford SGLT2, GLP or prandial insulin.  She has benefited from the use of Mounjaro 2.5mg  once weekly as a sample as she has noticed decreased satiety. She continues to work hard on diet and exercise plans.   -Continued basal insulin semgliee (insulin glargine) at 25 units daily.   -Plan to support with GLP-1/GIP Darcel Bayley) therapy as a sample when possible.  We discussed how this was helping but she may benefit more from a higher dose.  I will ask Holly [name], CPhT to assess options for medication supply assistance with this patient including co-pay cards. -Continue to hold on starting SGLT2-I  Jardiance until she can afford OR we can provide alternate support.   -Continued metformin 1000mg  QAM and 500mg  QPM -Extensively discussed pathophysiology of diabetes, recommended lifestyle interventions, dietary effects on blood sugar control.  -Counseled on s/sx of and management of hypoglycemia.  -Next A1c anticipated at upcoming PCP visit with Dr. Nita Sells.   Hypertension slightly higher than goal currently - systolic [id number]. Blood pressure goal of <130/80  mmHg. Medication adherence good.  - If remains elevated at PCP visit, suggest increase to losartan 100/12.5mg  daily.   Written patient instructions provided. Patient verbalized understanding of treatment plan. Total time in face to face counseling 28 minutes.    Follow up pharmacist PRN after next PCP clinic visit in 4-6 weeks.

## 2021-09-23 NOTE — Patient Instructions (Signed)
Great to see you today.    Your blood sugar is much improved.   Please continue to work on your diet and exercise plans we discussed today.   Next visit with Dr. Nita Sells in the next 4-6 weeks we will plan to check your A1C at that time.

## 2021-09-26 ENCOUNTER — Telehealth: Payer: Self-pay | Admitting: Pharmacist

## 2021-09-26 ENCOUNTER — Other Ambulatory Visit (HOSPITAL_COMMUNITY): Payer: Self-pay

## 2021-09-26 NOTE — Progress Notes (Signed)
Reviewed: I agree with Dr. Koval's documentation and management. 

## 2021-09-26 NOTE — Telephone Encounter (Signed)
Noted and agree. 

## 2021-09-26 NOTE — Telephone Encounter (Signed)
Contacted patient and communicated availability of Mounjaro sample for pick-up.   She stated she would pick-up shortly.   Medication Samples have been provided to the patient.  Drug name: Greggory Keen (tirzepatide)            Qty: 4 pens  LOT: [id number]  Exp.Date: 04/21/2023  Dosing instructions: inject weekly  The patient has been instructed regarding the correct time, dose, and frequency of taking this medication, including desired effects and most common side effects.   Holly [name] 2:10 PM 09/26/2021

## 2021-10-25 ENCOUNTER — Encounter: Payer: Self-pay | Admitting: Family Medicine

## 2021-10-25 ENCOUNTER — Ambulatory Visit (INDEPENDENT_AMBULATORY_CARE_PROVIDER_SITE_OTHER): Payer: No Typology Code available for payment source | Admitting: Student

## 2021-10-25 VITALS — BP 118/82 | HR 88 | Ht 66.0 in | Wt 313.0 lb

## 2021-10-25 DIAGNOSIS — E119 Type 2 diabetes mellitus without complications: Secondary | ICD-10-CM | POA: Diagnosis not present

## 2021-10-25 DIAGNOSIS — G5603 Carpal tunnel syndrome, bilateral upper limbs: Secondary | ICD-10-CM | POA: Diagnosis not present

## 2021-10-25 LAB — POCT GLYCOSYLATED HEMOGLOBIN (HGB A1C): HbA1c, POC (controlled diabetic range): 7.3 % — AB (ref 0.0–7.0)

## 2021-10-25 MED ORDER — DICLOFENAC SODIUM 1 % EX GEL: 2.0000 g | Freq: Four times a day (QID) | CUTANEOUS | 0 refills | Status: DC

## 2021-10-25 NOTE — Patient Instructions (Addendum)
It was great to see you today! Thank you for choosing [facility] for your primary care. Holly [name] was seen for hand numbness.  Today we addressed: Wear the wrist brace at nighttime and as often as possible during the day Ibuprofen 600mg  three times a day with food OR aleve 2 tabs twice a day with food for pain and inflammation. Follow up with me in 2-4 weeks if symptoms persist  We are trying to get your diabetes under control as well, you a doing a great job! Keep up the good work! Please let know if you need assistance with [facility] and insurance.   If you haven't already, sign up for My Chart to have easy access to your labs results, and communication with your primary care physician.  You should return to our clinic Return in about 4 weeks (around 11/22/2021), or if symptoms worsen or fail to improve.  I recommend that you always bring your medications to each appointment as this makes it easy to ensure you are on the correct medications and helps 01/22/2022 not miss refills when you need them.  Please arrive 15 minutes before your appointment to ensure smooth check in process.  We appreciate your efforts in making this happen.  Please call the clinic at [phone] if your symptoms worsen or you have any concerns.  Thank you for allowing me to participate in your care, [phone], MD PGY-2 Family Medicine

## 2021-10-25 NOTE — Progress Notes (Signed)
  SUBJECTIVE:   CHIEF COMPLAINT / HPI:   Hand numbness: She has had onset of numbness in all fingers on the right side and then downgraded the first three fingers onset a few days ago DM with medication regimen of Mounjaro and Semglee, Metformin  Started coming for this issue a couple of years ago, has bilateral carpal tunnel that is chronic More present during nighttime after sleeping on the right sided  Attempted splints, NSAIDs, Voltaren gel and failed conservative treatment  She went to sports medicine and had an NCV/EMG performed with neurology.   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  Sports medicine recommended continuing with orthopedic referral for hand surgery; she was seen by them and did not want to proceed with surgery at that time.   She has been trying to continue with conservative treatment modalities since then.  DM2: Patient reports good compliance with her medications. Started Greggory Keen recently and feels that it helps with her cravings for food.  PERTINENT  PMH / PSH:  Bilateral carpal tunnel syndrome, DM2 on insulin    OBJECTIVE:  BP 118/82   Pulse 88   Ht 5\' 6"  (1.676 m)   Wt (!) 313 lb (142 kg)   SpO2 96%   BMI 50.52 kg/m   General: NAD, pleasant, able to participate in exam; obese AA F Cardiac: RRR, no murmurs auscultated Abdomen: soft, obese Extremities: warm and well perfused, no edema or cyanosis, positive phalens on left, negative tinels  Skin: warm and dry, no rashes noted Neuro: alert, no obvious focal deficits, speech normal Psych: Normal affect and mood  ASSESSMENT/PLAN:  Diabetes mellitus without complication (HCC) Greatly improved today with A1c of 7.3, discussed during visit. Continue treatment regimen Repeat A1C in 6 months.   Carpal tunnel syndrome,  bilateral Continuing conservative treatment Voltaren gel ordered  PT ordered as it has helped in the past  Continue with cock up splint she has at home  Not a candidate for steroid injections due to DM2   Orders Placed This Encounter  Procedures   Ambulatory referral to Physical Therapy    Referral Priority:   Routine    Referral Type:   Physical Medicine    Referral Reason:   Specialty Services Required    Requested Specialty:   Physical Therapy    Number of Visits Requested:   1   HgB A1c   Meds ordered this encounter  Medications   diclofenac Sodium (VOLTAREN) 1 % GEL    Sig: Apply 2 g topically 4 (four) times daily.    Dispense:  150 g    Refill:  0   Return in about 4 weeks (around 11/22/2021), or if symptoms worsen or fail to improve.  01/22/2022, MD PGY-2 Family Medicine

## 2021-10-25 NOTE — Assessment & Plan Note (Signed)
Continuing conservative treatment Voltaren gel ordered  PT ordered as it has helped in the past  Continue with cock up splint she has at home  Not a candidate for steroid injections due to DM2

## 2021-10-25 NOTE — Assessment & Plan Note (Signed)
Greatly improved today with A1c of 7.3, discussed during visit. Continue treatment regimen Repeat A1C in 6 months.

## 2021-10-25 NOTE — Assessment & Plan Note (Signed)
>>  ASSESSMENT AND PLAN FOR DIABETES MELLITUS WITHOUT COMPLICATION (HCC) WRITTEN ON 10/25/2021  4:29 PM BY MAXWELL, ALLEE, MD  Greatly improved today with A1c of 7.3, discussed during visit. Continue treatment regimen Repeat A1C in 6 months.

## 2021-10-26 ENCOUNTER — Other Ambulatory Visit: Payer: Self-pay | Admitting: Family Medicine

## 2021-10-26 DIAGNOSIS — E1165 Type 2 diabetes mellitus with hyperglycemia: Secondary | ICD-10-CM

## 2021-10-26 DIAGNOSIS — H7291 Unspecified perforation of tympanic membrane, right ear: Secondary | ICD-10-CM

## 2021-10-27 ENCOUNTER — Other Ambulatory Visit: Payer: Self-pay | Admitting: Family Medicine

## 2021-10-27 DIAGNOSIS — E1165 Type 2 diabetes mellitus with hyperglycemia: Secondary | ICD-10-CM

## 2021-10-27 MED ORDER — METFORMIN HCL ER 500 MG PO TB24: 500.0000 mg | ORAL_TABLET | Freq: Every day | ORAL | 2 refills | Status: DC

## 2021-10-27 MED ORDER — INSULIN GLARGINE 100 UNIT/ML ~~LOC~~ SOLN
25.0000 [IU] | Freq: Every day | SUBCUTANEOUS | 5 refills | Status: DC
Start: 2021-10-27 — End: 2021-11-07

## 2021-10-28 ENCOUNTER — Other Ambulatory Visit: Payer: Self-pay | Admitting: Family Medicine

## 2021-10-28 ENCOUNTER — Other Ambulatory Visit (HOSPITAL_COMMUNITY): Payer: Self-pay

## 2021-10-28 DIAGNOSIS — I10 Essential (primary) hypertension: Secondary | ICD-10-CM

## 2021-10-31 ENCOUNTER — Other Ambulatory Visit: Payer: Self-pay | Admitting: Family Medicine

## 2021-10-31 ENCOUNTER — Other Ambulatory Visit (HOSPITAL_COMMUNITY): Payer: Self-pay

## 2021-11-05 ENCOUNTER — Other Ambulatory Visit: Payer: Self-pay | Admitting: Family Medicine

## 2021-11-07 ENCOUNTER — Encounter: Payer: Self-pay | Admitting: Pharmacist

## 2021-11-07 ENCOUNTER — Ambulatory Visit (INDEPENDENT_AMBULATORY_CARE_PROVIDER_SITE_OTHER): Payer: No Typology Code available for payment source | Admitting: Pharmacist

## 2021-11-07 DIAGNOSIS — E1165 Type 2 diabetes mellitus with hyperglycemia: Secondary | ICD-10-CM | POA: Diagnosis not present

## 2021-11-07 DIAGNOSIS — E119 Type 2 diabetes mellitus without complications: Secondary | ICD-10-CM | POA: Diagnosis not present

## 2021-11-07 DIAGNOSIS — Z794 Long term (current) use of insulin: Secondary | ICD-10-CM | POA: Diagnosis not present

## 2021-11-07 MED ORDER — MOUNJARO 2.5 MG/0.5ML ~~LOC~~ SOAJ: 2.5000 mg | SUBCUTANEOUS | 0 refills | Status: DC

## 2021-11-07 MED ORDER — HUMALOG KWIKPEN 200 UNIT/ML ~~LOC~~ SOPN
8.0000 [IU] | PEN_INJECTOR | Freq: Two times a day (BID) | SUBCUTANEOUS | 3 refills | Status: DC
Start: 2021-11-07 — End: 2022-10-10

## 2021-11-07 MED ORDER — INSULIN GLARGINE 100 UNIT/ML ~~LOC~~ SOLN: 25.0000 [IU] | Freq: Every day | SUBCUTANEOUS | 5 refills | Status: DC

## 2021-11-07 NOTE — Patient Instructions (Addendum)
Nice to see you today.   Please continue Mounjaro 2.5mg  once weekly.   Decrease Semglee to 25 units once daily in the morning.   Start Humalog 8 units prior to two large meals per day.   Follow-up with Dr. Melba Coon.

## 2021-11-07 NOTE — Progress Notes (Signed)
S:     Chief Complaint  Patient presents with   Medication Management    Diabetes    Holly [name] is a 41 y.o. female who presents for diabetes evaluation, education, and management.  PMH is significant for obesity and interest in weight loss.  Patient seen by Dr. Laury Deep on 10/25/2021. Patient was last seen by Primary Care Provider, Dr. Melba Coon, on 06/17/2021.  At last visit, we continued to work on combination of weight loss and diabetes control.   Today, patient arrives in good spirits and presents without any assistance.   Patient reports blood sugar control has been doing very well lately.    Current diabetes medications include: Insulin glargine (using semglee with coupon card for $0 copay) and has not taken Humalog or Byetta.  Took last dose of Mounjaro (tirzepatide) 0.25mg  over 1 week ago.   Patient reports adherence to taking all medications as prescribed.  Do you feel that your medications are working for you? yes Have you been experiencing any side effects to the medications prescribed? no Do you have any problems obtaining medications due to transportation or finances? Yes, however she now has [employer] in addition to her previous insurance.   Patient denies hypoglycemic events. Reported home fasting blood sugars: 100-low 200s   Patient reported dietary habits: Eats 2 meals/day  Patient-reported exercise habits: walking the dog - currently multiple times per day   O:   Review of Systems  All other systems reviewed and are negative.   Physical Exam Constitutional:      Appearance: She is obese.  Pulmonary:     Effort: Pulmonary effort is normal.  Neurological:     Mental Status: She is alert.  Psychiatric:        Mood and Affect: Mood normal.        Thought Content: Thought content normal.    Lab Results  Component Value Date   HGBA1C 7.3 (A) 10/25/2021   Lipid Panel     Component Value Date/Time   CHOL 151 05/20/2021 1101   TRIG 135  05/20/2021 1101   HDL 30 (L) 05/20/2021 1101   CHOLHDL 5.0 (H) 05/20/2021 1101   CHOLHDL 5.4 (H) 08/05/2015 1408   VLDL 26 08/05/2015 1408   LDLCALC 97 05/20/2021 1101    Clinical Atherosclerotic Cardiovascular Disease (ASCVD): No  The 10-year ASCVD risk score (Arnett DK, et al., 2019) is: 7.5%   Values used to calculate the score:     Age: 37 years     Sex: Female     Is Non-Hispanic African American: Yes     Diabetic: Yes     Tobacco smoker: No     Systolic Blood Pressure: 118 mmHg     Is BP treated: Yes     HDL Cholesterol: 30 mg/dL     Total Cholesterol: 151 mg/dL    A/P: Diabetes longstanding and currently improved blood sugar control with use of combination blood glucose control.  Patient is able to verbalize appropriate hypoglycemia management plan. Medication adherence appears fair. Control is suboptimal due to lack of basal bolus regimen and supply of Mounjaro (insurance coverage issues).  -Decreased dose of basal insulin Semglee (insulin glargine)from 40 to 25 units once daily in the morning -Started rapid insulin Humalog (insulin lispro) in pen form.  Instructed to use 8 units prior to two large meals daily.   -Continued GLP-1/GIP  Mounjaro (tirzepatide) at 2.5mg  once weekly.  Appears she has coverage for Trulicity.  At next PCP visit, consider the following: Switch from [facility] to Trulicity (dulaglutide) as this appears to be low tier coverage.  Alternatively, IF the insurance covers Mounjaro, titrate to next higher dose.  I will ask Jannette Spanner, CPhT to try and determine how this patient may be covered. - Not taking  SGLT2-I due to cost.  -Continued metformin XR  500mg  at same dose 1 AM and 2 PM.  -Patient educated on purpose, proper use, and potential adverse effects of alternate GLP agents. -Extensively discussed pathophysiology of diabetes, recommended lifestyle interventions, dietary effects on blood sugar control.  -Counseled on s/sx of and management of  hypoglycemia.    Written patient instructions provided. Patient verbalized understanding of treatment plan.  Total time in face to face counseling 27 minutes.    Follow-up:  Pharmacist PRN - perhaps alternating with PCP - defer schedule of f/u to PCP needs. PCP, Dr. clinic visit within the next month.

## 2021-11-07 NOTE — Progress Notes (Signed)
Reviewed: I agree with Dr. Koval's documentation and management. 

## 2021-11-07 NOTE — Assessment & Plan Note (Signed)
Diabetes longstanding and currently improved blood sugar control with use of combination blood glucose control.  Patient is able to verbalize appropriate hypoglycemia management plan. Medication adherence appears fair. Control is suboptimal due to lack of basal bolus regimen and supply of Mounjaro (insurance coverage issues).  -Decreased dose of basal insulin Semglee (insulin glargine)from 40 to 25 units once daily in the morning -Started rapid insulin Humalog (insulin lispro) in pen form.  Instructed to use 8 units prior to two large meals daily.   -Continued GLP-1/GIP  Mounjaro (tirzepatide) at 2.5mg  once weekly.  Appears she has coverage for Trulicity.  At next PCP visit, consider the following: Switch from [facility] to Trulicity (dulaglutide) as this appears to be low tier coverage.  Alternatively, IF the insurance covers Mounjaro, titrate to next higher dose.  I will ask Jannette Spanner, CPhT to try and determine how this patient may be covered. -Not taking SGLT2-I due to cost.

## 2021-11-21 NOTE — Patient Instructions (Incomplete)
It was wonderful to see you today.  Please bring ALL of your medications with you to every visit.   Today we talked about:  You are overdue for an eye appointment. A referral was sent in February. Contact information is listed below: [facility] -[address] [place], [place] [address] Ophthalmology [phone]  Thank you for choosing [facility].   Please call [phone] with any questions about today's appointment.  Please be sure to schedule follow up at the front  desk before you leave today.   Sabino Dick, DO PGY-3 Family Medicine

## 2021-11-21 NOTE — Progress Notes (Signed)
    SUBJECTIVE:   CHIEF COMPLAINT / HPI:   Diabetes, Type 2 Last seen by Dr. Raymondo Band on 7/24. Lantus was decreased from 40U to 25U at that time. Was suggested to switch from [place] to [employer] for insurance reasons.  - Last A1c was 7.3 on 10/25/21 - Medications: Mounjaro 2.5 mg weekly, Metformin 500 mg BID, Lantus 25U daily, 8U humalog BID with two biggest meals - Compliance: *** - Checking BG at home: *** - Diet: *** - Exercise: *** - Eye exam: Due - Foot exam: Due - Microalbumin: *** - Statin: Atorvastatin 40 mg  - Denies symptoms of hypoglycemia, polyuria, polydipsia, numbness extremities, foot ulcers/trauma  Chronic HTN Disease Monitoring:  Home BP Monitoring - *** Chest pain- {YES/NO/WILD OZHYQ:65784}    Dyspnea- {YES/NO/WILD ONGEX:52841} Headache - {YES/NO/WILD LKGMW:10272}  Medications: Amlodipine 10 mg, Losartan-HCTZ combo 10mg -12.5 mg  Compliance-  yes      Lightheadedness-  {YES/NO/WILD              Edema- {YES/NO/WILD ZDGUY:40347}   Preventitive Healthcare:  Exercise: {YES/NO/WILD QQVZD:63875}   Diet Pattern: ***  Salt Restriction: ***   PERTINENT  PMH / PSH:  Obesity, HLD, HTN, carpal tunnel  OBJECTIVE:   There were no vitals taken for this visit. ***  General: NAD, pleasant, able to participate in exam Cardiac: RRR, no murmurs. Respiratory: CTAB, normal effort, No wheezes, rales or rhonchi Foot exam: No deformities, ulcerations, or other skin breakdown on feet bilaterally.  Sensation intact to monofilament and light touch.  PT and DP pulses intact BL.  *** Skin: warm and dry, no rashes noted Psych: Normal affect and mood  ASSESSMENT/PLAN:   No problem-specific Assessment & Plan notes found for this encounter.     [id number]}, DO [facility]

## 2021-11-22 ENCOUNTER — Ambulatory Visit (INDEPENDENT_AMBULATORY_CARE_PROVIDER_SITE_OTHER): Payer: No Typology Code available for payment source | Admitting: Family Medicine

## 2021-11-22 ENCOUNTER — Encounter: Payer: Self-pay | Admitting: Family Medicine

## 2021-11-22 VITALS — BP 132/80 | HR 94 | Ht 66.0 in | Wt 312.2 lb

## 2021-11-22 DIAGNOSIS — I10 Essential (primary) hypertension: Secondary | ICD-10-CM

## 2021-11-22 DIAGNOSIS — Z794 Long term (current) use of insulin: Secondary | ICD-10-CM | POA: Diagnosis not present

## 2021-11-22 DIAGNOSIS — E1165 Type 2 diabetes mellitus with hyperglycemia: Secondary | ICD-10-CM

## 2021-11-22 MED ORDER — TRULICITY 0.75 MG/0.5ML ~~LOC~~ SOAJ: 0.7500 mg | SUBCUTANEOUS | 2 refills | Status: DC

## 2021-11-22 MED ORDER — DAPAGLIFLOZIN PROPANEDIOL 5 MG PO TABS: 5.0000 mg | ORAL_TABLET | Freq: Every day | ORAL | 1 refills | Status: DC

## 2021-11-22 NOTE — Assessment & Plan Note (Signed)
Well controlled, near goal of <130/80.  -Continue current regimen -Monitor at home and log numbers

## 2021-11-22 NOTE — Assessment & Plan Note (Addendum)
Stable on current regimen. Congratulated patient on her significant improvement. Filled out FMLA paperwork for her as she previously required several follow ups for adjustment to her diabetes regimen and had to miss work as a result. She is now more stable and can follow up once every 3 months.  -Continue metformin, Lantus and Humalog dosing -START farxiga (previous BMP with normal renal function). Counseled to hold for sick days. -Repeat BMP in 1 week -CHANGE Mounjaro to Trulicity as this is preferred by her insurance -Follow up in October for repeat A1c -Provided with information to schedule eye examination

## 2021-11-30 ENCOUNTER — Other Ambulatory Visit: Payer: Self-pay | Admitting: Family Medicine

## 2021-11-30 DIAGNOSIS — E1165 Type 2 diabetes mellitus with hyperglycemia: Secondary | ICD-10-CM

## 2021-12-05 ENCOUNTER — Telehealth: Payer: Self-pay | Admitting: Family Medicine

## 2021-12-05 NOTE — Telephone Encounter (Signed)
Holly [name] with Holly [name] called wanting me to put information in patients chart stating, they have been trying to contact her, but have got no answer. She is wanting me to leave her contact information it is for case management, if she needs help with PT, home health, etc, or any questions or concerns.  Holly [name]- [employer] case management.   Phone: [phone]  Ext: [phone]  Thanks!

## 2021-12-22 ENCOUNTER — Ambulatory Visit (INDEPENDENT_AMBULATORY_CARE_PROVIDER_SITE_OTHER): Payer: No Typology Code available for payment source | Admitting: Family Medicine

## 2021-12-22 ENCOUNTER — Encounter: Payer: Self-pay | Admitting: Family Medicine

## 2021-12-22 DIAGNOSIS — M25521 Pain in right elbow: Secondary | ICD-10-CM

## 2021-12-22 DIAGNOSIS — G5603 Carpal tunnel syndrome, bilateral upper limbs: Secondary | ICD-10-CM

## 2021-12-22 NOTE — Assessment & Plan Note (Signed)
Less than one day duration.  Clearly MSK.  Two leading possibilities are referred from carpal tunnel and lateral epicondylitis.  Observe for now.  Ibuprofen OK.  Slow down on PT ball squeezing since it was the likely inciting event.  Refer to sports med if pain continues.

## 2021-12-22 NOTE — Progress Notes (Signed)
    SUBJECTIVE:   CHIEF COMPLAINT / HPI:   Rt arm pain.  Patient has known carpal tunnel syndrome bilaterally.  Getting some PT.  Yesterday, squeezed the ball a lot (one of PT exercises.)  Today awoke with right arm pain at elbow radiating to hand.  No trauma.  No known neck abnormality.  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  Pain is improving.    OBJECTIVE:   BP 136/82   Pulse 94   Ht 5\' 6"  (1.676 m)   Wt (!) 313 lb (142 kg)   SpO2 97%   BMI 50.52 kg/m   Normal strength, sensation and reflexes of rt upper extremity.  Mild tenderness over right lateral epicondyle.  ASSESSMENT/PLAN:   Carpal tunnel syndrome, bilateral Could have caused today's elbow pain.  Already getting treated.  I did not see a TSH.  For completeness, will check.  Right elbow pain Less than one day duration.  Clearly MSK.  Two leading possibilities are referred from carpal tunnel and lateral epicondylitis.  Observe for now.  Ibuprofen OK.  Slow down on PT ball squeezing since it was the likely inciting event.  Refer to sports med if pain continues.     , MD [facility]

## 2021-12-22 NOTE — Assessment & Plan Note (Signed)
Could have caused today's elbow pain.  Already getting treated.  I did not see a TSH.  For completeness, will check.

## 2021-12-22 NOTE — Patient Instructions (Signed)
I am not sure. Maybe you overdid it with the ball yesterday. Maybe you now have some tennis elbow (lateral epicondylitis) OK to take ibuprofen. If the problem persists, call me and I will put in a referral to sports medicine.   I will call with the thyroid blood test result or send a MyChart message.

## 2021-12-23 LAB — TSH: TSH: 1.85 u[IU]/mL (ref 0.450–4.500)

## 2022-01-03 ENCOUNTER — Other Ambulatory Visit: Payer: Self-pay | Admitting: Family Medicine

## 2022-01-03 ENCOUNTER — Encounter: Payer: Self-pay | Admitting: Family Medicine

## 2022-01-03 DIAGNOSIS — G5603 Carpal tunnel syndrome, bilateral upper limbs: Secondary | ICD-10-CM

## 2022-01-09 ENCOUNTER — Other Ambulatory Visit: Payer: Self-pay | Admitting: Family Medicine

## 2022-01-11 ENCOUNTER — Ambulatory Visit (INDEPENDENT_AMBULATORY_CARE_PROVIDER_SITE_OTHER): Payer: No Typology Code available for payment source | Admitting: Family Medicine

## 2022-01-11 VITALS — BP 138/88 | Ht 66.0 in | Wt 310.0 lb

## 2022-01-11 DIAGNOSIS — G5603 Carpal tunnel syndrome, bilateral upper limbs: Secondary | ICD-10-CM

## 2022-01-11 NOTE — Patient Instructions (Signed)
Unfortunately I don't think this is going to get better without surgery. We will refer you back to Dr. Amedeo Plenty. Continue using the braces at night in the meantime, the voltaren gel.

## 2022-01-12 ENCOUNTER — Encounter: Payer: Self-pay | Admitting: Family Medicine

## 2022-01-23 ENCOUNTER — Encounter: Payer: Self-pay | Admitting: Family Medicine

## 2022-01-25 ENCOUNTER — Other Ambulatory Visit: Payer: Self-pay | Admitting: Student

## 2022-01-25 DIAGNOSIS — G5603 Carpal tunnel syndrome, bilateral upper limbs: Secondary | ICD-10-CM

## 2022-02-06 ENCOUNTER — Other Ambulatory Visit: Payer: Self-pay | Admitting: Family Medicine

## 2022-02-06 DIAGNOSIS — E1165 Type 2 diabetes mellitus with hyperglycemia: Secondary | ICD-10-CM

## 2022-02-08 ENCOUNTER — Ambulatory Visit (INDEPENDENT_AMBULATORY_CARE_PROVIDER_SITE_OTHER): Payer: No Typology Code available for payment source | Admitting: Family Medicine

## 2022-02-08 VITALS — BP 128/70 | HR 95 | Temp 99.2°F | Ht 65.5 in | Wt 320.8 lb

## 2022-02-08 DIAGNOSIS — J208 Acute bronchitis due to other specified organisms: Secondary | ICD-10-CM

## 2022-02-08 NOTE — Patient Instructions (Addendum)
You have a viral acute bronchitis that is worsened by your already having asthma.  It will resolve on its own over time.  Symptoms typically last 7-10 days but can stretch out to 2-3 weeks. You can use Afrin for up to 5 days may help with congestion, mucinex or robitussin DM for cough., and sudafed if you don't have high blood pressure.  Unfortunately, antibiotics are not helpful for viral infections. Drink plenty of fluids and stay hydrated! Wash your hands frequently. Call if you are not improving by 7-10 days.Acute Bronchitis, Adult  Acute bronchitis is when air tubes in the lungs (bronchi) suddenly get swollen. The condition can make it hard for you to breathe. In adults, acute bronchitis usually goes away within 2 weeks. A cough caused by bronchitis may last up to 3 weeks. Smoking, allergies, and asthma can make the condition worse. What are the causes? Germs that cause cold and flu (viruses). The most common cause of this condition is the virus that causes the common cold. Bacteria. Substances that bother (irritate) the lungs, including: Smoke from cigarettes and other types of tobacco. Dust and pollen. Fumes from chemicals, gases, or burned fuel. Indoor or outdoor air pollution. What increases the risk? A weak body's defense system. This is also called the immune system. Any condition that affects your lungs and breathing, such as asthma. What are the signs or symptoms? A cough. Coughing up clear, yellow, or green mucus. Making high-pitched whistling sounds when you breathe, most often when you breathe out (wheezing). Runny or stuffy nose. Having too much mucus in your lungs (chest congestion). Shortness of breath. Body aches. A sore throat. How is this treated? Acute bronchitis may go away over time without treatment. Your doctor may tell you to: Drink more fluids. This will help thin your mucus so it is easier to cough up. Use a device that gets medicine into your lungs  (inhaler). Use a vaporizer or a humidifier. These are machines that add water to the air. This helps with coughing and poor breathing. Take a medicine that thins mucus and helps clear it from your lungs. Take a medicine that prevents or stops coughing. It is not common to take an antibiotic medicine for this condition. Follow these instructions at home:  Take over-the-counter and prescription medicines only as told by your doctor. Use an inhaler, vaporizer, or humidifier as told by your doctor. Take two teaspoons (10 mL) of honey at bedtime. This helps lessen your coughing at night. Drink enough fluid to keep your pee (urine) pale yellow. Do not smoke or use any products that contain nicotine or tobacco. If you need help quitting, ask your doctor. Get a lot of rest. Return to your normal activities when your doctor says that it is safe. Keep all follow-up visits. How is this prevented?  Wash your hands often with soap and water for at least 20 seconds. If you cannot use soap and water, use hand sanitizer. Avoid contact with people who have cold symptoms. Try not to touch your mouth, nose, or eyes with your hands. Avoid breathing in smoke or chemical fumes. Make sure to get the flu shot every year. Contact a doctor if: Your symptoms do not get better in 2 weeks. You have trouble coughing up the mucus. Your cough keeps you awake at night. You have a fever. Get help right away if: You cough up blood. You have chest pain. You have very bad shortness of breath. You faint  or keep feeling like you are going to faint. You have a very bad headache. Your fever or chills get worse. These symptoms may be an emergency. Get help right away. Call your local emergency services (911 in the [place] Do not wait to see if the symptoms will go away. Do not drive yourself to the hospital. Summary Acute bronchitis is when air tubes in the lungs (bronchi) suddenly get swollen. In adults, acute  bronchitis usually goes away within 2 weeks. Drink more fluids. This will help thin your mucus so it is easier to cough up. Take over-the-counter and prescription medicines only as told by your doctor. Contact a doctor if your symptoms do not improve after 2 weeks of treatment. This information is not intended to replace advice given to you by your health care provider. Make sure you discuss any questions you have with your health care provider. Document Revised: 08/04/2020 Document Reviewed: 08/04/2020 Elsevier Patient Education  2023 [employer].

## 2022-02-08 NOTE — Progress Notes (Signed)
[facility] visit yesterday Respiratory Patient presents with: cold and cough  Chronicity: New Onset: 4 days Frequency: Constant Cough characteristics: Productive of sputum Treatments tried: OTC medications (acetaminophen) Associated symptoms: headaches, fatigue, myalgias, shortness of breath (with exertion, hx of exercise induced asthma) and cough  Associated symptoms: no decreased appetite, no chest pain, no chest tightness, no ear pain, no fever, no malaise, no congestion, no postnasal drip, no nocturnal dyspnea, no sore throat, no sweats, no unplanned weight loss, no sinus pain, no rash, no urticaria, no abdominal pain, no itching, no swelling, no drooling, no trouble swallowing, no vomiting, no syncope, no sneezing, no swollen glands, no rhinorrhea and no wheezing  Risk Factors: known exposure to illness or sick contacts  Recent travel: None History of asthma: Yes (albuterol inhaler during illness or exercise)  History of COPD: No    Review of Systems  Constitutional: Positive for fatigue. Negative for chills, decreased appetite and fever.  HENT: Negative for congestion, drooling, ear pain, postnasal drip, rhinorrhea, sinus pain, sneezing, sore throat and trouble swallowing.  Respiratory: Positive for cough and shortness of breath (with exertion, hx of exercise induced asthma). Negative for chest tightness and wheezing.  Cardiovascular: Negative for chest pain and syncope.  Gastrointestinal: Positive for nausea. Negative for abdominal pain and vomiting.  Musculoskeletal: Positive for back pain and myalgias.  Skin: Negative for itching and rash.  Neurological: Positive for headaches. Negative for dizziness.     CoVid19 Risk factors  COVID Vaccination Status: 4 vaccinations , ? Bivalent vaccination: Yes 03/28/21  *SORE THROAT   Onset: Monday     Symptoms  *Fever: no    *Cough/URI sxs: yes *shortness of breath : yes, only with prolonged talking at her job *Rhinorrhea:  no *Nasal Congestion: yes *Myalgias: yes Headache: yes Rash: no Swollen neck glands: no    Recent Strep Exposure: no (+) fatigue Course: stable  Better with: Albuterol inh  Meds tried: APAP Sick contacts: son with "snotty nose" and head cold  Cough:  yes, productive yellow sputum      Sneezing: yes    Red Flags  Weight Loss:  no Immunocompromised:  no  Rash: no  Swallowing difficulty: no    CoVid19 Risk factors Negative Covid ag rapid test yesterday [facility]   Physical Exam VS: reviewed GEN: alert and fatigued HEENT: Head: Normocephalic, without obvious abnormality Eye: conjunctivae/corneas clear. PERRL, EOM's intact. Fundi benign., (+) left mdeial perilimbal subconjunctival hemorrhage ~1-2 mm wide and 4 mm length Ears: Normal TM's bilaterally. Normal auditory canals and external ears. Non-tender. Nose: Normal external nose, mucus membranes and septum. Pharynx: Normal. Neck / Thyroid: supple without significant adenopathy COR: Heart regular rate and rhythm LUNG: clear to auscultation bilaterally   A/  Acute Bronchitis, viral No clear exacerbation of asthma P/ Supportive care May be out of work today 1thru 02/12/22.  Rest.

## 2022-03-04 ENCOUNTER — Other Ambulatory Visit: Payer: Self-pay | Admitting: Family Medicine

## 2022-03-04 DIAGNOSIS — I10 Essential (primary) hypertension: Secondary | ICD-10-CM

## 2022-03-05 IMAGING — CR DG CHEST 2V
2 series · 2 of 2 positions shown · non-contrast
Comparison: 05/13/2018

CLINICAL DATA: Cough and shortness of breath for 1 week

EXAM:
CHEST - 2 VIEW

[w chest pa]
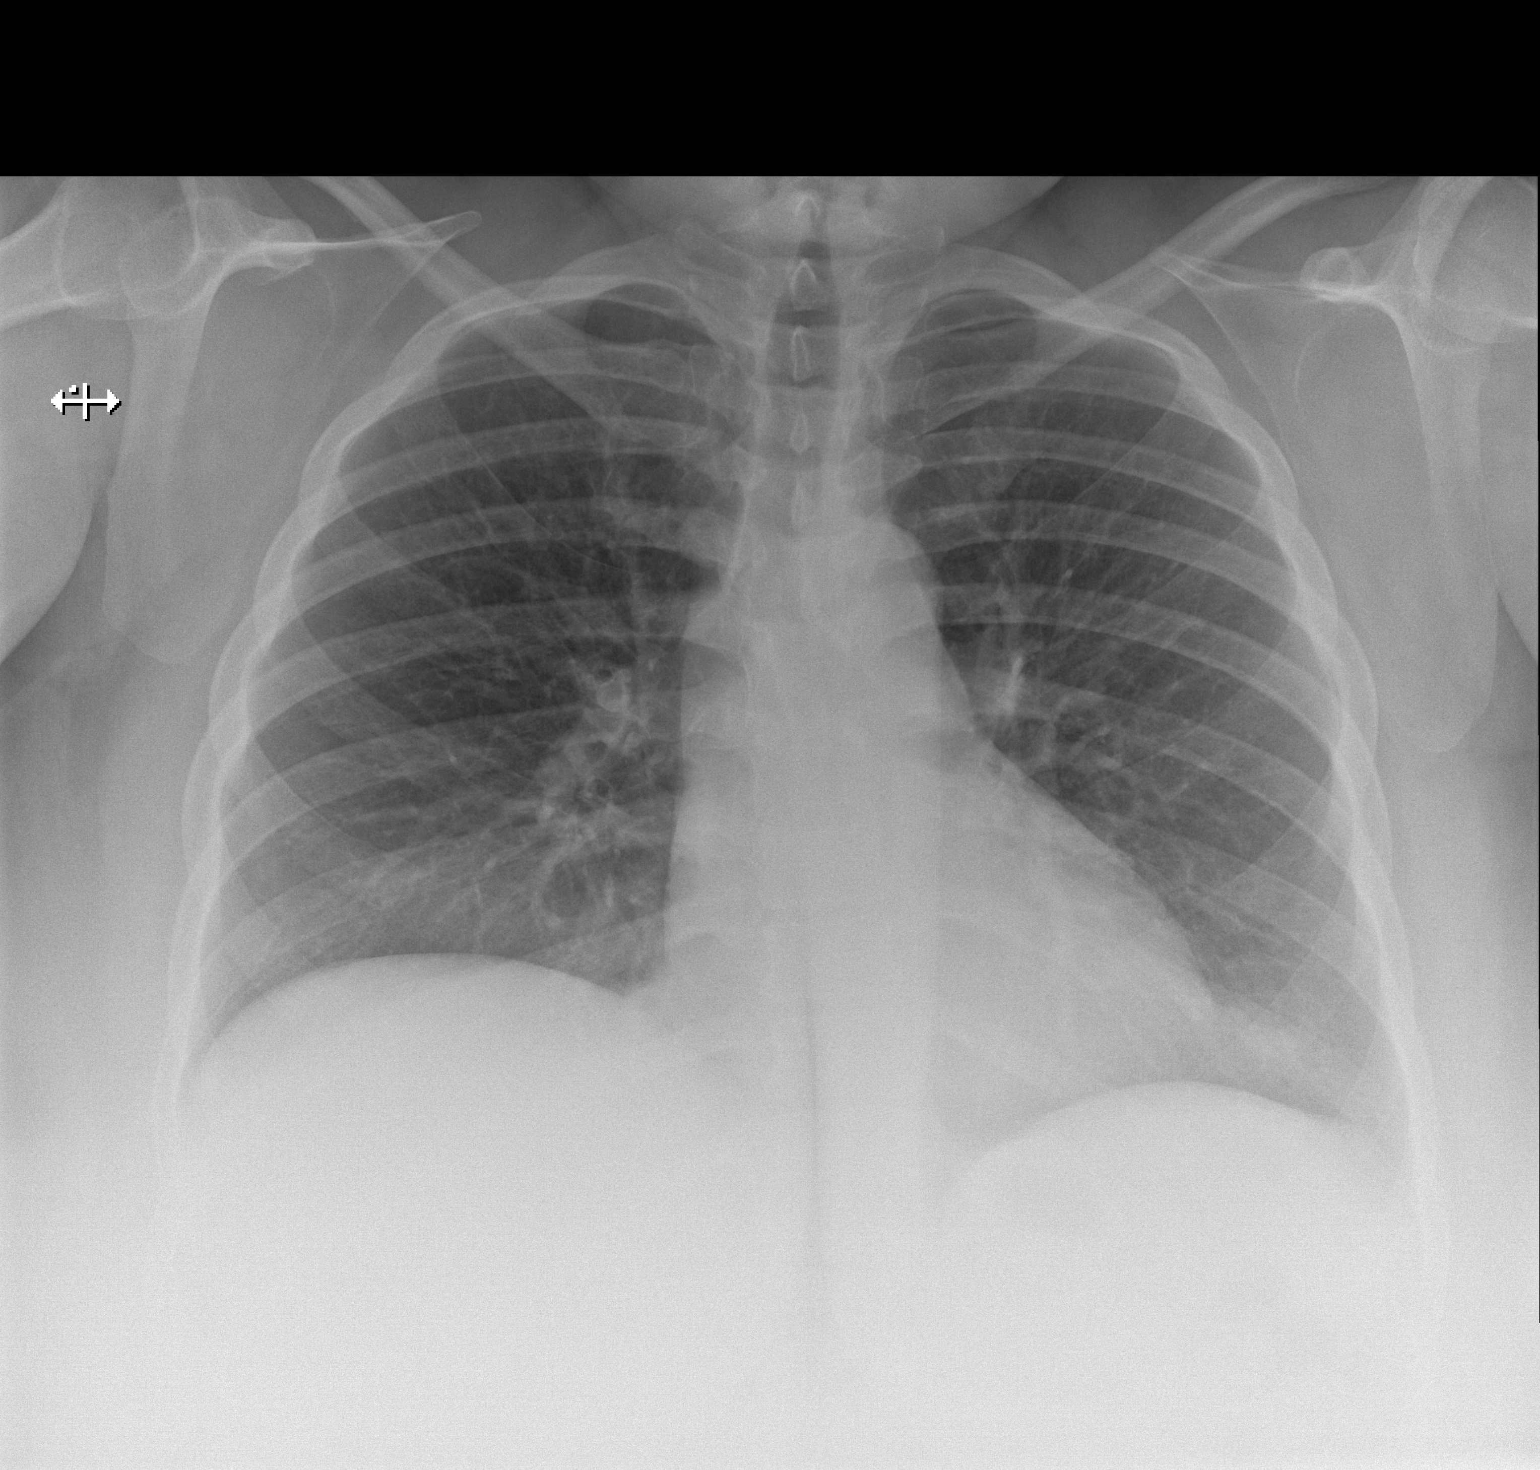

[w chest lat]
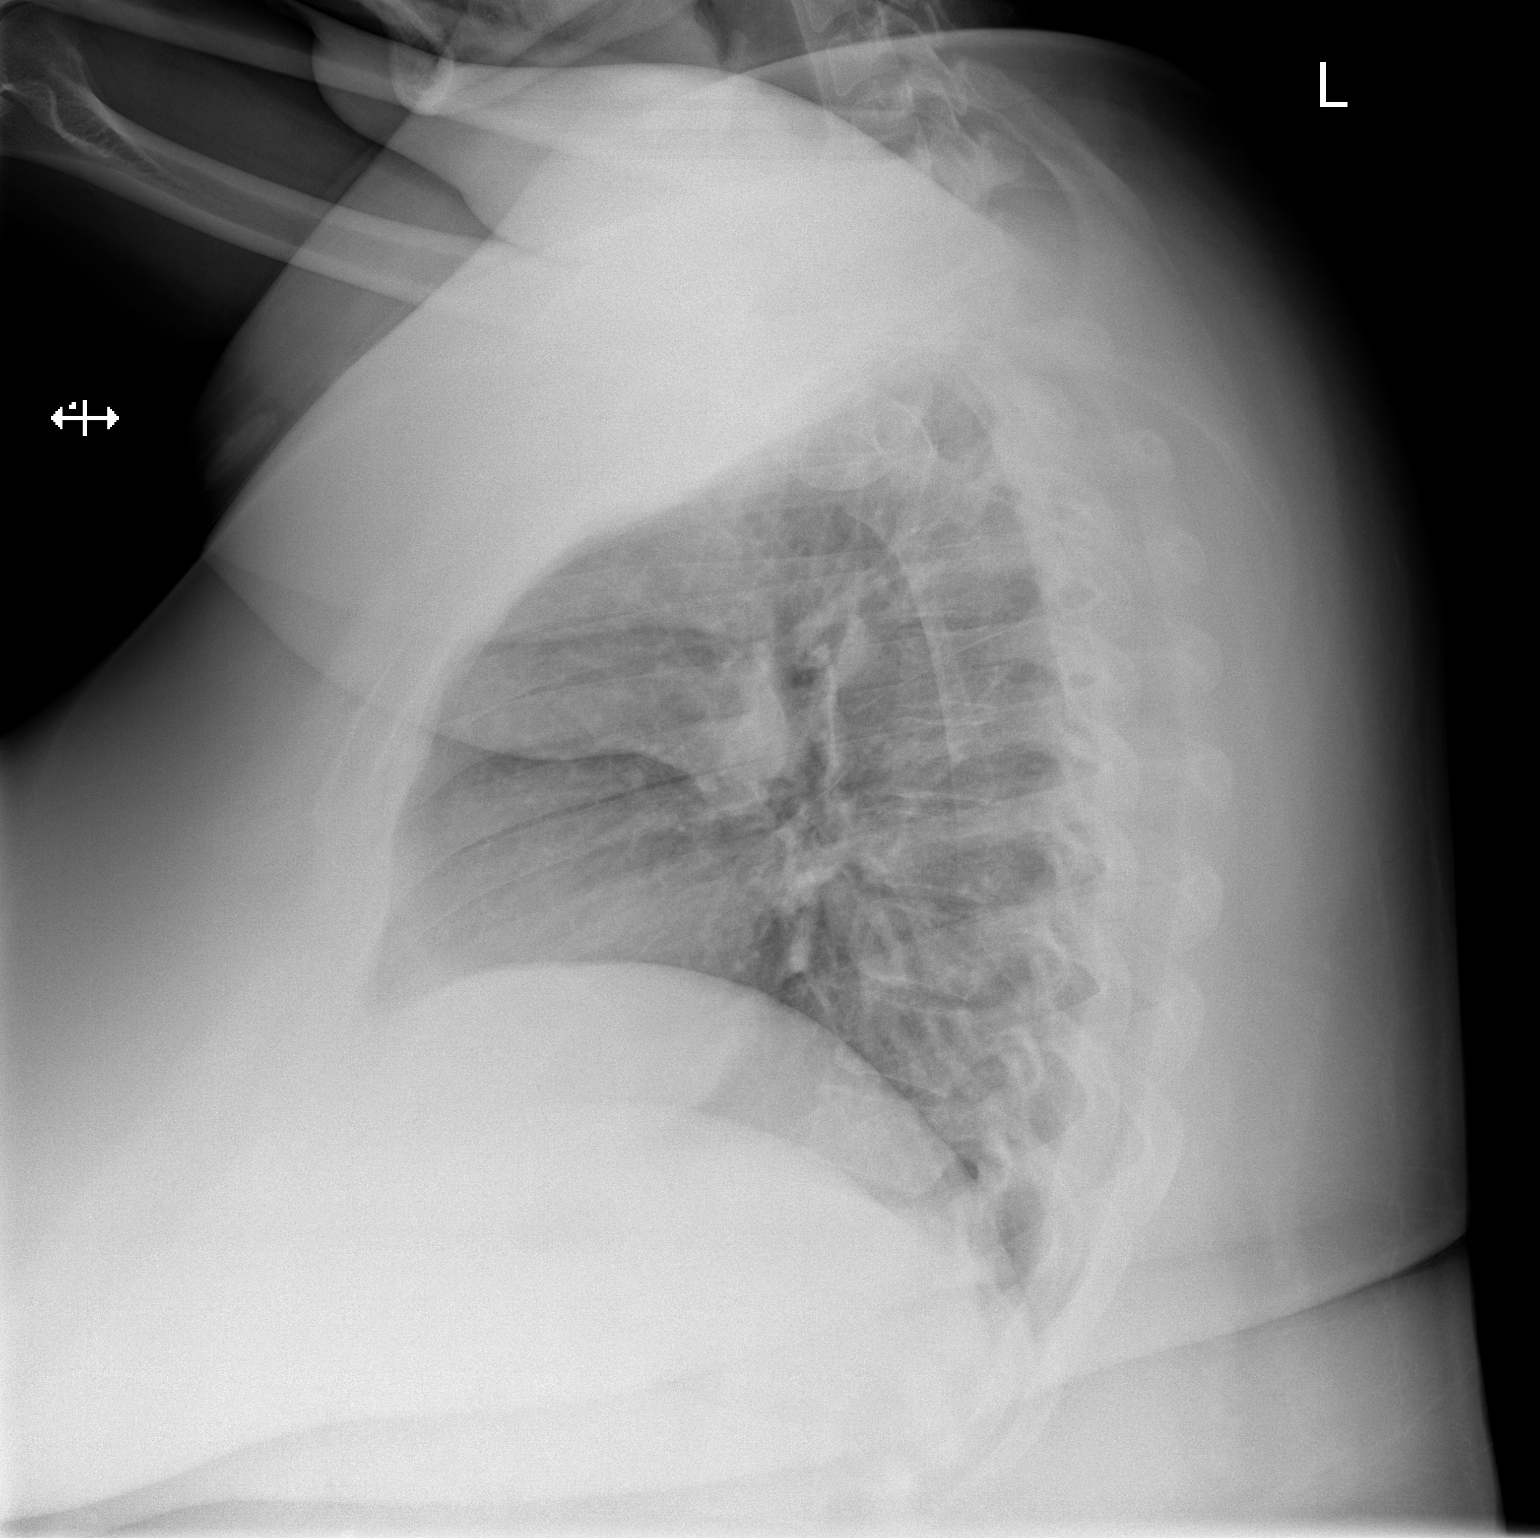

[2 of 2 positions shown; findings below may reference images not displayed]

FINDINGS: The heart size and mediastinal contours are within normal limits.
Both lungs are clear. The visualized skeletal structures are
unremarkable.
IMPRESSION: No active cardiopulmonary disease.

## 2022-04-06 ENCOUNTER — Ambulatory Visit (INDEPENDENT_AMBULATORY_CARE_PROVIDER_SITE_OTHER): Payer: No Typology Code available for payment source | Admitting: Student

## 2022-04-06 VITALS — BP 122/78 | HR 96 | Temp 98.2°F | Ht 65.5 in | Wt 318.0 lb

## 2022-04-06 DIAGNOSIS — J029 Acute pharyngitis, unspecified: Secondary | ICD-10-CM | POA: Diagnosis not present

## 2022-04-06 LAB — POCT RAPID STREP A (OFFICE): Rapid Strep A Screen: NEGATIVE

## 2022-04-06 NOTE — Progress Notes (Signed)
    SUBJECTIVE:   CHIEF COMPLAINT / HPI:   Patient is a 41 year old female presenting today for illness. Per patient symptom started with Sore throat  3 days ago.  Reports no fever  but other associated symptoms include cough, congested and bodache which started a day ago. She has had decreased appetite but drinking adequately.  No known recent sick contact. No ear pain, vomiting or diarrhea but endorses nausea. Patient has  received Flu and COVID vaccine.   PERTINENT  PMH / PSH: Reviewed  OBJECTIVE:   BP 122/78   Pulse 96   Temp 98.2 F (36.8 C) (Oral)   Ht 5' 5.5" (1.664 m)   Wt (!) 318 lb (144.2 kg)   SpO2 98%   BMI 52.11 kg/m    Physical Exam General: Alert, well appearing, NAD HEENT: Oropharyngeal erythema, questionable tonsillar exudate, white exudate on tongue as well.  No cervical lymphadenopathy Cardiovascular: RRR, No Murmurs, Normal S2/S2 Respiratory: CTAB, No wheezing or Rales Abdomen: No distension or tenderness  ASSESSMENT/PLAN:   Viral illness 41 year old presents with fever, cough, congestion, rhinorrhea and sore throat. She is afebrile today and on exam has oropharyngeal erythema,  good work of breathing on RA and clear breath sounds bilaterally. Rapid Strep test was negative. Overall presentation and exam is consistent with viral URI.   -Discussed conservative management with warm water, honey and lemon. -Recommended tylenol or ibuprofen for fever.  -Encouraged adequate hydration for patient.  -Outline signs and symptoms that will warrant ED visit or return for further assessment.     Jerre Simon, MD [facility]

## 2022-04-06 NOTE — Patient Instructions (Addendum)
It was wonderful to meet you today. Thank you for allowing me to be a part of your care. Below is a short summary of what we discussed at your visit today:  Your symptoms are likely due to upper respiratory viral illness.  Your strep test was negative.  I recommend warm water, honey and lemon.  Please ensure you are adequately hydrated.  Please bring all of your medications to every appointment!  If you have any questions or concerns, please do not hesitate to contact us via phone or MyChart message.   Jerre Simon, MD [facility]

## 2022-04-07 ENCOUNTER — Other Ambulatory Visit: Payer: Self-pay | Admitting: Family Medicine

## 2022-04-07 DIAGNOSIS — I10 Essential (primary) hypertension: Secondary | ICD-10-CM

## 2022-04-08 ENCOUNTER — Other Ambulatory Visit: Payer: Self-pay | Admitting: Family Medicine

## 2022-04-24 ENCOUNTER — Encounter: Payer: Self-pay | Admitting: Family Medicine

## 2022-05-09 ENCOUNTER — Other Ambulatory Visit: Payer: Self-pay | Admitting: Family Medicine

## 2022-05-09 DIAGNOSIS — I10 Essential (primary) hypertension: Secondary | ICD-10-CM

## 2022-05-12 ENCOUNTER — Ambulatory Visit: Payer: No Typology Code available for payment source | Admitting: Family Medicine

## 2022-05-12 NOTE — Progress Notes (Deleted)
    SUBJECTIVE:   CHIEF COMPLAINT / HPI:   BRITISH MOYD is a 42 y.o. female who presents to the [facility] clinic today to discuss the following concerns:   Discuss FMLA for Carpal Tunnel Mrs. Ruggirello has longstanding carpal tunnel.  She has been seen in our clinic as well as referred to sports medicine.  She was last seen by sports medicine clinic on 9/27. She has tried NSAIDs, Tylenol, voltaren gel with minimum relief. She has worn b/l wrist braces wtihout relief. She has had a NCV/EMGs which showed moderate carpal tunnel with evidence of denervation.  She was advised to see her orthopedist, Dr. Amedeo Plenty, again to discuss carpal tunnel release.  She unfortunately did not make it back to see him. She is awaiting until her surgery can be approved through [employer].    PERTINENT  PMH / PSH: ***  OBJECTIVE:   There were no vitals taken for this visit.   General: NAD, pleasant, able to participate in exam Cardiac: RRR, no murmurs. Respiratory: CTAB, normal effort, No wheezes, rales or rhonchi Abdomen: Bowel sounds present, nontender, nondistended, no hepatosplenomegaly. Extremities: no edema or cyanosis. Skin: warm and dry, no rashes noted Neuro: alert, no obvious focal deficits Psych: Normal affect and mood  ASSESSMENT/PLAN:   No problem-specific Assessment & Plan notes found for this encounter.     Sharion Settler, [place]

## 2022-05-12 NOTE — Patient Instructions (Incomplete)
It was wonderful to see you today.  Please bring ALL of your medications with you to every visit.   Today we talked about:  **  Thank you for coming to your visit as scheduled. We have had a large "no-show" problem lately, and this significantly limits our ability to see and care for patients. As a friendly reminder- if you cannot make your appointment please call to cancel. We do have a no show policy for those who do not cancel within 24 hours. Our policy is that if you miss or fail to cancel an appointment within 24 hours, 3 times in a 6-month period, you may be dismissed from our clinic.   Thank you for choosing  Family Medicine.   Please call [phone] with any questions about today's appointment.  Please be sure to schedule follow up at the front  desk before you leave today.   Raquelle Pietro, DO PGY-3 Family Medicine   

## 2022-05-16 ENCOUNTER — Encounter: Payer: Self-pay | Admitting: Family Medicine

## 2022-05-16 ENCOUNTER — Other Ambulatory Visit: Payer: Self-pay

## 2022-05-16 ENCOUNTER — Ambulatory Visit (INDEPENDENT_AMBULATORY_CARE_PROVIDER_SITE_OTHER): Payer: No Typology Code available for payment source | Admitting: Family Medicine

## 2022-05-16 VITALS — BP 157/87 | HR 112 | Ht 65.5 in | Wt 322.6 lb

## 2022-05-16 DIAGNOSIS — I10 Essential (primary) hypertension: Secondary | ICD-10-CM

## 2022-05-16 DIAGNOSIS — Z794 Long term (current) use of insulin: Secondary | ICD-10-CM | POA: Diagnosis not present

## 2022-05-16 DIAGNOSIS — E1165 Type 2 diabetes mellitus with hyperglycemia: Secondary | ICD-10-CM

## 2022-05-16 DIAGNOSIS — G5603 Carpal tunnel syndrome, bilateral upper limbs: Secondary | ICD-10-CM | POA: Diagnosis not present

## 2022-05-16 LAB — POCT GLYCOSYLATED HEMOGLOBIN (HGB A1C): HbA1c, POC (controlled diabetic range): 11 % — AB (ref 0.0–7.0)

## 2022-05-16 NOTE — Patient Instructions (Signed)
It was wonderful to see you today.  Please bring ALL of your medications with you to every visit.   Today we talked about:  It was great to see you today. I hope that you are able to get the surgery worked out with [employer]. We will get your FMLA form faxed over.  Thank you for coming to your visit as scheduled. We have had a large "no-show" problem lately, and this significantly limits our ability to see and care for patients. As a friendly reminder- if you cannot make your appointment please call to cancel. We do have a no show policy for those who do not cancel within 24 hours. Our policy is that if you miss or fail to cancel an appointment within 24 hours, 3 times in a 57-month period, you may be dismissed from our clinic.   Thank you for choosing [facility].   Please call [phone] with any questions about today's appointment.  Please be sure to schedule follow up at the front  desk before you leave today.   Sharion Settler, DO PGY-3 Family Medicine

## 2022-05-16 NOTE — Progress Notes (Signed)
    SUBJECTIVE:   CHIEF COMPLAINT / HPI:   Holly [name] is a 42 y.o. female who presents to the [facility] clinic today to discuss the following concerns:   Discuss FMLA for Carpal Tunnel Holly [name] has longstanding carpal tunnel.  She has been seen in our clinic as well as referred to sports medicine.  She was last seen by sports medicine clinic on 9/27. She has tried NSAIDs, Tylenol, voltaren gel with minimum relief. She has worn b/l wrist braces wtihout relief. She has had a NCV/EMGs which showed moderate carpal tunnel with evidence of denervation.  She was advised to see her orthopedist, Dr. Amedeo Plenty, again to discuss carpal tunnel release.  She unfortunately did not make it back to see him. She is awaiting until her surgery can be approved through [employer].   Diabetes, Type 2 - Last A1c 7.3 - Medications: Trulicity 1.27 mg weekly, Farxiga 5 mg daily, Humalog 8 units twice daily, metformin 1000 mg in AM, 500 mg in PM, 25U semglee - Compliance: Reports good compliance but does note she was out of a few medications for about a week    PERTINENT  PMH / PSH: T2DM, HTN  OBJECTIVE:   BP (!) 157/87   Pulse (!) 112   Ht 5' 5.5" (1.664 m)   Wt (!) 322 lb 9.6 oz (146.3 kg)   SpO2 97%   BMI 52.87 kg/m    General: NAD, pleasant, able to participate in exam Respiratory: normal effort Abdomen: Obese Psych: Normal affect and mood  ASSESSMENT/PLAN:   1. Type 2 diabetes mellitus with hyperglycemia, with long-term current use of insulin ([place]) Diabetes is not well controlled. A1c was previously well controlled at 7.3 in July, now significantly elevated to 11. Likely not helping her carpal tunnel as well. 61 of appointment was spent on discussing her carpal tunnel and completing FMLA paperwork, were unable to discuss her diabetes in much detail today other than it is not well controlled. I have scheduled her an appointment to meet with me next week to discuss further.  - Next HgB A1c in  3 months  -Returning next week to discuss further and make adjustments -Continue all her medications at this time  2. Carpal tunnel syndrome, bilateral Ongoing, chronic. Will not improve without surgical correction as she has failed all other conservative treatments. She is working to get the surgery approved under her workman's comp. 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 -FMLA paperwork completed today, will have front office fax it   3. Primary hypertension Elevated BP today. We were unable to recheck or make adjustments at this time given time constraints and other concerns above. Patient will follow up next week and adjustments can be made at that time if necessary/still elevated. Her BMP is UTD.    Sharion Settler, [place]

## 2022-05-25 NOTE — Progress Notes (Deleted)
    SUBJECTIVE:   CHIEF COMPLAINT / HPI:   Holly [name] is a 42 y.o. female who presents to the [facility] clinic today to discuss the following concerns:   Diabetes, Type 2 - Last A1c 11 in 04/2022 - Medications: Prescribed Semglee 25 units daily, metformin 1000 mg at in the morning, 500 mg in the evening, Humalog 8 units twice daily, Farxiga 5 mg daily, Trulicity 4.65 mg weekly - Compliance: *** - Checking BG at home: *** - Diet: *** - Exercise: *** - Eye exam: Due - Foot exam: Due - Microalbumin: Due - Statin: Atorvastatin 40 mg daily  - Denies symptoms of hypoglycemia, polyuria, polydipsia, numbness extremities, foot ulcers/trauma  Hypertension F/U Current medications include Losartan-hydrochlorothiazide 50-12.5 mg daily, amlodipine 10 mg. Reports good*** compliance. Home blood pressure readings range *** systolic and *** diastolic. Denies chest pain, shortness of breath, lower extremity edema, headaches, and vision changes. BMP UTD.   BP Readings from Last 3 Encounters:  05/16/22 (!) 157/87  04/06/22 122/78  02/08/22 128/70    PERTINENT  PMH / PSH: Carpal tunnel, asthma, class III obesity, hyperlipidemia, hypertension  OBJECTIVE:   There were no vitals taken for this visit.***  General: NAD, pleasant, able to participate in exam Cardiac: RRR, no murmurs. Respiratory: CTAB, normal effort, No wheezes, rales or rhonchi Abdomen: Bowel sounds present, nontender, nondistended, no hepatosplenomegaly. Extremities: no edema or cyanosis. Skin: warm and dry, no rashes noted Neuro: alert, no obvious focal deficits Psych: Normal affect and mood  ASSESSMENT/PLAN:   No problem-specific Assessment & Plan notes found for this encounter.     Sharion Settler, [place]

## 2022-05-25 NOTE — Patient Instructions (Incomplete)
It was wonderful to see you today.  Please bring ALL of your medications with you to every visit.   Today we talked about:  **  Thank you for coming to your visit as scheduled. We have had a large "no-show" problem lately, and this significantly limits our ability to see and care for patients. As a friendly reminder- if you cannot make your appointment please call to cancel. We do have a no show policy for those who do not cancel within 24 hours. Our policy is that if you miss or fail to cancel an appointment within 24 hours, 3 times in a 6-month period, you may be dismissed from our clinic.   Thank you for choosing [facility].   Please call [phone] with any questions about today's appointment.  Please be sure to schedule follow up at the front  desk before you leave today.   Kaiyu Mirabal, DO PGY-3 Family Medicine   

## 2022-05-26 ENCOUNTER — Ambulatory Visit: Payer: Self-pay | Admitting: Family Medicine

## 2022-05-26 DIAGNOSIS — Z794 Long term (current) use of insulin: Secondary | ICD-10-CM

## 2022-06-07 ENCOUNTER — Other Ambulatory Visit: Payer: Self-pay | Admitting: Family Medicine

## 2022-06-07 DIAGNOSIS — I10 Essential (primary) hypertension: Secondary | ICD-10-CM

## 2022-06-07 DIAGNOSIS — E1165 Type 2 diabetes mellitus with hyperglycemia: Secondary | ICD-10-CM

## 2022-06-15 ENCOUNTER — Ambulatory Visit (INDEPENDENT_AMBULATORY_CARE_PROVIDER_SITE_OTHER): Payer: No Typology Code available for payment source

## 2022-06-15 VITALS — BP 154/106 | HR 91 | Ht 66.0 in | Wt 323.2 lb

## 2022-06-15 DIAGNOSIS — I10 Essential (primary) hypertension: Secondary | ICD-10-CM

## 2022-06-15 DIAGNOSIS — L03011 Cellulitis of right finger: Secondary | ICD-10-CM

## 2022-06-15 MED ORDER — DOXYCYCLINE HYCLATE 100 MG PO TABS: 100.0000 mg | ORAL_TABLET | Freq: Two times a day (BID) | ORAL | 0 refills | Status: AC

## 2022-06-15 NOTE — Assessment & Plan Note (Signed)
Presentation consistent with acute paronychia of R index finger. Has not responded to conservative measures and warm soaks. No fluid pocket or abscess to drain on exam. No evidence of felon. Rx sent for doxycycline '100mg'$  BID. Return if no improvement.

## 2022-06-15 NOTE — Patient Instructions (Addendum)
It was great to see you!  I'm sorry about your finger infection.  Keep doing the warm epsom salt soaks 3 times per day. I have sent an antibiotic to your pharmacy to take twice daily with food.  If your symptoms are not improved in 1 week please let us know.  Take care, Dr Rock Nephew  Paronychia Paronychia is an infection of the skin that surrounds a nail. It usually affects the skin around a fingernail, but it may also occur near a toenail. It often causes pain and swelling around the nail. In some cases, a collection of pus (abscess) can form near or under the nail.  This condition may develop suddenly, or it may develop gradually over a longer period. In most cases, paronychia is not serious, and it will clear up with treatment. What are the causes? This condition may be caused by bacteria or a fungus, such as yeast. The bacteria or fungus can enter the body through an opening in the skin, such as a cut or a hangnail, and cause an infection in your fingernail or toenail. Other causes may include: Recurrent injury to the fingernail or toenail area. Irritation of the base and sides of the nail (cuticle). Injury and irritation can result in inflammation, swelling, and thickened skin around the nail. What increases the risk? This condition is more likely to develop in people who: Get their hands wet often, such as those who work as Designer, industrial/product, bartenders, or housekeepers. Bite their fingernails or cuticles. Have underlying skin conditions. Have hangnails or injured fingertips. Are exposed to irritants like detergents and other chemicals. Have diabetes. What are the signs or symptoms? Symptoms of this condition include: Redness and swelling of the skin near the nail. Tenderness around the nail when you touch the area. Pus-filled bumps under the cuticle. Fluid or pus under the nail. Throbbing pain in the area. How is this diagnosed? This condition is diagnosed with a physical exam. In  some cases, a sample of pus may be tested to determine what type of bacteria or fungus is causing the condition. How is this treated? Treatment depends on the cause and severity of your condition. If your condition is mild, it may clear up on its own in a few days or after soaking in warm water. If needed, treatment may include: Antibiotic medicine, if your infection is caused by bacteria. Antifungal medicine, if your infection is caused by a fungus. A procedure to drain pus from an abscess. Anti-inflammatory medicine (corticosteroids). Removal of part of an ingrown toenail. A bandage (dressing) may be placed over the affected area if an abscess or part of a nail has been removed. Follow these instructions at home: Wound care Keep the affected area clean. Soak the affected area in warm water if told to do so by your health care provider. You may be told to do this for 20 minutes, 2-3 times a day. Keep the area dry when you are not soaking it. Do not try to drain an abscess yourself. Follow instructions from your health care provider about how to take care of the affected area. Make sure you: Wash your hands with soap and water for at least 20 seconds before and after you change your dressing. If soap and water are not available, use hand sanitizer. Change your dressing as told by your health care provider. If you had an abscess drained, check the area every day for signs of infection. Check for: Redness, swelling, or pain. Fluid or blood. Warmth.  Pus or a bad smell. Medicines  Take over-the-counter and prescription medicines only as told by your health care provider. If you were prescribed an antibiotic medicine, take it as told by your health care provider. Do not stop taking the antibiotic even if you start to feel better. General instructions Avoid contact with any skin irritants or allergens. Do not pick at the affected area. Keep all follow-up visits as told. This is  important. Prevention To prevent this condition from happening again: Wear rubber gloves when washing dishes or doing other tasks that require your hands to get wet. Wear gloves if your hands might come in contact with cleaners or other chemicals. Avoid injuring your nails or fingertips. Do not bite your nails or tear hangnails. Do not cut your nails very short. Do not cut your cuticles. Use clean nail clippers or scissors when trimming nails. Contact a health care provider if: Your symptoms get worse or do not improve with treatment. You have continued or increased fluid, blood, or pus coming from the affected area. Your affected finger, toe, or joint becomes swollen or difficult to move. You have a fever or chills. There is redness spreading away from the affected area. Summary Paronychia is an infection of the skin that surrounds a nail. It often causes pain and swelling around the nail. In some cases, a collection of pus (abscess) can form near or under the nail. This condition may be caused by bacteria or a fungus. These germs can enter the body through an opening in the skin, such as a cut or a hangnail. If your condition is mild, it may clear up on its own in a few days. If needed, treatment may include medicine or a procedure to drain pus from an abscess. To prevent this condition from happening again, wear gloves if doing tasks that require your hands to get wet or to come in contact with chemicals. Also avoid injuring your nails or fingertips. This information is not intended to replace advice given to you by your health care provider. Make sure you discuss any questions you have with your health care provider. Document Revised: 07/05/2020 Document Reviewed: 07/05/2020 Elsevier Patient Education  [place].

## 2022-06-15 NOTE — Progress Notes (Signed)
    SUBJECTIVE:   CHIEF COMPLAINT / HPI:   R Index Finger Pain -Started a little over 1 week ago after pulling hangnail off -Worsening since that time -Becoming more red and swollen -Painful mainly to touch -Located at lateral aspect of nailbed -No fever or other systemic symptoms -Cleansed it with hydrogen peroxide, applied ice, also been doing epsom soaks without improvement  PERTINENT  PMH / PSH: HTN, T2DM  OBJECTIVE:   BP (!) 154/106   Pulse 91   Ht 5' 6"$  (1.676 m)   Wt (!) 323 lb 3.2 oz (146.6 kg)   SpO2 98%   BMI 52.17 kg/m   General: NAD, pleasant, able to participate in exam Respiratory: No respiratory distress Extremities: R index finger with mild erythema and swelling along lateral aspect of nailbed, moderate tenderness to palpation along edge of nailbed, no appreciable abscess or fluid collection, no fat pad involvement, full ROM of all fingers Skin: warm and dry, no rashes noted Psych: Normal affect and mood Neuro: grossly intact   ASSESSMENT/PLAN:   Paronychia of finger, right Presentation consistent with acute paronychia of R index finger. Has not responded to conservative measures and warm soaks. No fluid pocket or abscess to drain on exam. No evidence of felon. Rx sent for doxycycline 112m BID. Return if no improvement.  HTN (hypertension) BP elevated today x2. Has not taken medications today. Recommend PCP follow-up at her earliest convenience.   AAlcus Dad MD [facility]

## 2022-06-15 NOTE — Assessment & Plan Note (Signed)
BP elevated today x2. Has not taken medications today. Recommend PCP follow-up at her earliest convenience.

## 2022-07-12 ENCOUNTER — Other Ambulatory Visit: Payer: Self-pay | Admitting: Family Medicine

## 2022-09-26 ENCOUNTER — Encounter: Payer: Self-pay | Admitting: Family Medicine

## 2022-09-26 ENCOUNTER — Ambulatory Visit (INDEPENDENT_AMBULATORY_CARE_PROVIDER_SITE_OTHER): Payer: No Typology Code available for payment source | Admitting: Family Medicine

## 2022-09-26 VITALS — BP 138/86 | HR 88 | Ht 65.5 in | Wt 325.0 lb

## 2022-09-26 DIAGNOSIS — E119 Type 2 diabetes mellitus without complications: Secondary | ICD-10-CM | POA: Diagnosis not present

## 2022-09-26 LAB — POCT GLYCOSYLATED HEMOGLOBIN (HGB A1C): HbA1c, POC (controlled diabetic range): 12 % — AB (ref 0.0–7.0)

## 2022-09-26 MED ORDER — TRULICITY 1.5 MG/0.5ML ~~LOC~~ SOAJ
1.5000 mg | SUBCUTANEOUS | 1 refills | Status: DC
Start: 2022-09-26 — End: 2022-10-13

## 2022-09-26 NOTE — Progress Notes (Signed)
    SUBJECTIVE:   CHIEF COMPLAINT / HPI:   Diabetes  Patient has been taking her medications including farxiga, trulicity, and insulin. Checks her blood sugar most of the time. Says insurance would not pay for CGM or that it was very expensive.   PERTINENT  PMH / PSH: Diabetes, HTN   OBJECTIVE:   BP 138/86   Pulse 88   Ht 5' 5.5" (1.664 m)   Wt (!) 325 lb (147.4 kg)   SpO2 97%   BMI 53.26 kg/m   General: well appearing, in no acute distress CV: RRR, radial pulses equal and palpable, no BLE edema  Resp: Normal work of breathing on room air, CTAB Abd: Soft, non tender, non distended  Neuro: Alert & Oriented x 4  Foot: slightly thickened toe nails otherwise no lesions on either foot. Sensation intact throughout both feet.    ASSESSMENT/PLAN:   Diabetes mellitus without complication (HCC) Assessment & Plan: Patient has uncontrolled diabetes with A1c 12.0 worse than 11.0 before.  Plan to increase trulicity dose and increase insulin at 1 unit increase of semglee for every day fasting sugar is > 150.  - Follow up in 2 months.   Orders: -     POCT glycosylated hemoglobin (Hb A1C) -     Basic metabolic panel -     Microalbumin / creatinine urine ratio -     Trulicity; Inject 1.5 mg into the skin once a week.  Dispense: 0.5 mL; Refill: 1 -     Ambulatory referral to Ophthalmology      [name], MD [facility]

## 2022-09-26 NOTE — Patient Instructions (Signed)
It was wonderful to see you today.  Please bring ALL of your medications with you to every visit.   Today we talked about:  Diabetes: Please increase your semglee daily by 1 unit every day that your fasting blood sugar is > 150. I have prescribed a higher dose of trulicity.  I have put in a referral to an eye doctor to get a diabetic eye exam.  I will be getting some labs today for your diabetes. Will follow up regarding the results.    Emotional Support Animal: [email or web address]-[email or web address]  Please follow up in 3 months   Thank you for choosing [facility].   Please call [phone] with any questions about today's appointment.  Please be sure to schedule follow up at the front desk before you leave today.   [name], MD  Family Medicine

## 2022-09-27 LAB — BASIC METABOLIC PANEL
BUN/Creatinine Ratio: 7 — ABNORMAL LOW (ref 9–23)
BUN: 5 mg/dL — ABNORMAL LOW (ref 6–24)
CO2: 26 mmol/L (ref 20–29)
Calcium: 9.3 mg/dL (ref 8.7–10.2)
Chloride: 95 mmol/L — ABNORMAL LOW (ref 96–106)
Creatinine, Ser: 0.72 mg/dL (ref 0.57–1.00)
Glucose: 355 mg/dL — ABNORMAL HIGH (ref 70–99)
Potassium: 4.5 mmol/L (ref 3.5–5.2)
Sodium: 135 mmol/L (ref 134–144)
eGFR: 107 mL/min/{1.73_m2} (ref >59)

## 2022-09-27 LAB — MICROALBUMIN / CREATININE URINE RATIO
Creatinine, Urine: 40.8 mg/dL
Microalb/Creat Ratio: 46 mg/g creat — ABNORMAL HIGH (ref 0–29)
Microalbumin, Urine: 18.9 ug/mL

## 2022-09-27 NOTE — Assessment & Plan Note (Signed)
Patient has uncontrolled diabetes with A1c 12.0 worse than 11.0 before.  Plan to increase trulicity dose and increase insulin at 1 unit increase of semglee for every day fasting sugar is > 150.  - Follow up in 2 months.

## 2022-09-27 NOTE — Assessment & Plan Note (Signed)
>>  ASSESSMENT AND PLAN FOR DIABETES MELLITUS WITHOUT COMPLICATION (HCC) WRITTEN ON 09/27/2022 10:11 PM BY Adeola Dennen, MD  Patient has uncontrolled diabetes with A1c 12.0 worse than 11.0 before.  Plan to increase trulicity  dose and increase insulin  at 1 unit increase of semglee  for every day fasting sugar is > 150.  - Follow up in 2 months.

## 2022-09-28 ENCOUNTER — Encounter: Payer: Self-pay | Admitting: Family Medicine

## 2022-09-28 ENCOUNTER — Telehealth: Payer: Self-pay

## 2022-09-28 ENCOUNTER — Other Ambulatory Visit (HOSPITAL_COMMUNITY): Payer: Self-pay

## 2022-09-28 NOTE — Telephone Encounter (Signed)
A Prior Authorization was initiated for this patients TRULICITY through CoverMyMeds.   Key: [id number]

## 2022-09-29 ENCOUNTER — Telehealth: Payer: Self-pay | Admitting: Family Medicine

## 2022-09-29 NOTE — Telephone Encounter (Signed)
Prior Auth for patients medication TRULICITY approved by [employer] from 09/28/22 to 09/28/23.  CoverMyMeds Key: [id number] [place] Case ID #: [id number]

## 2022-09-29 NOTE — Telephone Encounter (Signed)
Called patient to schedule 2 week follow up with Dr. Raymondo Band for blood glucose check.

## 2022-10-10 ENCOUNTER — Other Ambulatory Visit: Payer: Self-pay | Admitting: Family Medicine

## 2022-10-10 ENCOUNTER — Other Ambulatory Visit: Payer: Self-pay | Admitting: Student

## 2022-10-10 DIAGNOSIS — G5603 Carpal tunnel syndrome, bilateral upper limbs: Secondary | ICD-10-CM

## 2022-10-10 DIAGNOSIS — E1165 Type 2 diabetes mellitus with hyperglycemia: Secondary | ICD-10-CM

## 2022-10-10 MED ORDER — BASAGLAR KWIKPEN 100 UNIT/ML ~~LOC~~ SOPN: 25.0000 [IU] | PEN_INJECTOR | Freq: Every day | SUBCUTANEOUS | 2 refills | Status: DC

## 2022-10-13 ENCOUNTER — Encounter: Payer: Self-pay | Admitting: Pharmacist

## 2022-10-13 ENCOUNTER — Ambulatory Visit (INDEPENDENT_AMBULATORY_CARE_PROVIDER_SITE_OTHER): Payer: No Typology Code available for payment source | Admitting: Pharmacist

## 2022-10-13 VITALS — BP 138/88 | HR 87 | Ht 65.0 in | Wt 323.8 lb

## 2022-10-13 DIAGNOSIS — Z794 Long term (current) use of insulin: Secondary | ICD-10-CM | POA: Diagnosis not present

## 2022-10-13 DIAGNOSIS — I10 Essential (primary) hypertension: Secondary | ICD-10-CM

## 2022-10-13 DIAGNOSIS — E782 Mixed hyperlipidemia: Secondary | ICD-10-CM | POA: Diagnosis not present

## 2022-10-13 DIAGNOSIS — E1165 Type 2 diabetes mellitus with hyperglycemia: Secondary | ICD-10-CM

## 2022-10-13 MED ORDER — METFORMIN HCL ER 500 MG PO TB24
1000.0000 mg | ORAL_TABLET | Freq: Every day | ORAL | 3 refills | Status: DC
Start: 2022-10-13 — End: 2023-06-26

## 2022-10-13 MED ORDER — FREESTYLE LIBRE 3 SENSOR MISC
1.0000 | 11 refills | Status: DC
Start: 2022-10-13 — End: 2023-11-29

## 2022-10-13 MED ORDER — HUMALOG KWIKPEN 200 UNIT/ML ~~LOC~~ SOPN
8.0000 [IU] | PEN_INJECTOR | Freq: Every day | SUBCUTANEOUS | 11 refills | Status: DC
Start: 2022-10-13 — End: 2023-06-27

## 2022-10-13 MED ORDER — BASAGLAR KWIKPEN 100 UNIT/ML ~~LOC~~ SOPN
30.0000 [IU] | PEN_INJECTOR | Freq: Every day | SUBCUTANEOUS | 11 refills | Status: DC
Start: 2022-10-13 — End: 2022-12-15

## 2022-10-13 MED ORDER — TRULICITY 1.5 MG/0.5ML ~~LOC~~ SOAJ
1.5000 mg | SUBCUTANEOUS | 5 refills | Status: DC
Start: 2022-10-13 — End: 2022-12-15

## 2022-10-13 NOTE — Progress Notes (Signed)
S:     Chief Complaint  Patient presents with   Medication Management    Diabetes - CGM - Libre 3 Start   42 y.o. female who presents for diabetes evaluation, education, and management. Patient arrives in good spirits and presents without any assistance.    Patient was referred and last seen by Primary Care Provider, Dr. Marsh Dolly, on 09/26/2022.   PMH is significant for Diabetes and Obesity.  At last visit, patient was instructed to titrate daily insulin and increase dose of GLP.    Current diabetes medications include: Semglee (insulin glargine) 30 units daily, Humalog 5 units prior to largest meal of the day and Trulicity (dulaglutide) 0.75mg   (did not yet increase this dose from previous plan) Current hypertension medications include: Losartan/hydrochlorothiazide  50/12.5mg  daily Current hyperlipidemia medications include: Atorvastatin 40mg daily  Patient reports adherence to taking all medications as prescribed.   Do you feel that your medications are working for you? yes Have you been experiencing any side effects to the medications prescribed? no  Patient denies hypoglycemic events.  Reported home fasting blood sugars: 140-150  Reported 2 hour post-meal/random blood sugars: Not checking routinely.  Patient reported dietary habits: Eats 3 meals/day plus evening snack most nights Reports trying to eat more protein and less carbohydrate.  Trying to increase vegetable intake. Breakfast: Egg wrap or fruit smoothie with protein Lunch: Taco wrap with avocado and ricotta Dinner: Vegetables larger meal Snacks: PB shake with omega and hemp additives  Patient-reported exercise habits: Walks in yard   O:   Review of Systems  All other systems reviewed and are negative.   Physical Exam Vitals reviewed.  Constitutional:      Appearance: Normal appearance.  Pulmonary:     Effort: Pulmonary effort is normal.  Neurological:     Mental Status: She is alert.  Psychiatric:         Mood and Affect: Mood normal.        Behavior: Behavior normal.        Thought Content: Thought content normal.        Judgment: Judgment normal.      Lab Results  Component Value Date   HGBA1C 12.0 (A) 09/26/2022   Vitals:   10/13/22 0856 10/13/22 0905  BP: (!) 143/95 138/88  Pulse: 87   SpO2: 97%     Lipid Panel     Component Value Date/Time   CHOL 151 05/20/2021 1101   TRIG 135 05/20/2021 1101   HDL 30 (L) 05/20/2021 1101   CHOLHDL 5.0 (H) 05/20/2021 1101   CHOLHDL 5.4 (H) 08/05/2015 1408   VLDL 26 08/05/2015 1408   LDLCALC 97 05/20/2021 1101    Clinical Atherosclerotic Cardiovascular Disease (ASCVD): No  The 10-year ASCVD risk score (Arnett DK, et al., 2019) is: 16.9%   Values used to calculate the score:     Age: 26 years     Sex: Female     Is Non-Hispanic African American: Yes     Diabetic: Yes     Tobacco smoker: No     Systolic Blood Pressure: 138 mmHg     Is BP treated: Yes     HDL Cholesterol: 30 mg/dL     Total Cholesterol: 151 mg/dL   A/P: Diabetes longstanding and currently with suboptimal control. Patient is not currently testing routinely and would like to return to using CGM.  Spent majority of visit with Josephine Igo 3 App review and installation.  Two CGM sample sensors provided. She  has increased her dose of basal insulin but has not yet increased to higher dose of Trulicity. Medication adherence appears fair when dispense reports are queried. -Continued basal insulin Semglee (insulin glargine) at 30 units daily.  Switched to [employer] at next fill due to formulary preference per [facility] formulary.  -Increased dose of rapid insulin Humalog (insulin lispro) from 5 to 8 units prior to largest meal of the day.   -Increased dose of GLP-1 Trulicity (dulaglutide) from 0.75mg  to 1.5 mg  weekly.  -Continued SGLT2-I Farxiga (dapagliflozin) 5mg . Counseled on sick day rules. -Continued metformin 1000mg  daily.  -Patient educated on purpose, proper use, and  potential adverse effects. -Extensively discussed pathophysiology of diabetes, recommended lifestyle interventions, dietary effects on blood sugar control.  -Counseled on s/sx of and management of hypoglycemia.   ASCVD risk - primary prevention in patient with diabetes, hypertension and obesity. Last LDL is not at goal of <70 mg/dL. high intensity statin indicated.  -Continued atorvastatin 40 mg.  Lipid panel due - next lab assessment.   Hypertension longstanding and currently near goal with readings < 140/90. Blood pressure goal of <130/80 mmHg. Medication adherence good. . -Continued losartan/HCTZ mg at current dose today.  -Consider dose increase to 100/25mg  at next visit.   Written patient instructions provided. Patient verbalized understanding of treatment plan.  Total time in face to face counseling 51 minutes.    Follow-up:  Pharmacist 4 weeks. PCP clinic visit in PRN

## 2022-10-13 NOTE — Assessment & Plan Note (Signed)
Hypertension longstanding and currently near goal with readings < 140/90. Blood pressure goal of <130/80 mmHg. Medication adherence good. . -Continued losartan/HCTZ mg at current dose today.  -Consider dose increase to 100/25mg  at next visit

## 2022-10-13 NOTE — Assessment & Plan Note (Signed)
Diabetes longstanding and currently with suboptimal control. Patient is not currently testing routinely and would like to return to using CGM.  Spent majority of visit with Josephine Igo 3 App review and installation.  Two CGM sample sensors provided. She has increased her dose of basal insulin but has not yet increased to higher dose of Trulicity. Medication adherence appears fair when dispense reports are queried. -Continued basal insulin Semglee (insulin glargine) at 30 units daily.  Switched to [employer] at next fill due to formulary preference per [facility] formulary.  -Increased dose of rapid insulin Humalog (insulin lispro) from 5 to 8 units prior to largest meal of the day.   -Increased dose of GLP-1 Trulicity (dulaglutide) from 0.75mg  to 1.5 mg  weekly.  -Continued SGLT2-I Farxiga (dapagliflozin) 5mg . Counseled on sick day rules. -Continued metformin 1000mg  daily.  -Patient educated on purpose, proper use, and potential adverse effects. -Extensively discussed pathophysiology of diabetes, recommended lifestyle interventions, dietary effects on blood sugar control.  -Counseled on s/sx of and management of hypoglycemia.

## 2022-10-13 NOTE — Patient Instructions (Addendum)
It was nice to see you today!  Your goal blood sugar is 80-130 before eating and less than 180 after eating.  Medication Changes: Continue long-acting insulin same dose.   Increase Trulicity (dulaglutide) to 1.5mg   Increase dose of Mealtime insulin from 5 to 8 units with large meal of the day  Enjoy your Bryan 3 CGM  Keep up the good work with diet and exercise. Aim for a diet full of vegetables, fruit and lean meats (chicken, [place], fish). Try to limit salt intake by eating fresh or frozen vegetables (instead of canned), rinse canned vegetables prior to cooking and do not add any additional salt to meals.     Sensor Application If using the App, you can tap Help in the Main Menu to access an in-app tutorial on applying a Sensor. See below for instructions on how to download the app. Apply Sensors only on the back of your upper arm. If placed in other areas, the Sensor may not function properly and could give you inaccurate readings. Avoid areas with scars, moles, stretch marks, or lumps.   Select an area of skin that generally stays flat during your normal daily activities (no bending or folding). Choose a site that is at least 1 inch (2.5 cm) away from any injection sites. To prevent discomfort or skin irritation, you should select a different site other than the one most recently used. Wash application site using a plain soap, dry, and then clean with an alcohol wipe. This will help remove any oily residue that may prevent the sensor from sticking properly. Allow site to air dry before proceeding. Note: The area MUST be clean and dry, or the Sensor may not stay on for the full wear duration specified by your Sensor insert. 4. Unscrew the cap from the Sensor Applicator and set the cap aside.  5. Place the Sensor Applicator over the prepared site and push down firmly to apply the Sensor to your body. 6. Gently pull the Sensor Applicator away from your body. The Sensor should now be  attached to your skin. 7. Make sure the Sensor is secure after application. Put the cap back on the Sensor Applicator. Discard the used Engineer, agricultural according to local regulations.  What If My Sensor Falls Off or What If My Sensor Isn't Working? Call [employer] Customer Care Team at [phone] Available 7 days a week from 8AM-8PM EST, excluding holidays If yo have multiple sensors fall off prior to 14 days of use, contact [facility] at [phone]   The App Download the Free[employer] [place] 3 App in your phone's app store   Load the app and select get started now Create an account  Tap scan new sensor Follow the prompts on the screen. If your sensor does not sync, try moving your phone slowly around the sensor. Phone cases may affect scanning. This will be the only time you have to scan the sensor until you apply a new sensor in 14 days.  There will be a 60 minute start up period until the app will display your glucose reading   How To Share Your Readings With [place] Once in the app, go to settings -> connected apps -> LibreView -> Enter Practice ID -> [id number]

## 2022-10-13 NOTE — Assessment & Plan Note (Signed)
ASCVD risk - primary prevention in patient with diabetes, hypertension and obesity. Last LDL is not at goal of <70 mg/dL. high intensity statin indicated.  -Continued atorvastatin 40 mg.

## 2022-10-17 NOTE — Progress Notes (Signed)
Reviewed and agree with Dr Koval's plan.   

## 2022-11-10 ENCOUNTER — Ambulatory Visit: Payer: No Typology Code available for payment source | Admitting: Pharmacist

## 2022-11-24 ENCOUNTER — Ambulatory Visit: Payer: No Typology Code available for payment source | Admitting: Pharmacist

## 2022-12-08 ENCOUNTER — Encounter: Payer: Self-pay | Admitting: Family Medicine

## 2022-12-15 ENCOUNTER — Encounter: Payer: Self-pay | Admitting: Pharmacist

## 2022-12-15 ENCOUNTER — Other Ambulatory Visit: Payer: Self-pay | Admitting: Family Medicine

## 2022-12-15 ENCOUNTER — Ambulatory Visit (INDEPENDENT_AMBULATORY_CARE_PROVIDER_SITE_OTHER): Payer: No Typology Code available for payment source | Admitting: Pharmacist

## 2022-12-15 VITALS — BP 129/88 | HR 84 | Wt 324.0 lb

## 2022-12-15 DIAGNOSIS — E1165 Type 2 diabetes mellitus with hyperglycemia: Secondary | ICD-10-CM | POA: Diagnosis not present

## 2022-12-15 DIAGNOSIS — E119 Type 2 diabetes mellitus without complications: Secondary | ICD-10-CM | POA: Diagnosis not present

## 2022-12-15 DIAGNOSIS — E782 Mixed hyperlipidemia: Secondary | ICD-10-CM

## 2022-12-15 DIAGNOSIS — Z794 Long term (current) use of insulin: Secondary | ICD-10-CM

## 2022-12-15 DIAGNOSIS — I10 Essential (primary) hypertension: Secondary | ICD-10-CM

## 2022-12-15 MED ORDER — BD PEN NEEDLE NANO 2ND GEN 32G X 4 MM MISC
1.0000 | Freq: Three times a day (TID) | 1 refills | Status: DC
Start: 2022-12-15 — End: 2023-11-29

## 2022-12-15 MED ORDER — DAPAGLIFLOZIN PROPANEDIOL 10 MG PO TABS: 10.0000 mg | ORAL_TABLET | Freq: Every day | ORAL | 11 refills | Status: DC

## 2022-12-15 MED ORDER — ATORVASTATIN CALCIUM 40 MG PO TABS
40.0000 mg | ORAL_TABLET | Freq: Every day | ORAL | 11 refills | Status: DC
Start: 2022-12-15 — End: 2024-02-06

## 2022-12-15 MED ORDER — INSULIN GLARGINE-YFGN 100 UNIT/ML ~~LOC~~ SOPN
40.0000 [IU] | PEN_INJECTOR | Freq: Every day | SUBCUTANEOUS | 3 refills | Status: DC
Start: 2022-12-15 — End: 2022-12-19

## 2022-12-15 MED ORDER — DULAGLUTIDE 3 MG/0.5ML ~~LOC~~ SOAJ
3.0000 mg | SUBCUTANEOUS | 3 refills | Status: DC
Start: 2022-12-15 — End: 2023-06-19

## 2022-12-15 NOTE — Assessment & Plan Note (Signed)
Hypertension longstanding currently at goal of < 130/80 mm Hg. Medication adherence good.  -Continued losartan/hydrochlorothiazide and amlopidine

## 2022-12-15 NOTE — Assessment & Plan Note (Signed)
Diabetes longstanding and currently appears to be improved since previous visit. Patient is able to verbalize appropriate hypoglycemia management plan. Medication adherence appears good. -Continued basal insulin Semglee (insulin glargine) at current dose 40 units until her next dose of Trulicity at higher dose is administered (9/13). -Continued rapid insulin Humalog (insulin lispro) at 5 units prior to meals.  -Increased dose of GLP-1 Trulicity (dulaglutide) from 1.5 to 3mg  weekly starting 9/13 at which time she will reduce insulin glargine dose to 30 units daily.  -Increased dose of SGLT2-I Farxiga (dapagliflozin) from 5 to 10mg  daily.  Jardiance (empagliflozin) 10 mg. Counseled on sick day rules. -Continued metformin 1000mg  daily (XR).  -Patient educated on purpose, proper use, and potential adverse effects.  -Extensively discussed pathophysiology of diabetes, recommended lifestyle interventions, dietary effects on blood sugar control.

## 2022-12-15 NOTE — Patient Instructions (Signed)
It was nice to see you today!  Your goal blood sugar is 80-130 before eating and less than 180 after eating.  Medication Changes:  Increase Farxiga (dapagliflozin) to 10mg  (take 2 of the 5mg  tablets until gone)  On September 13th - Start Trulicity 3mg  weekly.  At that time, decrease Semglee to 30 units once daily.    Continue all other medication the same.   Monitor blood sugars at home and keep a log (glucometer or piece of paper) to bring with you to your next visit.  Keep up the good work with diet and exercise. Aim for a diet full of vegetables, fruit and lean meats (chicken, [place], fish). Try to limit salt intake by eating fresh or frozen vegetables (instead of canned), rinse canned vegetables prior to cooking and do not add any additional salt to meals.

## 2022-12-15 NOTE — Assessment & Plan Note (Signed)
ASCVD risk - primary prevention in patient with diabetes.  -Continued atorvastatin 40 mg.

## 2022-12-15 NOTE — Progress Notes (Signed)
Reviewed and agree with Dr Koval's plan.   

## 2022-12-15 NOTE — Progress Notes (Signed)
S:     Chief Complaint  Patient presents with   Medication Management    diabetes   42 y.o. female who presents for diabetes evaluation, education, and management. Patient arrives in good spirits and presents without any assistance.  Patient was referred and last seen by Primary Care Provider, Dr. Marsh Dolly, on 09/26/2022.   PMH is significant for Hyperlipidemia, Hypertension and Diabetes.  At last visit, we attempted to initiate CGM sensors and patient tried but unfortunately did not have success with use (adhesion) - restart of CGM was not focus of today's visit. .   Current diabetes medications include: Dapagliflozin 5mg , Semglee (insulin glargine) 40 units daily, Humalog (insulin lispro) 5 untis with meals, and Trulicity (dulaglutide) 1.5mg  weekly.  Current hypertension medications include: losartan/hydrochlorothiazide  and amlopidine Current hyperlipidemia medications include: atorvastatin 40mg   Patient reports adherence to taking all medications as prescribed.   Patient denies hypoglycemic events.  O:   Review of Systems  All other systems reviewed and are negative.   Physical Exam Constitutional:      Appearance: Normal appearance.  Pulmonary:     Effort: Pulmonary effort is normal.  Neurological:     Mental Status: She is alert.  Psychiatric:        Mood and Affect: Mood normal.        Thought Content: Thought content normal.   Reports home blood glucose readings in mid-100s fasting.   Lab Results  Component Value Date   HGBA1C 12.0 (A) 09/26/2022   Vitals:   12/15/22 1103 12/15/22 1114  BP: (!) 142/88 129/88  Pulse: 84   SpO2: 97%     Lipid Panel     Component Value Date/Time   CHOL 151 05/20/2021 1101   TRIG 135 05/20/2021 1101   HDL 30 (L) 05/20/2021 1101   CHOLHDL 5.0 (H) 05/20/2021 1101   CHOLHDL 5.4 (H) 08/05/2015 1408   VLDL 26 08/05/2015 1408   LDLCALC 97 05/20/2021 1101    Clinical Atherosclerotic Cardiovascular Disease (ASCVD):  The  10-year ASCVD risk score (Arnett DK, et al., 2019) is: 12.5%   Values used to calculate the score:     Age: 23 years     Sex: Female     Is Non-Hispanic African American: Yes     Diabetic: Yes     Tobacco smoker: No     Systolic Blood Pressure: 129 mmHg     Is BP treated: Yes     HDL Cholesterol: 30 mg/dL     Total Cholesterol: 151 mg/dL    A/P: Diabetes longstanding and currently appears to be improved since previous visit. Patient is able to verbalize appropriate hypoglycemia management plan. Medication adherence appears good. -Continued basal insulin Semglee (insulin glargine) at current dose 40 units until her next dose of Trulicity at higher dose is administered (9/13). -Continued rapid insulin Humalog (insulin lispro) at 5 units prior to meals.  -Increased dose of GLP-1 Trulicity (dulaglutide) from 1.5 to 3mg  weekly starting 9/13 at which time she will reduce insulin glargine dose to 30 units daily.  -Increased dose of SGLT2-I Farxiga (dapagliflozin) from 5 to 10mg  daily.  Jardiance (empagliflozin) 10 mg. Counseled on sick day rules. -Continued metformin 1000mg  daily (XR).  -Patient educated on purpose, proper use, and potential adverse effects.  -Extensively discussed pathophysiology of diabetes, recommended lifestyle interventions, dietary effects on blood sugar control.  -Counseled on s/sx of and management of hypoglycemia.  -Next A1c anticipated 1-2 months  ASCVD risk - primary prevention  in patient with diabetes.  -Continued atorvastatin 40 mg.   Hypertension longstanding currently at goal of < 130/80 mm Hg. Medication adherence good.  -Continued losartan/hydrochlorothiazide and amlopidine  Written patient instructions provided. Patient verbalized understanding of treatment plan.  Total time in face to face counseling 32 minutes.    Follow-up:  Pharmacist 5 weeks (PCP for FMLA paperwork in 2 weeks)

## 2022-12-16 ENCOUNTER — Other Ambulatory Visit: Payer: Self-pay | Admitting: Family Medicine

## 2022-12-16 MED ORDER — DAPAGLIFLOZIN PROPANEDIOL 10 MG PO TABS
10.0000 mg | ORAL_TABLET | Freq: Every day | ORAL | 0 refills | Status: DC
Start: 2022-12-16 — End: 2022-12-19

## 2022-12-19 MED ORDER — BASAGLAR KWIKPEN 100 UNIT/ML ~~LOC~~ SOPN: 40.0000 [IU] | PEN_INJECTOR | Freq: Every day | SUBCUTANEOUS | 1 refills | Status: DC

## 2022-12-19 MED ORDER — DAPAGLIFLOZIN PROPANEDIOL 10 MG PO TABS: 10.0000 mg | ORAL_TABLET | Freq: Every day | ORAL | 0 refills | Status: DC

## 2022-12-21 ENCOUNTER — Telehealth: Payer: Self-pay

## 2022-12-21 ENCOUNTER — Other Ambulatory Visit (HOSPITAL_COMMUNITY): Payer: Self-pay

## 2022-12-21 NOTE — Telephone Encounter (Signed)
Pharmacy Patient Advocate Encounter  Received notification from [employer] that Prior Authorization for TRULICITY 3MG  has been APPROVED from 12/21/22 to 12/21/23   PA #/Case ID/Reference #: [id number]

## 2022-12-21 NOTE — Telephone Encounter (Signed)
Pharmacy Patient Advocate Encounter   Received notification from CoverMyMeds that prior authorization for TRULICITY 3MG  is required/requested.   Insurance verification completed.   The patient is insured through [employer] .   Per test claim: PA required; PA submitted to [employer] via CoverMyMeds Key/confirmation #/EOC [id number]. Status is pending

## 2022-12-22 ENCOUNTER — Telehealth: Payer: Self-pay | Admitting: Family Medicine

## 2022-12-22 NOTE — Telephone Encounter (Signed)
Patient cam in to pick up FMLA paperwork, stated that that is was filled out for her diabetes instead of her carpal tunnel. Asks if doctor could please give her a call.

## 2022-12-25 ENCOUNTER — Ambulatory Visit (INDEPENDENT_AMBULATORY_CARE_PROVIDER_SITE_OTHER): Payer: No Typology Code available for payment source | Admitting: Student

## 2022-12-25 ENCOUNTER — Encounter: Payer: Self-pay | Admitting: Student

## 2022-12-25 ENCOUNTER — Other Ambulatory Visit: Payer: Self-pay

## 2022-12-25 VITALS — BP 138/92 | HR 87 | Ht 65.0 in | Wt 332.4 lb

## 2022-12-25 DIAGNOSIS — E1165 Type 2 diabetes mellitus with hyperglycemia: Secondary | ICD-10-CM

## 2022-12-25 DIAGNOSIS — Z794 Long term (current) use of insulin: Secondary | ICD-10-CM | POA: Diagnosis not present

## 2022-12-25 DIAGNOSIS — E119 Type 2 diabetes mellitus without complications: Secondary | ICD-10-CM | POA: Diagnosis not present

## 2022-12-25 DIAGNOSIS — G5603 Carpal tunnel syndrome, bilateral upper limbs: Secondary | ICD-10-CM | POA: Diagnosis not present

## 2022-12-25 LAB — POCT GLYCOSYLATED HEMOGLOBIN (HGB A1C): HbA1c, POC (controlled diabetic range): 11.5 % — AB (ref 0.0–7.0)

## 2022-12-25 NOTE — Patient Instructions (Signed)
It was wonderful to meet you today. Thank you for allowing me to be a part of your care. Below is a short summary of what we discussed at your visit today:  Your A1c today is 11.5.  Your blood sugar is still not well-controlled despite being on medical multiple medications for diabetes.  I recommend following up with our pharmacy team to for further evaluate your blood sugars.  Please make sure to bring your medications for the pharmacy appointment.  Fortunately I am unable to fill out your FMLA for carpal tunnel as this is not very common for is here.  I recommend following up with your surgeon who is better equipped to manage this and also given he is planning to perform surgery for this he would be the best option to complete the FMLA if indicated.    Please bring all of your medications to every appointment!  If you have any questions or concerns, please do not hesitate to contact us via phone or MyChart message.   Jerre Simon, MD [facility]

## 2022-12-25 NOTE — Assessment & Plan Note (Signed)
>>  ASSESSMENT AND PLAN FOR DIABETES MELLITUS WITHOUT COMPLICATION (HCC) WRITTEN ON 12/25/2022  5:29 PM BY ROSENDO RUSH, MD  Poorly controlled diabetes with A1c of 11.5 today.  There are multiple diabetes medication and endorsing compliance to medication and improved dieting including mostly vegetables and fruits.  Frankly unclear as to why patient's DM is still poorly controlled despite being on multiple DM medications and document of dieting.  Will recommend follow-up with Dr. Koval with pharmacy team and advised patient to bring all occasion to ensure patient is taking medications as prescribed.  At this time will not make any changes to medication until patient sees Dr. Koval.

## 2022-12-25 NOTE — Progress Notes (Signed)
    SUBJECTIVE:   CHIEF COMPLAINT / HPI:   Diabetes, Type 2 - Last A1c -12.0 from 3 months ago. - Medications: Metformin 1000 mg daily, insulin 40U daily + 8u with meals, Farxiga 10 mg daily, Trileptal 1mg  mg weekly. - Compliance: Good per patient  - Checking BG at home: 140-160 - Diet: Fruits and vegetables, avoid red meat but does [place] - Exercise: Walk dog once a day 5 days a week - Statin: Lipitor 40 mg daily - Denies symptoms of hypoglycemia, polyuria, polydipsia, numbness extremities, foot ulcers/trauma    FMLA paperwork for carpal tunnel Patient reports she would like to get FMLA form completed for carpa tunnel Reports occassional flair up and arms lock you interfering with typing at work Has seen multiple specialist including sports medicine, physical therapist and hand surgergon Hydrographic surveyor plans to perform hand surgery as soon as workers comp approved Not a good candidtate for intra articular steroid given poorly comntrolled diabetes  PERTINENT  PMH / PSH: Reviewed   OBJECTIVE:   BP (!) 138/92   Pulse 87   Ht 5\' 5"  (1.651 m)   Wt (!) 332 lb 6.4 oz (150.8 kg)   SpO2 97%   BMI 55.31 kg/m     Physical Exam General: Alert, morbidly obese, NAD Cardiovascular: RRR, No Murmurs, Normal S2/S2 Respiratory: CTAB, No wheezing or Rales Abdomen: No distension or tenderness Extremities: No edema on extremities   Skin: Warm and dry  ASSESSMENT/PLAN:   Diabetes mellitus without complication (HCC) Poorly controlled diabetes with A1c of 11.5 today.  There are multiple diabetes medication and endorsing compliance to medication and improved dieting including mostly vegetables and fruits.  Frankly unclear as to why patient's DM is still poorly controlled despite being on multiple DM medications and document of dieting.  Will recommend follow-up with Dr. Raymondo Band with pharmacy team and advised patient to bring all occasion to ensure patient is taking medications as prescribed.  At  this time will not make any changes to medication until patient sees Dr. Raymondo Band.  Carpal tunnel syndrome, bilateral Patient wanting FMLA to be completed for carpal tunnel.  Endorses seeing hand surgery who plans to perform juror pending approval of Worker's Comp.  Locked and to complete patient's FMLA at this time but encourage patient that she should follow-up with her hand surgeon who is managing at this time and have better understanding of her prognosis to complete her FMLA.  Reached out to patient's PCP to discuss visit today and deferred discretion to complete the FMLA form to the PCP.     Jerre Simon, MD [facility]

## 2022-12-25 NOTE — Assessment & Plan Note (Signed)
Poorly controlled diabetes with A1c of 11.5 today.  There are multiple diabetes medication and endorsing compliance to medication and improved dieting including mostly vegetables and fruits.  Frankly unclear as to why patient's DM is still poorly controlled despite being on multiple DM medications and document of dieting.  Will recommend follow-up with Dr. Raymondo Band with pharmacy team and advised patient to bring all occasion to ensure patient is taking medications as prescribed.  At this time will not make any changes to medication until patient sees Dr. Raymondo Band.

## 2022-12-25 NOTE — Assessment & Plan Note (Signed)
Patient wanting FMLA to be completed for carpal tunnel.  Endorses seeing hand surgery who plans to perform juror pending approval of Worker's Comp.  Locked and to complete patient's FMLA at this time but encourage patient that she should follow-up with her hand surgeon who is managing at this time and have better understanding of her prognosis to complete her FMLA.  Reached out to patient's PCP to discuss visit today and deferred discretion to complete the FMLA form to the PCP.

## 2023-01-02 ENCOUNTER — Ambulatory Visit: Payer: No Typology Code available for payment source

## 2023-01-02 VITALS — BP 137/80 | HR 92 | Ht 65.0 in | Wt 327.0 lb

## 2023-01-02 DIAGNOSIS — R051 Acute cough: Secondary | ICD-10-CM | POA: Diagnosis not present

## 2023-01-02 LAB — POC SOFIA SARS ANTIGEN FIA: SARS Coronavirus 2 Ag: NEGATIVE

## 2023-01-02 NOTE — Progress Notes (Signed)
    SUBJECTIVE:   CHIEF COMPLAINT / HPI:   Congestion, cough, shortness of breath Started 2 to 3 days ago.  Reports she felt hot but no documented fever.  Mild decrease in appetite but states she is still drinking fluids and staying hydrated. 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 Used albuterol yesterday, 2 puffs without much symptom improvement. Using Flonase 2 sprays in each nostril daily.  Shortness of breath only occurs after speaking for long periods of time at work.  PERTINENT  PMH / PSH: T2DM  OBJECTIVE:   BP 137/80   Pulse 92   Ht 5\' 5"  (1.651 m)   Wt (!) 327 lb (148.3 kg)   SpO2 97%   BMI 54.42 kg/m    General: NAD, pleasant, able to participate in exam HEENT: Moist mucous membranes, TM clear bilaterally, nonerythematous pharynx, no rhinorrhea noted.  No palpable cervical lymphadenopathy. Cardiac: RRR, no murmurs. Respiratory: CTAB, normal effort, No wheezes, rales or rhonchi.  Intermittent deep cough Extremities: no edema or cyanosis. Skin: warm and dry, no rashes noted Neuro: alert, no obvious focal deficits Psych: Normal affect and mood  ASSESSMENT/PLAN:   Cough Was likely secondary to viral URI. COVID testing in office negative. SOB only at work after speaking for long periods of time, likely due to upper airway congestion. -Advised continue supportive care with Mucinex, Tylenol and throat lozenges. Can use albuterol as needed. -Provided work note for accommodation. Patient asked about FMLA paperwork, forwarded request to PCP   Dr. Elberta Fortis, DO [facility]

## 2023-01-02 NOTE — Patient Instructions (Addendum)
It was wonderful to see you today! Thank you for choosing [facility].   Please bring ALL of your medications with you to every visit.   Today we talked about:  I am sorry you are feeling better!  We are testing you for COVID today in the office, I will follow-up with you about those results.  If you are positive I can send in treatment as we discussed with Paxlovid.  This would be twice a day treatment for 5 days.  I would recommend Mucinex every 12 hours (or guaifenesin is the generic name can take every 4 hours) but please read the bottle for duration of effect.  Can use Tylenol for sore throat and a small amount of honey with warm fluids.  Please continue use of Flonase 2 sprays in each nostril as well.  You can use the albuterol 1 to 2 puffs every 4 hours as needed but this is most likely an upper airway virus that is causing her shortness of breath. If you develop persistent fevers or worsening shortness of breath please return to care.  Please follow up as needed for persistent symptoms   We are checking some labs today. If they are abnormal, I will call you. If they are normal, I will send you a MyChart message (if it is active) or a letter in the mail. If you do not hear about your labs in the next 2 weeks, please call the office.  Call the clinic at [phone] if your symptoms worsen or you have any concerns.  Please be sure to schedule follow up at the front desk before you leave today.   Elberta Fortis, DO Family Medicine

## 2023-01-02 NOTE — Assessment & Plan Note (Addendum)
Was likely secondary to viral URI. COVID testing in office negative. SOB only at work after speaking for long periods of time, likely due to upper airway congestion. -Advised continue supportive care with Mucinex, Tylenol and throat lozenges. Can use albuterol as needed. -Provided work note for accommodation. Patient asked about FMLA paperwork, forwarded request to PCP

## 2023-01-08 ENCOUNTER — Encounter: Payer: Self-pay | Admitting: Family Medicine

## 2023-01-08 ENCOUNTER — Telehealth: Payer: Self-pay | Admitting: Family Medicine

## 2023-01-08 NOTE — Telephone Encounter (Signed)
Patient asked if I would send a message back asking doctor to call her due to the appointment not being until 10/4. Patient stated she is needing to speak to the doctor sooner then 10/4 about FMLA.   Please Advise.   Thanks!

## 2023-01-16 ENCOUNTER — Other Ambulatory Visit: Payer: Self-pay | Admitting: *Deleted

## 2023-01-16 MED ORDER — DAPAGLIFLOZIN PROPANEDIOL 10 MG PO TABS: 10.0000 mg | ORAL_TABLET | Freq: Every day | ORAL | 0 refills | Status: DC

## 2023-01-19 ENCOUNTER — Ambulatory Visit: Payer: No Typology Code available for payment source | Admitting: Pharmacist

## 2023-01-19 ENCOUNTER — Ambulatory Visit: Payer: No Typology Code available for payment source | Admitting: Family Medicine

## 2023-04-26 ENCOUNTER — Other Ambulatory Visit: Payer: Self-pay | Admitting: Family Medicine

## 2023-05-10 ENCOUNTER — Ambulatory Visit (INDEPENDENT_AMBULATORY_CARE_PROVIDER_SITE_OTHER): Payer: No Typology Code available for payment source | Admitting: Student

## 2023-05-10 ENCOUNTER — Encounter: Payer: Self-pay | Admitting: Student

## 2023-05-10 VITALS — BP 151/94 | HR 101 | Ht 65.0 in | Wt 330.4 lb

## 2023-05-10 DIAGNOSIS — L282 Other prurigo: Secondary | ICD-10-CM | POA: Diagnosis not present

## 2023-05-10 MED ORDER — TRIAMCINOLONE ACETONIDE 0.1 % EX OINT
1.0000 | TOPICAL_OINTMENT | Freq: Two times a day (BID) | CUTANEOUS | 1 refills | Status: DC
Start: 2023-05-10 — End: 2023-11-30

## 2023-05-10 NOTE — Patient Instructions (Addendum)
Holly [name],  It is so lovely to meet you. I think the itchy spots on your legs are from a condition called "papular urticaria" which is a type of allergic reaction most often brought on by a bug bite or similar exposure. The best treatment is to take Zyrtec EVERY day and to use topical steroids. I am sending in some ointment for you, you can use this twice daily for itch but do not use it for more than two weeks at a time.   If, however, this gets worse instead of better, please come back to see [place]!   [name], MD

## 2023-05-12 NOTE — Progress Notes (Signed)
    SUBJECTIVE:   CHIEF COMPLAINT / HPI:   Painful Bug Bite Patient is here today with a painful papule on the back of her right leg.  She tells me that this started as a bug bite and was itchy but has since become painful.  She does get these when she has bug bites.  She notes residual scars from prior similar eruptions, 1 on the right leg and 1 on the left leg.  She is not sure what sort of bug this came from, but she does have a suspicion that her dog may have fleas.  She denies any fevers or systemic symptoms.  No rashes associated with the symptoms.  PERTINENT  PMH / PSH: Asthma, Eczema, Seasonal Allergies   OBJECTIVE:   BP (!) 151/94   Pulse (!) 101   Ht 5\' 5"  (1.651 m)   Wt (!) 330 lb 6.4 oz (149.9 kg)   SpO2 98%   BMI 54.98 kg/m   Gen: well-appearing and NAD  HEENT: normocephalic, atraumatic, EOM grossly intact, oral mucosa moist, neck supple Respiratory: normal respiratory effort GI: non-distended Skin: papular lesion to R ankle as below. No erythema, warmth, or fluctuance to suggest abscess formation. Also note hyperpigmented round lesions to the bilateral ankles consistent with prior lesions.         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  She is certainly at increased risk of this given her atopic triad of asthma, eczema, allergies.  Advised daily use of cetirizine (already on her med list but not taking) and topical 0.1% triamcinolone ointment.  Return precautions reviewed for lack of improvement or spreading of the lesions.   [name], MD [facility]

## 2023-05-20 ENCOUNTER — Other Ambulatory Visit: Payer: Self-pay

## 2023-05-20 ENCOUNTER — Emergency Department (HOSPITAL_COMMUNITY)
Admission: EM | Admit: 2023-05-20 | Discharge: 2023-05-21 | Disposition: A | Discharge status: Home / Self Care | Payer: Commercial Managed Care - HMO | Attending: Student | Admitting: Student

## 2023-05-20 ENCOUNTER — Emergency Department (HOSPITAL_COMMUNITY): Payer: Commercial Managed Care - HMO

## 2023-05-20 ENCOUNTER — Encounter (HOSPITAL_COMMUNITY): Payer: Self-pay

## 2023-05-20 DIAGNOSIS — D72829 Elevated white blood cell count, unspecified: Secondary | ICD-10-CM | POA: Diagnosis not present

## 2023-05-20 DIAGNOSIS — M79661 Pain in right lower leg: Secondary | ICD-10-CM | POA: Diagnosis not present

## 2023-05-20 DIAGNOSIS — M7989 Other specified soft tissue disorders: Secondary | ICD-10-CM | POA: Diagnosis not present

## 2023-05-20 DIAGNOSIS — M79662 Pain in left lower leg: Secondary | ICD-10-CM | POA: Diagnosis not present

## 2023-05-20 DIAGNOSIS — M7732 Calcaneal spur, left foot: Secondary | ICD-10-CM | POA: Diagnosis not present

## 2023-05-20 DIAGNOSIS — M25572 Pain in left ankle and joints of left foot: Secondary | ICD-10-CM | POA: Diagnosis not present

## 2023-05-20 DIAGNOSIS — L03119 Cellulitis of unspecified part of limb: Secondary | ICD-10-CM

## 2023-05-20 DIAGNOSIS — M7731 Calcaneal spur, right foot: Secondary | ICD-10-CM | POA: Diagnosis not present

## 2023-05-20 DIAGNOSIS — R944 Abnormal results of kidney function studies: Secondary | ICD-10-CM | POA: Insufficient documentation

## 2023-05-20 DIAGNOSIS — R600 Localized edema: Secondary | ICD-10-CM | POA: Diagnosis not present

## 2023-05-20 DIAGNOSIS — M79606 Pain in leg, unspecified: Secondary | ICD-10-CM | POA: Diagnosis present

## 2023-05-20 DIAGNOSIS — L03115 Cellulitis of right lower limb: Secondary | ICD-10-CM | POA: Insufficient documentation

## 2023-05-20 DIAGNOSIS — M25571 Pain in right ankle and joints of right foot: Secondary | ICD-10-CM | POA: Diagnosis not present

## 2023-05-20 DIAGNOSIS — L03116 Cellulitis of left lower limb: Secondary | ICD-10-CM | POA: Diagnosis not present

## 2023-05-20 LAB — SEDIMENTATION RATE: Sed Rate: 31 mm/h — ABNORMAL HIGH (ref 0–22)

## 2023-05-20 LAB — COMPREHENSIVE METABOLIC PANEL
ALT: 27 U/L (ref 0–44)
AST: 22 U/L (ref 15–41)
Albumin: 4.1 g/dL (ref 3.5–5.0)
Alkaline Phosphatase: 82 U/L (ref 38–126)
Anion gap: 15 (ref 5–15)
BUN: 10 mg/dL (ref 6–20)
CO2: 24 mmol/L (ref 22–32)
Calcium: 9.2 mg/dL (ref 8.9–10.3)
Chloride: 100 mmol/L (ref 98–111)
Creatinine, Ser: 0.43 mg/dL — ABNORMAL LOW (ref 0.44–1.00)
GFR, Estimated: >60 mL/min (ref >60)
Glucose, Bld: 150 mg/dL — ABNORMAL HIGH (ref 70–99)
Potassium: 3.9 mmol/L (ref 3.5–5.1)
Sodium: 139 mmol/L (ref 135–145)
Total Bilirubin: 1.2 mg/dL (ref 0.0–1.2)
Total Protein: 8.2 g/dL — ABNORMAL HIGH (ref 6.5–8.1)

## 2023-05-20 LAB — CBC WITH DIFFERENTIAL/PLATELET
Abs Immature Granulocytes: 0.04 10*3/uL (ref 0.00–0.07)
Basophils Absolute: 0.1 10*3/uL (ref 0.0–0.1)
Basophils Relative: 0 %
Eosinophils Absolute: 0.2 10*3/uL (ref 0.0–0.5)
Eosinophils Relative: 1 %
HCT: 48.5 % — ABNORMAL HIGH (ref 36.0–46.0)
Hemoglobin: 15 g/dL (ref 12.0–15.0)
Immature Granulocytes: 0 %
Lymphocytes Relative: 22 %
Lymphs Abs: 3.4 10*3/uL (ref 0.7–4.0)
MCH: 26.8 pg (ref 26.0–34.0)
MCHC: 30.9 g/dL (ref 30.0–36.0)
MCV: 86.8 fL (ref 80.0–100.0)
Monocytes Absolute: 0.7 10*3/uL (ref 0.1–1.0)
Monocytes Relative: 5 %
Neutro Abs: 10.9 10*3/uL — ABNORMAL HIGH (ref 1.7–7.7)
Neutrophils Relative %: 72 %
Platelets: 491 10*3/uL — ABNORMAL HIGH (ref 150–400)
RBC: 5.59 MIL/uL — ABNORMAL HIGH (ref 3.87–5.11)
RDW: 14.4 % (ref 11.5–15.5)
WBC: 15.3 10*3/uL — ABNORMAL HIGH (ref 4.0–10.5)
nRBC: 0 % (ref 0.0–0.2)

## 2023-05-20 LAB — URINALYSIS, ROUTINE W REFLEX MICROSCOPIC
Bacteria, UA: NONE SEEN
Bilirubin Urine: NEGATIVE
Glucose, UA: >=500 mg/dL — AB
Ketones, ur: 20 mg/dL — AB
Leukocytes,Ua: NEGATIVE
Nitrite: NEGATIVE
Protein, ur: 30 mg/dL — AB
Specific Gravity, Urine: 1.038 — ABNORMAL HIGH (ref 1.005–1.030)
pH: 5 (ref 5.0–8.0)

## 2023-05-20 LAB — LACTIC ACID, PLASMA: Lactic Acid, Venous: 1.8 mmol/L (ref 0.5–1.9)

## 2023-05-20 LAB — HCG, SERUM, QUALITATIVE: Preg, Serum: NEGATIVE

## 2023-05-20 MED ORDER — SODIUM CHLORIDE 0.9 % IV SOLN
1.0000 g | Freq: Once | INTRAVENOUS | Status: AC
  Administered 2023-05-20: 1 g via INTRAVENOUS
  Filled 2023-05-20: qty 10

## 2023-05-20 MED ORDER — MORPHINE SULFATE (PF) 2 MG/ML IV SOLN
2.0000 mg | Freq: Once | INTRAVENOUS | Status: AC
  Administered 2023-05-20: 2 mg via INTRAMUSCULAR
  Filled 2023-05-20: qty 1

## 2023-05-20 MED ORDER — MORPHINE SULFATE (PF) 2 MG/ML IV SOLN
2.0000 mg | Freq: Once | INTRAVENOUS | Status: AC
  Administered 2023-05-20: 2 mg via INTRAVENOUS
  Filled 2023-05-20: qty 1

## 2023-05-20 MED ORDER — GADOBUTROL 1 MMOL/ML IV SOLN
10.0000 mL | Freq: Once | INTRAVENOUS | Status: AC | PRN
  Administered 2023-05-20: 10 mL via INTRAVENOUS

## 2023-05-20 NOTE — ED Provider Notes (Signed)
  Physical Exam  BP 131/80   Pulse 85   Temp 98.2 F (36.8 C) (Oral)   Resp 18   Ht 5\' 5"  (1.651 m)   Wt (!) 149.9 kg   SpO2 94%   BMI 54.98 kg/m   Physical Exam  Procedures  Procedures  ED Course / MDM    Medical Decision Making Amount and/or Complexity of Data Reviewed Labs: ordered. Radiology: ordered.  Risk Prescription drug management.   Flea bites 2 weeks ago - triamcinolone Worsening sores with secondary infection Rocephin here Pending MRI r/o osteo (right tib/fib) Morphine x 2 with improvement  Anticipate d/ch - Doxycycline Rx  MRI - negative for osseous abnormality, c/w cellulitis changes.   Can discharge per plan of previous treatment team.        Elpidio Anis, PA-C 05/21/23 0130    Kommor, Wyn Forster, MD 05/21/23 1235

## 2023-05-20 NOTE — ED Notes (Signed)
Pt is asking for pain medication. [place], [place] notified

## 2023-05-20 NOTE — ED Triage Notes (Signed)
Patient reports she has infected bug bites x "a couple weeks." Patient saw PCP and was given abx ointment. Has been using it, but has had now improvement. Patient reports increased pain. Denies fevers.

## 2023-05-20 NOTE — ED Notes (Signed)
Pt requesting medication for pain. [place], [place] notified

## 2023-05-20 NOTE — ED Provider Notes (Signed)
[place] EMERGENCY DEPARTMENT AT [facility] Provider Note   CSN: [id number] Arrival date & time: 05/20/23  1327     History {Add pertinent medical, surgical, social history, OB history to HPI:1} Chief Complaint  Patient presents with   Wound Check    Holly [name] is a 43 y.o. female.   Wound Check  Pt presents to the ED with pain in legs bilaterally with R leg being more painful, described as intermittent, "sharp shooting pain." Has interfered with sleep.  Endorses bilateral lower extremity swelling and increased pain starting 2 days ago.  Currently taking cetrizine and steroid cream w/out relief.   Denies fever, cough, congestion, headache, vision changes, chest pain, shortness of breath, abdominal pain, dysuria, diarrhea, nausea, vomiting, lower extremity numbness.    Home Medications Prior to Admission medications   Medication Sig Start Date End Date Taking? Authorizing Provider  Accu-Chek Softclix Lancets lancets Use daily 08/05/20   Sabino Dick, DO  albuterol (VENTOLIN HFA) 108 (90 Base) MCG/ACT inhaler Inhale 2 puffs into the lungs every 6 (six) hours as needed for wheezing or shortness of breath. 07/15/21   Moses Manners, MD  amLODipine (NORVASC) 10 MG tablet TAKE 1 TABLET BY MOUTH EVERY DAY 06/07/22   Dameron, Nolberto Hanlon, DO  atorvastatin (LIPITOR) 40 MG tablet Take 1 tablet (40 mg total) by mouth daily. 12/15/22   McDiarmid, Leighton Roach, MD  Blood Glucose Monitoring Suppl (ACCU-CHEK GUIDE) w/Device KIT Check blood sugar daily. 08/05/20   Sabino Dick, DO  cetirizine (ZYRTEC) 10 MG tablet TAKE 1 TABLET BY MOUTH EVERY DAY Patient not taking: Reported on 12/15/2022 01/09/22   Sabino Dick, DO  Continuous Glucose Sensor (FREESTYLE LIBRE 3 SENSOR) MISC 1 Device by Does not apply route every 14 (fourteen) days. Place 1 sensor on the skin every 14 days. Use to check glucose continuously Patient not taking: Reported on 12/15/2022 10/13/22   McDiarmid,  Leighton Roach, MD  diclofenac Sodium (VOLTAREN) 1 % GEL APPLY 2 GRAMS TO AFFECTED AREA 4 TIMES A DAY 10/10/22   Sabino Dick, DO  Dulaglutide 3 MG/0.5ML SOPN Inject 3 mg into the skin once a week. 12/15/22   McDiarmid, Leighton Roach, MD  FARXIGA 10 MG TABS tablet TAKE 1 TABLET BY MOUTH EVERY DAY 04/27/23   [name], MD  fluticasone (FLONASE) 50 MCG/ACT nasal spray Place 2 sprays into both nostrils daily. 05/17/16   Arvilla Market, MD  glucose blood (ACCU-CHEK GUIDE) test strip Use as instructed 08/05/20   Sabino Dick, DO  ibuprofen (ADVIL) 800 MG tablet Take 1 tablet (800 mg total) by mouth every 8 (eight) hours as needed. Patient not taking: Reported on 05/16/2022 05/05/21   Shirlean Mylar, MD  Insulin Glargine [facility]) 100 UNIT/ML Inject 40 Units into the skin daily. Inject 40 Units into the skin daily. 12/19/22   [name], MD  insulin lispro (HUMALOG KWIKPEN) 200 UNIT/ML KwikPen Inject 8 Units into the skin daily before supper. Patient taking differently: Inject 5 Units into the skin 2 (two) times daily before a meal. 10/13/22   McDiarmid, Leighton Roach, MD  Insulin Pen Needle (BD PEN NEEDLE NANO 2ND GEN) 32G X 4 MM MISC Inject 1 Container into the skin in the morning, at noon, and at bedtime. 12/15/22   McDiarmid, Leighton Roach, MD  Lancets Misc. (ACCU-CHEK SOFTCLIX LANCET DEV) KIT 1 Device by Does not apply route daily. 08/05/20   Sabino Dick, DO  losartan-hydrochlorothiazide (HYZAAR) 50-12.5 MG tablet TAKE 1 TABLET BY  MOUTH EVERY DAY 03/07/22   Lilland, Alana, DO  metFORMIN (GLUCOPHAGE-XR) 500 MG 24 hr tablet Take 2 tablets (1,000 mg total) by mouth daily. 10/13/22   McDiarmid, Leighton Roach, MD  triamcinolone ointment (KENALOG) 0.1 % Apply 1 Application topically 2 (two) times daily. Do not use for more than two weeks at a time 05/10/23   Alicia Amel, MD      Allergies    Naproxen sodium    Review of Systems   Review of Systems  Physical Exam Updated Vital Signs BP (!)  153/102   Pulse 100   Temp 98.2 F (36.8 C) (Oral)   Resp 16   Ht 5\' 5"  (1.651 m)   Wt (!) 149.9 kg   SpO2 97%   BMI 54.98 kg/m  Physical Exam Vitals and nursing note reviewed.  Constitutional:      General: She is not in acute distress.    Appearance: Normal appearance. She is not ill-appearing.  HENT:     Head: Normocephalic and atraumatic.  Eyes:     Extraocular Movements: Extraocular movements intact.     Conjunctiva/sclera: Conjunctivae normal.  Cardiovascular:     Rate and Rhythm: Normal rate and regular rhythm.     Pulses: Normal pulses.     Heart sounds: Normal heart sounds. No murmur heard.    No friction rub. No gallop.     Comments: DP pulses present bilaterally Pulmonary:     Effort: Pulmonary effort is normal. No respiratory distress.     Breath sounds: Normal breath sounds. No stridor. No wheezing, rhonchi or rales.  Abdominal:     General: Abdomen is flat.     Palpations: Abdomen is soft.     Tenderness: There is no abdominal tenderness.  Skin:    General: Skin is warm and dry.     Coloration: Skin is not jaundiced or pale.     Findings: Erythema, lesion and rash present.  Neurological:     General: No focal deficit present.     Mental Status: She is alert. Mental status is at baseline.     Sensory: No sensory deficit.  Psychiatric:        Mood and Affect: Mood normal.        ED Results / Procedures / Treatments   Labs (all labs ordered are listed, but only abnormal results are displayed) Labs Reviewed - No data to display  EKG None  Radiology No results found.  Procedures Procedures  {Document cardiac monitor, telemetry assessment procedure when appropriate:1}  Medications Ordered in ED Medications - No data to display  ED Course/ Medical Decision Making/ A&P   {   Click here for ABCD2, HEART and other calculatorsREFRESH Note before signing :1}                              Medical Decision Making  This patient is a 43 year old  female who presents to the ED for concern of bug bites present for 2 weeks, previously seen by PCP and given antibiotic ointment.   Differential diagnoses prior to evaluation: The emergent differential diagnosis includes, but is not limited to, cellulitis, dermatitis, abscess, osteomyelitis, necrotizing fasciitis, PAD, venous stasis ulcer. This is not an exhaustive differential.   Past Medical History / Co-morbidities / Social History: Status postcholecystectomy, asthma, diabetes, eczema, HLD, abscesses, paronychia.  Additional history: Chart reviewed. Pertinent results include:   Patient previously seen on 1/23/125 for possible  papular urticaria secondary to bug bites.  Was prescribed Zyrtec and topical steroids.   Lab Tests/Imaging studies: I personally interpreted labs/imaging and the pertinent results include:    . ***I agree with the radiologist interpretation.  Cardiac monitoring: EKG obtained and interpreted by myself and attending physician which shows: ***   Medications: I ordered medication including ***.  I have reviewed the patients home medicines and have made adjustments as needed.   ED Course: Patient is a 43 year old female to the ED today complaining of a 2-day history of increased leg swelling and pain after receiving bug bites 2 weeks ago.  She believes these are due to fleas from her dog which she has since treated.  However wounds have progressively gotten worse despite being seen by PCP on 05/10/2023 where she was prescribed triamcinolone cream and told to take Zyrtec.  She has been trying the cream and taking cetirizine not relief.  She now states that she has difficulty even walking on her legs due to the pain and that they now feel hot to the touch.  On physical exam multiple lesions are noted.  Pictures are in the chart.     Disposition: After consideration of the diagnostic results and the patients response to treatment, I feel that *** .   ***emergency  department workup does not suggest an emergent condition requiring admission or immediate intervention beyond what has been performed at this time. The plan is: ***. The patient is safe for discharge and has been instructed to return immediately for worsening symptoms, change in symptoms or any other concerns.   {Document critical care time when appropriate:1} {Document review of labs and clinical decision tools ie heart score, Chads2Vasc2 etc:1}  {Document your independent review of radiology images, and any outside records:1} {Document your discussion with family members, caretakers, and with consultants:1} {Document social determinants of health affecting pt's care:1} {Document your decision making why or why not admission, treatments were needed:1} Final Clinical Impression(s) / ED Diagnoses Final diagnoses:  None    Rx / DC Orders ED Discharge Orders     None

## 2023-05-21 LAB — C-REACTIVE PROTEIN: CRP: 4.3 mg/dL — ABNORMAL HIGH (ref <1.0)

## 2023-05-21 MED ORDER — DOXYCYCLINE HYCLATE 100 MG PO CAPS: 100.0000 mg | ORAL_CAPSULE | Freq: Two times a day (BID) | ORAL | 0 refills | Status: DC

## 2023-05-21 MED ORDER — HYDROCODONE-ACETAMINOPHEN 5-325 MG PO TABS: 1.0000 | ORAL_TABLET | Freq: Four times a day (QID) | ORAL | 0 refills | Status: AC | PRN

## 2023-05-21 NOTE — Discharge Instructions (Signed)
Take the antibiotic as prescribed. You can take Norco for pain as needed.   Please plan to follow up with your doctor for recheck in 2-3 days. Return to the ED with any worsening symptoms or new concern.

## 2023-05-28 ENCOUNTER — Other Ambulatory Visit: Payer: Self-pay

## 2023-05-28 DIAGNOSIS — I10 Essential (primary) hypertension: Secondary | ICD-10-CM

## 2023-05-28 MED ORDER — LOSARTAN POTASSIUM-HCTZ 50-12.5 MG PO TABS: 1.0000 | ORAL_TABLET | Freq: Every day | ORAL | 3 refills | Status: DC

## 2023-05-29 ENCOUNTER — Ambulatory Visit (INDEPENDENT_AMBULATORY_CARE_PROVIDER_SITE_OTHER): Payer: No Typology Code available for payment source | Admitting: Student

## 2023-05-29 VITALS — BP 114/95 | HR 98 | Ht 65.0 in | Wt 321.6 lb

## 2023-05-29 DIAGNOSIS — Z794 Long term (current) use of insulin: Secondary | ICD-10-CM

## 2023-05-29 DIAGNOSIS — E119 Type 2 diabetes mellitus without complications: Secondary | ICD-10-CM | POA: Diagnosis not present

## 2023-05-29 DIAGNOSIS — R3129 Other microscopic hematuria: Secondary | ICD-10-CM | POA: Diagnosis not present

## 2023-05-29 DIAGNOSIS — L97909 Non-pressure chronic ulcer of unspecified part of unspecified lower leg with unspecified severity: Secondary | ICD-10-CM

## 2023-05-29 DIAGNOSIS — E1165 Type 2 diabetes mellitus with hyperglycemia: Secondary | ICD-10-CM | POA: Diagnosis not present

## 2023-05-29 DIAGNOSIS — I10 Essential (primary) hypertension: Secondary | ICD-10-CM | POA: Diagnosis not present

## 2023-05-29 LAB — POCT GLYCOSYLATED HEMOGLOBIN (HGB A1C): HbA1c, POC (controlled diabetic range): 9 % — AB (ref 0.0–7.0)

## 2023-05-29 NOTE — Patient Instructions (Addendum)
It was great to see you! Thank you for allowing me to participate in your care!  I recommend that you always bring your medications to each appointment as this makes it easy to ensure you are on the correct medications and helps [place] not miss when refills are needed.  Our plans for today:  - Schedule apt for next week for diabetes and hypertension - Keep dressing on until you follow up with nurse on Friday (schedule this on your way out)   Take care and seek immediate care sooner if you develop any concerns.   Dr. Erick Alley, DO [facility]

## 2023-05-29 NOTE — Progress Notes (Unsigned)
SUBJECTIVE:   CHIEF COMPLAINT / HPI:   Cellulitis, leg wounds Pt seen in ED 05/20/23 for leg pain in setting of ?insect bites (fleas?) which did not improve with triamcinolone and zyrtec prescribed previously at [facility]. In ED, labs significant for elevated CRP and SED, blood in urine, leukocytosis, elevated plts,  MRI ruled out osteo of R leg. She was treated for cellulitis with 1g CTX and rx doxy.  Today, states she has one more day of Doxy left. She states lesions have scabbed, R leg is still painful but better and there is yellow drainage on bandages.  Now with blistering lesions on L leg. No fever/chills. Otherwise states she feels well today.    PERTINENT  PMH / PSH: T2DM, HTN  OBJECTIVE:   BP (!) 114/95   Pulse 98   Ht 5\' 5"  (1.651 m)   Wt (!) 321 lb 9.6 oz (145.9 kg)   SpO2 98%   BMI 53.52 kg/m    General: NAD, pleasant, able to participate in exam Cardiac: RRR, no murmurs. Respiratory: CTAB, normal effort, No wheezes, rales or rhonchi Abdomen: Bowel sounds present, nontender, nondistended, no hepatosplenomegaly. Extremities: no edema or cyanosis, cap refill <2 sec of bilateral great toes, posterior tibial pulses palpated, warm feet  Skin: ulcerated wounds of lateral RLE near ankle with small amt serosanguinous drainage on bandage. Fluid filled bullae on lateral LLE near ankle, also with dried scabbed wound on lateral LLE. No purulent drainage or surrounding warmth, edema or erythema of any wounds on bilateral legs. See photo. Neuro: alert, no obvious focal deficits Psych: Normal affect and mood      ASSESSMENT/PLAN:   Microscopic hematuria Microscopic hematuria present in ED, also with protein in urine. Reassuringly, cr and GFR non concerning for AKI. Not discussed today, may have been on her period. Will plan to discuss further at upcoming visit on 06/05/23. If she was not on her period during that time, will plan for repeat UA and work up as indicated. Low concern  for malignancy given age and no smoking hx.   Diabetes mellitus without complication (HCC) Uncontrolled with A1C of 9 but down from 11.5 12/2022. Apt made 06/05/23 to further discuss and adjust medications.  Ulcer of lower extremity (HCC) Based on how lesions have progressed, less likely d/t insect bites which became infected. I am concerned for more systemic issue thought she reports no other symptoms at this time. Considered venous stasis but would be unlikely to be so acute, arterial ulcers but would expect diminished pulses/cold toes, vasculitis but would expect other systemic symptoms. Could be pyoderma granrenosum which can progress from bumps to blisters to ulcers.  Previous cellulitis has resolved with doxycycline. Current ulcerated wounds do not appear infected and she has no systemic symptoms. I am concerned about wound healing given uncontrolled T2DM. -referral for out pt wound care center -Duo derm dressing applied to R leg with ulcerated wounds, absorbent dressing applied to L leg with bullae -f/u apt made in [facility] for 2/14 for dressing changes and possibly biopsy for more definitive diagnosis -can consider tx with steroids -apt made with me 2/18 for recheck and discuss/augment T2DM medications   HTN (hypertension) Uncontrolled but improved on repeat with DBP still elevated to 95 but SBP only 114. Did take BP meds today. Will CTM for now while having acute illness.       Dr. Erick Alley, DO [facility]    {    This will  disappear when note is signed, click to select method of visit    :1}

## 2023-05-30 ENCOUNTER — Encounter: Payer: Self-pay | Admitting: Student

## 2023-05-30 DIAGNOSIS — L97909 Non-pressure chronic ulcer of unspecified part of unspecified lower leg with unspecified severity: Secondary | ICD-10-CM | POA: Insufficient documentation

## 2023-05-30 DIAGNOSIS — R3129 Other microscopic hematuria: Secondary | ICD-10-CM | POA: Insufficient documentation

## 2023-05-30 NOTE — Assessment & Plan Note (Signed)
Uncontrolled with A1C of 9 but down from 11.5 12/2022. Apt made 06/05/23 to further discuss and adjust medications.

## 2023-05-30 NOTE — Assessment & Plan Note (Signed)
Microscopic hematuria present in ED, also with protein in urine. Reassuringly, cr and GFR non concerning for AKI. Not discussed today, may have been on her period. Will plan to discuss further at upcoming visit on 06/05/23. If she was not on her period during that time, will plan for repeat UA and work up as indicated. Low concern for malignancy given age and no smoking hx.

## 2023-05-30 NOTE — Assessment & Plan Note (Signed)
Uncontrolled but improved on repeat with DBP still elevated to 95 but SBP only 114. Did take BP meds today. Will CTM for now while having acute illness.

## 2023-05-30 NOTE — Assessment & Plan Note (Signed)
>>  ASSESSMENT AND PLAN FOR DIABETES MELLITUS WITHOUT COMPLICATION (HCC) WRITTEN ON 05/30/2023  5:47 AM BY JOSHUA, SARAH, DO  Uncontrolled with A1C of 9 but down from 11.5 12/2022. Apt made 06/05/23 to further discuss and adjust medications.

## 2023-05-30 NOTE — Assessment & Plan Note (Addendum)
Based on how lesions have progressed, less likely d/t insect bites which became infected. I am concerned for more systemic issue thought she reports no other symptoms at this time. Considered venous stasis but would be unlikely to be so acute, arterial ulcers but would expect diminished pulses/cold toes, vasculitis but would expect other systemic symptoms. Could be pyoderma granrenosum which can progress from bumps to blisters to ulcers.  Previous cellulitis has resolved with doxycycline. Current ulcerated wounds do not appear infected and she has no systemic symptoms. I am concerned about wound healing given uncontrolled T2DM. -referral for out pt wound care center -Duo derm dressing applied to R leg with ulcerated wounds, absorbent dressing applied to L leg with bullae -f/u apt made in [facility] for 2/14 for dressing changes and possibly biopsy for more definitive diagnosis -can consider tx with steroids -apt made with me 2/18 for recheck and discuss/augment T2DM medications

## 2023-06-01 ENCOUNTER — Other Ambulatory Visit: Payer: Self-pay

## 2023-06-01 ENCOUNTER — Ambulatory Visit (INDEPENDENT_AMBULATORY_CARE_PROVIDER_SITE_OTHER): Payer: No Typology Code available for payment source | Admitting: Student

## 2023-06-01 VITALS — BP 124/80 | HR 94 | Ht 65.0 in | Wt 326.0 lb

## 2023-06-01 DIAGNOSIS — M25473 Effusion, unspecified ankle: Secondary | ICD-10-CM

## 2023-06-01 DIAGNOSIS — Z23 Encounter for immunization: Secondary | ICD-10-CM | POA: Diagnosis not present

## 2023-06-01 DIAGNOSIS — L97901 Non-pressure chronic ulcer of unspecified part of unspecified lower leg limited to breakdown of skin: Secondary | ICD-10-CM | POA: Diagnosis not present

## 2023-06-01 MED ORDER — DOXYCYCLINE HYCLATE 100 MG PO CAPS: 100.0000 mg | ORAL_CAPSULE | Freq: Two times a day (BID) | ORAL | 0 refills | Status: AC

## 2023-06-01 MED ORDER — CEFADROXIL 500 MG PO CAPS: 500.0000 mg | ORAL_CAPSULE | Freq: Two times a day (BID) | ORAL | 0 refills | Status: AC

## 2023-06-01 NOTE — Patient Instructions (Addendum)
Ms. Mickelle, [place] to see you. Sorry this is getting worse instead of better. We obtained a biopsy which should help [place] more definitively make a diagnosis and get you on the right treatment.  I have ordered ultrasound testing of your legs to look at your blood flow to make sure you are getting sufficient blood to heal these wounds.  [place] [facility] will call you to schedule these.  Hopefully you will also hear back from the wound care clinic sooner rather than later to get you in with them.  In the meantime, we will see you back here about twice per week to stay on top of this.  Your biopsy results should take about 5 days to come back, I will call you once it is back. I am sending you on 2 different antibiotics for the next 5 days, doxycycline which she already took and cefadroxil which covers a different type of bacteria.  [name], MD

## 2023-06-01 NOTE — Progress Notes (Addendum)
    SUBJECTIVE:   CHIEF COMPLAINT / HPI:   BLE Wounds Initially seen by me on 05/10/2023 and diagnosed with papular urticaria based on raised lesions at that time.  Unfortunately these progressed to ulcerations and she was seen in the ER on 2/2 and started on doxycycline and then subsequently followed up with Dr. Yetta Barre in our clinic on 2/11 at which time the ulcers appeared to be improving.  She was dressed with DuoDERM and is back here today for a wound check. Unfortunately, it appears that while her left lower extremity wounds are healing, she is having progression of the ulcerations of the right lower extremity.  She feels that things started to get worse since she completed her course of doxycycline.  She denies any fevers or systemic symptoms. She was referred to wound care by Dr. Yetta Barre but has not yet heard from them re: making an appointment.    OBJECTIVE:   Ht 5\' 5"  (1.651 m)   Wt (!) 326 lb (147.9 kg)   BMI 54.25 kg/m   Gen: NAD Ext: LLE with healing ulcerations, these have scabbed over and are without active discharge.  RLE with more widespread ulcerations compared to prior exams, now with active yellow drainage.  There is chronic appearing pedal edema bilaterally and 1+ DP pulses bilaterally.    ASSESSMENT/PLAN:   Assessment & Plan Ulcer of lower extremity, limited to breakdown of skin, unspecified laterality (HCC) Bilateral. While the LLE appears to be healing, the RLE is worsening. I do wonder if this might represent pyoderma gangrenosum given initial trauma (bug bites) followed by progressive ulceration with drainage? Therefore obtained punch biopsy to assist in diagnosis and guiding further therapy. - START cefadroxil 500mg  BID and doxycycline 100mg  BID - After informed consent, a skin punch biopsy was obtained and sent for derm path - Have also ordered ABIs to evaluate for vascular disease contributing to poor wound healing - Wound care consult already placed by Dr.  Yetta Barre - Discussed the importance of excellent glycemic control in promoting wound healing  - Dressing change today, duoderm in place. Has RN visit next week with [place] for dressing change. Will need twice weekly RN visits with [place] until establishes with wound care.   Pre-op Diagnosis: Ulcerated skin lesion Post-op Diagnosis: Same Procedure: Punch Skin Biopsy Location: [facility] Performing Physician: [name], MD  Informed consent was obtained prior to the procedure. Time out performed. The area surrounding the skin lesion was prepared and cleaned with povidone-iodine swab stick and wiped clean with alcohol prep. The area was sufficiently anesthetized with 1% Lidocaine without epinephrine.  A size 4mm disposable biopsy punch tool was used to obtain tissue sample and placed in labeled biopsy cup. Silver nitrate was used to obtain hemostasis. The site was not closed. The patient tolerated the procedure well without complications.       [name], MD [facility]

## 2023-06-01 NOTE — Assessment & Plan Note (Addendum)
Bilateral. While the LLE appears to be healing, the RLE is worsening. I do wonder if this might represent pyoderma gangrenosum given initial trauma (bug bites) followed by progressive ulceration with drainage? Therefore obtained punch biopsy to assist in diagnosis and guiding further therapy. - START cefadroxil 500mg  BID and doxycycline 100mg  BID - After informed consent, a skin punch biopsy was obtained and sent for derm path - Have also ordered ABIs to evaluate for vascular disease contributing to poor wound healing - Wound care consult already placed by Dr. Yetta Barre - Discussed the importance of excellent glycemic control in promoting wound healing  - Dressing change today, duoderm in place. Has RN visit next week with [place] for dressing change. Will need twice weekly RN visits with [place] until establishes with wound care.   Pre-op Diagnosis: Ulcerated skin lesion Post-op Diagnosis: Same Procedure: Punch Skin Biopsy Location: [facility] Performing Physician: [name], MD  Informed consent was obtained prior to the procedure. Time out performed. The area surrounding the skin lesion was prepared and cleaned with povidone-iodine swab stick and wiped clean with alcohol prep. The area was sufficiently anesthetized with 1% Lidocaine without epinephrine.  A size 4mm disposable biopsy punch tool was used to obtain tissue sample and placed in labeled biopsy cup. Silver nitrate was used to obtain hemostasis. The site was not closed. The patient tolerated the procedure well without complications.

## 2023-06-05 ENCOUNTER — Ambulatory Visit (INDEPENDENT_AMBULATORY_CARE_PROVIDER_SITE_OTHER): Payer: No Typology Code available for payment source | Admitting: Student

## 2023-06-05 VITALS — BP 120/82 | HR 100 | Wt 322.0 lb

## 2023-06-05 DIAGNOSIS — L97909 Non-pressure chronic ulcer of unspecified part of unspecified lower leg with unspecified severity: Secondary | ICD-10-CM | POA: Diagnosis not present

## 2023-06-05 DIAGNOSIS — E119 Type 2 diabetes mellitus without complications: Secondary | ICD-10-CM

## 2023-06-05 DIAGNOSIS — Z794 Long term (current) use of insulin: Secondary | ICD-10-CM | POA: Diagnosis not present

## 2023-06-05 DIAGNOSIS — E1165 Type 2 diabetes mellitus with hyperglycemia: Secondary | ICD-10-CM

## 2023-06-05 LAB — DERMATOLOGY PATHOLOGY

## 2023-06-05 MED ORDER — DAPAGLIFLOZIN PROPANEDIOL 10 MG PO TABS: 10.0000 mg | ORAL_TABLET | Freq: Every day | ORAL | 3 refills | Status: DC

## 2023-06-05 NOTE — Progress Notes (Unsigned)
    SUBJECTIVE:   CHIEF COMPLAINT / HPI:   Leg Ulcers  Pt last seen at [facility] on 06/01/2023 by Dr. Marisue Humble and leg wounds biopsied (not yet back). She was also started on a course of Cefadroxil in addition to finishing doxycycline.  ABIs ordered at last visit but not yet scheduled.  Previously referred to wound clinic ***  T2DM Currently taking: basaglar 40 U daily Humalog 5 U before breakfast and lunch and 8 units before dinner.  Metformin 1000 mg daily  Dulaglutide 3 mg weekly  Prescribed farxiga but ran out 4 days ago.  A1c 05/29/23 of 9 Fasting BG: high 100's to low 200's Before meals: high 100's   PERTINENT  PMH / PSH: ***  OBJECTIVE:   BP 120/82   Pulse 100   Wt (!) 322 lb (146.1 kg)   SpO2 98%   BMI 53.58 kg/m  ***  General: NAD, pleasant, able to participate in exam Cardiac: RRR, no murmurs. Respiratory: CTAB, normal effort, No wheezes, rales or rhonchi Abdomen: Bowel sounds present, nontender, nondistended, no hepatosplenomegaly. Extremities: no edema or cyanosis. Skin: warm and dry, no rashes noted Neuro: alert, no obvious focal deficits Psych: Normal affect and mood  ASSESSMENT/PLAN:   No problem-specific Assessment & Plan notes found for this encounter.     Dr. Erick Alley, DO [facility]    {    This will disappear when note is signed, click to select method of visit    :1}

## 2023-06-05 NOTE — Patient Instructions (Addendum)
 It was great to see you! Thank you for allowing me to participate in your care!  I recommend that you always bring your medications to each appointment as this makes it easy to ensure you are on the correct medications and helps [place] not miss when refills are needed.  Our plans for today:  -Return on Friday and again early next week for nurse visit for bandage change -You will be contacted for scheduling in wound clinic -Urgent referral has been placed for dermatology, you will be contacted to schedule -For diabetes, we will increase your mealtime Humalog to 8 units 3 times daily before every meal. -Follow-up with me on Friday 2/28 -Will be in touch with scheduling appointment for the ankle brachial index to check blood flow    Take care and seek immediate care sooner if you develop any concerns.   Dr. Erick Alley, DO [facility]

## 2023-06-06 NOTE — Assessment & Plan Note (Signed)
>>  ASSESSMENT AND PLAN FOR DIABETES MELLITUS WITHOUT COMPLICATION (HCC) WRITTEN ON 06/06/2023  1:25 PM BY JONES, SARAH, DO  Uncontrolled with A1c of 9 earlier this month. -Increase Humalog  to 8 units with breakfast lunch and dinner -Continue Basaglar  40 units daily, metformin  1000 mg daily, dulaglutide  3 mg weekly, and Farxiga  10 mg daily refilled -Return in 1 week, can consider increasing basal insulin  if needed

## 2023-06-06 NOTE — Assessment & Plan Note (Signed)
 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  Low concern for infection at this time, no systemic symptoms and will not represcribe any antibiotics today.  May benefit from steroids however would like better glucose control.  Bandage changed and DuoDERM dressing reapplied today.  Will continue regular dressing changes and wound checks in the clinic until patient can get in with wound care. -Urgent dermatology referral today -f/u biopsy results -Will have RN schedule previously ordered ABIs though not sure patient will tolerate due to pain.  -RN wound check scheduled for this Friday and beginning of next week -Follow-up visit scheduled with me Friday of next week

## 2023-06-06 NOTE — Assessment & Plan Note (Signed)
 Uncontrolled with A1c of 9 earlier this month. -Increase Humalog to 8 units with breakfast lunch and dinner -Continue Basaglar 40 units daily, metformin 1000 mg daily, dulaglutide 3 mg weekly, and Farxiga 10 mg daily refilled -Return in 1 week, can consider increasing basal insulin if needed

## 2023-06-08 ENCOUNTER — Ambulatory Visit (INDEPENDENT_AMBULATORY_CARE_PROVIDER_SITE_OTHER): Payer: No Typology Code available for payment source | Admitting: Student

## 2023-06-08 VITALS — BP 120/84 | HR 92 | Wt 324.2 lb

## 2023-06-08 DIAGNOSIS — L97309 Non-pressure chronic ulcer of unspecified ankle with unspecified severity: Secondary | ICD-10-CM | POA: Diagnosis not present

## 2023-06-08 DIAGNOSIS — I83003 Varicose veins of unspecified lower extremity with ulcer of ankle: Secondary | ICD-10-CM

## 2023-06-08 NOTE — Patient Instructions (Addendum)
 I think we are moving in the right direction.  This does seem to be healing.  Unfortunately, not as quickly as I know you would like. We have outstanding referrals to both dermatology and wound care.  Hopefully one of them will be able to get you in sooner than April.  In the meantime, we will keep seeing you here twice a week and the nurse clinic until this resolves.  You are going to need compression on your lower extremities to prevent this from happening again.   [name], MD

## 2023-06-10 NOTE — Progress Notes (Cosign Needed Addendum)
    SUBJECTIVE:   CHIEF COMPLAINT / HPI:   Ulcerated Leg Wounds Follow-up Patient is here for follow-up of bilateral lower extremity ulcerated wounds.  Biopsy that I obtained last week returned showing slight epidermal hyperplasia with parakeratosis and prominent capillary hyperplasia in the superficial dermis.  These findings consistent with a stasis dermatitis.  She was ordered for ABIs but these have not yet been scheduled.  She has been maintaining her legs and compression dressings that our nurses are managing pending her getting set up with wound care.  She has also been augmenting this therapy with elevation at home.   OBJECTIVE:   BP 120/84   Pulse 92   Wt (!) 324 lb 3.2 oz (147.1 kg)   SpO2 96%   BMI 53.95 kg/m   Gen: Well appearing and NAD  Cardio: RRR Ext: Ulcerated wounds appear to be healing when compared to my previous exams. No purulent discharge or evidence of superinfection.  1+ DP pulses bilaterally        ASSESSMENT/PLAN:   Assessment & Plan Venous stasis ulcer of ankle, unspecified laterality, unspecified ulcer stage, unspecified whether varicose veins present (HCC) Ulcerated wounds do appear to be healing, albeit slowly.  Given the biopsy results suggestive of stasis dermatitis, it is all the more important that we get her vascular workup taking care of.  She is likely to need compressive therapy on an ongoing basis.  We did discuss this today. -ABI scheduled with the assistance of clinic staff -Wounds re-dressed with DuoDERM and compression dressing -Continue twice weekly wound care follow-up with our nursing clinic pending her getting set up with the wound care clinic on 4/3 - Dr. Yetta Barre placed referral to derm in case they can get her in sooner than wound care can     J Dorothyann Gibbs, MD [facility]

## 2023-06-12 ENCOUNTER — Ambulatory Visit (INDEPENDENT_AMBULATORY_CARE_PROVIDER_SITE_OTHER): Payer: No Typology Code available for payment source

## 2023-06-12 DIAGNOSIS — Z5189 Encounter for other specified aftercare: Secondary | ICD-10-CM

## 2023-06-12 NOTE — Progress Notes (Unsigned)
 Patient presents to nurse clinic for wound follow up. Wounds evaluated and picture obtained by Dr. Marisue Humble. Cleaned lower legs with soap and warm water. Duoderm applied to open wounds and legs wrapped with coban.   Once finished with wound care, patient stated that she has been having cough and congestion.   Coughing up yellow mucus, with occasional blood tinged sputum.   No current chest pain or SHOB.   Spoke with preceptor, agreed that patient needs to be evaluated by provider. Attempted to schedule patient for same day appointment, unfortunately, there was no availability. Advised that patient could wait and be worked in, however, was unsure of estimated time frame to see provider.   Patient already has scheduled follow up on Friday, 06/15/23. Patient elects to follow up at this time for this concern.   Discussed supportive measures and ED precautions.

## 2023-06-14 NOTE — Progress Notes (Signed)
    SUBJECTIVE:   CHIEF COMPLAINT / HPI:   Venous stasis ulcers Recent biopsy returned showing venous stasis is likely cause of ulcers.  Patient previously referred to dermatology and wound care.  Today states wound care appointment is not until April. Has not heard from derm.  No fever or chills.  Feeling like her normal self other than continued leg pain.  T2DM For past week Humalog has been increased to 8 units 3 times daily with meals and she is still taking Basaglar 40 units daily Fasting sugars - Mid 100's Preprandial sugars - Mid 100's No lows  PERTINENT  PMH / PSH: HTN, T2DM, obesity, HLD  OBJECTIVE:   BP 132/81   Pulse 88   Ht 5\' 5"  (1.651 m)   Wt (!) 320 lb 12.8 oz (145.5 kg)   SpO2 100%   BMI 53.38 kg/m    General: NAD, pleasant, able to participate in exam Cardiac: RRR, no murmurs. Respiratory: CTAB, normal effort, No wheezes, rales or rhonchi Extremities/skin: Ulcers of bilateral lateral lower extremities just above ankles and draining copious amounts serosanguineous fluid.  No erythema, edema or warmth surrounding wounds.  Cap refill great toes less than 2 seconds. see photos Neuro: alert, no obvious focal deficits Psych: Normal affect and mood      ASSESSMENT/PLAN:   Ulcer of lower extremity (HCC) Not much change from last week when comparing photos.  There is more drainage today.  No signs of infection. -DuoDERM/compression dressing changed today -Consider Unna boot's next week (apt on 3/4 w/ Dr. Marisue Humble)  -RN dressing change visit at end of week  -Continue to elevate at home -f/u ABIs -scheduled for 3/4 -Keep Wound clinic at in April -Patient given contact information for dermatology office  Diabetes mellitus without complication (HCC) Better control after increasing Humalog to 8 units 3 times daily with meals.  Will increase Basaglar by 2 units with goal of fasting sugars closer to ~130s -Continue Humalog 8 units 3 times daily with  meals -Increase Basaglar to 42 units daily    Dr. Erick Alley, DO [facility]    ,

## 2023-06-14 NOTE — Progress Notes (Signed)
    SUBJECTIVE:   CHIEF COMPLAINT / HPI:  Seen alongside RN Glori Bickers. Wounds to the bilateral ankles do appear to be healing, albeit slowly. Has not yet had her ABIs but these are scheduled for 06/19/23.  Also has wound care appt scheduled for 07/19/23. Has been referred to derm, but this appointment is not yet scheduled. - Wounds dressed with Duoderm and compressive dressing - Return on 2/28 to see Dr. Yetta Barre - ABI on 3/4 - Wound care and dermatology referrals as above  - Will continue to see our nurse clinic twice weekly for dressing changes until we can get her in with wound care        J Dorothyann Gibbs, MD [facility]

## 2023-06-15 ENCOUNTER — Encounter: Payer: Self-pay | Admitting: Student

## 2023-06-15 ENCOUNTER — Other Ambulatory Visit: Payer: Self-pay | Admitting: Student

## 2023-06-15 ENCOUNTER — Ambulatory Visit (INDEPENDENT_AMBULATORY_CARE_PROVIDER_SITE_OTHER): Payer: No Typology Code available for payment source | Admitting: Student

## 2023-06-15 VITALS — BP 132/81 | HR 88 | Ht 65.0 in | Wt 320.8 lb

## 2023-06-15 DIAGNOSIS — E119 Type 2 diabetes mellitus without complications: Secondary | ICD-10-CM

## 2023-06-15 DIAGNOSIS — L97909 Non-pressure chronic ulcer of unspecified part of unspecified lower leg with unspecified severity: Secondary | ICD-10-CM

## 2023-06-15 DIAGNOSIS — I10 Essential (primary) hypertension: Secondary | ICD-10-CM

## 2023-06-15 NOTE — Patient Instructions (Addendum)
 It was great to see you! Thank you for allowing me to participate in your care!  I recommend that you always bring your medications to each appointment as this makes it easy to ensure you are on the correct medications and helps [place] not miss when refills are needed.  Our plans for today:  [facility] [address] [place], [place] [address] [phone] - Increase Basaglar to 42 U daily and continue Humalog 8 Units with meals. Goal is to have fasting sugars ~130's -Return Tuesday to see Dr. Marisue Humble  - Make apt for RN visit Friday    Take care and seek immediate care sooner if you develop any concerns.   Dr. Erick Alley, DO [facility]

## 2023-06-16 ENCOUNTER — Encounter: Payer: Self-pay | Admitting: Student

## 2023-06-16 NOTE — Assessment & Plan Note (Signed)
>>  ASSESSMENT AND PLAN FOR DIABETES MELLITUS WITHOUT COMPLICATION (HCC) WRITTEN ON 06/16/2023  7:55 PM BY JOSHUA DOMINO, DO  Better control after increasing Humalog  to 8 units 3 times daily with meals.  Will increase Basaglar  by 2 units with goal of fasting sugars closer to ~130s -Continue Humalog  8 units 3 times daily with meals -Increase Basaglar  to 42 units daily

## 2023-06-16 NOTE — Assessment & Plan Note (Signed)
 Better control after increasing Humalog to 8 units 3 times daily with meals.  Will increase Basaglar by 2 units with goal of fasting sugars closer to ~130s -Continue Humalog 8 units 3 times daily with meals -Increase Basaglar to 42 units daily

## 2023-06-16 NOTE — Assessment & Plan Note (Addendum)
 Not much change from last week when comparing photos.  There is more drainage today.  No signs of infection. -DuoDERM/compression dressing changed today -Consider Unna boot's next week (apt on 3/4 w/ Dr. Marisue Humble)  -RN dressing change visit at end of week  -Continue to elevate at home -f/u ABIs -scheduled for 3/4 -Keep Wound clinic at in April -Patient given contact information for dermatology office

## 2023-06-19 ENCOUNTER — Encounter: Payer: Self-pay | Admitting: Student

## 2023-06-19 ENCOUNTER — Ambulatory Visit (INDEPENDENT_AMBULATORY_CARE_PROVIDER_SITE_OTHER): Payer: No Typology Code available for payment source | Admitting: Student

## 2023-06-19 ENCOUNTER — Other Ambulatory Visit: Payer: Self-pay | Admitting: Student

## 2023-06-19 ENCOUNTER — Ambulatory Visit (HOSPITAL_COMMUNITY)
Admission: RE | Admit: 2023-06-19 | Discharge: 2023-06-19 | Disposition: A | Discharge status: Home / Self Care | Payer: No Typology Code available for payment source | Source: Ambulatory Visit | Attending: Vascular Surgery | Admitting: Vascular Surgery

## 2023-06-19 VITALS — BP 122/71 | HR 92 | Ht 65.0 in | Wt 320.4 lb

## 2023-06-19 DIAGNOSIS — Z794 Long term (current) use of insulin: Secondary | ICD-10-CM

## 2023-06-19 DIAGNOSIS — M25473 Effusion, unspecified ankle: Secondary | ICD-10-CM | POA: Diagnosis not present

## 2023-06-19 DIAGNOSIS — L97909 Non-pressure chronic ulcer of unspecified part of unspecified lower leg with unspecified severity: Secondary | ICD-10-CM | POA: Diagnosis not present

## 2023-06-19 DIAGNOSIS — L97901 Non-pressure chronic ulcer of unspecified part of unspecified lower leg limited to breakdown of skin: Secondary | ICD-10-CM | POA: Diagnosis not present

## 2023-06-19 LAB — VAS US ABI WITH/WO TBI
Left ABI: 0.86
Right ABI: 0.95

## 2023-06-19 MED ORDER — DULAGLUTIDE 3 MG/0.5ML ~~LOC~~ SOAJ: 3.0000 mg | SUBCUTANEOUS | 3 refills | Status: DC

## 2023-06-19 MED ORDER — DAPAGLIFLOZIN PROPANEDIOL 10 MG PO TABS
10.0000 mg | ORAL_TABLET | Freq: Every day | ORAL | 3 refills | Status: DC
Start: 2023-06-19 — End: 2023-06-20

## 2023-06-19 NOTE — Progress Notes (Unsigned)
    SUBJECTIVE:   CHIEF COMPLAINT / HPI:   BLE Venous Stasis Wounds Here today to follow-up on her bilateral venous stasis wounds.  We have been doing twice weekly dressing changes with DuoDERM and compression.  She continues to have mild pain of the legs when she is standing, though elevating the legs when she is at home seems to help with this. She is scheduled for ABIs later today.   OBJECTIVE:   BP 122/71   Pulse 92   Ht 5\' 5"  (1.651 m)   Wt (!) 320 lb 6.4 oz (145.3 kg)   SpO2 96%   BMI 53.32 kg/m   Gen: In good spirits and NAD  Resp: Normal WOB on RA Ext: BLE with chronic ulcers, these do appear to be more shallow today compared to my last exam. No purulent discharge or surrounding erythema to suggest infection.        ASSESSMENT/PLAN:   Assessment & Plan Ulcer of lower extremity, unspecified laterality, unspecified ulcer stage (HCC) Appears to be healing well, albeit slowly.  Had consider transition to Unna boots today, but given the interval improvement, will continue with our current management plan of DuoDERM and compression. -Has formal ABIs later today -Return on Friday 3/7 for RN visit for dressing change -Has follow-up with Dr. Yetta Barre on 3/11 -Wound care is scheduled for 4/3.  We will continue twice weekly dressing changes until she can get established with wound care.     [name], MD [facility]

## 2023-06-19 NOTE — Patient Instructions (Addendum)
 [name],  Let's keep doing what we've been doing. If the vascular folks need to remove your dressing, please  make a nurse appointment for tomorrow to come and get it re-dressed. Otherwise, you can come back for a dressing change on Friday. Please make that nurse-only visit on your way out today.   [name], MD

## 2023-06-20 ENCOUNTER — Encounter: Payer: Self-pay | Admitting: Student

## 2023-06-20 ENCOUNTER — Other Ambulatory Visit: Payer: Self-pay | Admitting: Student

## 2023-06-20 DIAGNOSIS — E1165 Type 2 diabetes mellitus with hyperglycemia: Secondary | ICD-10-CM

## 2023-06-20 MED ORDER — DULAGLUTIDE 3 MG/0.5ML ~~LOC~~ SOAJ: 3.0000 mg | SUBCUTANEOUS | 3 refills | Status: DC

## 2023-06-20 MED ORDER — DAPAGLIFLOZIN PROPANEDIOL 10 MG PO TABS: 10.0000 mg | ORAL_TABLET | Freq: Every day | ORAL | 3 refills | Status: DC

## 2023-06-20 NOTE — Assessment & Plan Note (Signed)
 Appears to be healing well, albeit slowly.  Had consider transition to Unna boots today, but given the interval improvement, will continue with our current management plan of DuoDERM and compression. -Has formal ABIs later today -Return on Friday 3/7 for RN visit for dressing change -Has follow-up with Dr. Yetta Barre on 3/11 -Wound care is scheduled for 4/3.  We will continue twice weekly dressing changes until she can get established with wound care.

## 2023-06-21 ENCOUNTER — Telehealth: Payer: Self-pay

## 2023-06-21 ENCOUNTER — Other Ambulatory Visit (HOSPITAL_COMMUNITY): Payer: Self-pay

## 2023-06-21 NOTE — Telephone Encounter (Signed)
 Pharmacy Patient Advocate Encounter   Received notification from CoverMyMeds that prior authorization for TRULICITY 3MG  is required/requested.   Insurance verification completed.   The patient is insured through [facility] .   PA required; PA submitted to above mentioned insurance via CoverMyMeds Key/confirmation #/EOC [id number]. Status is pending

## 2023-06-21 NOTE — Telephone Encounter (Signed)
 Pharmacy Patient Advocate Encounter   Received notification from CoverMyMeds that prior authorization for [facility] is required/requested.   Insurance verification completed.   The patient is insured through [facility] .   PA required; PA submitted to above mentioned insurance via CoverMyMeds Key/confirmation #/EOC [id number]. Status is pending

## 2023-06-22 ENCOUNTER — Ambulatory Visit

## 2023-06-22 ENCOUNTER — Other Ambulatory Visit (HOSPITAL_COMMUNITY): Payer: Self-pay

## 2023-06-22 DIAGNOSIS — Z5189 Encounter for other specified aftercare: Secondary | ICD-10-CM

## 2023-06-22 MED ORDER — ASPIRIN 81 MG PO TBEC: 81.0000 mg | DELAYED_RELEASE_TABLET | Freq: Every day | ORAL | 12 refills | Status: AC

## 2023-06-22 NOTE — Addendum Note (Signed)
 Addended by: Darnelle Spangle B on: 06/22/2023 12:55 PM   Modules accepted: Orders

## 2023-06-22 NOTE — Telephone Encounter (Signed)
 Pharmacy Patient Advocate Encounter  Received notification from [facility] that Prior Authorization for TRULICITY has been  N/A  Per insurance: this member has alternative pharmacy benefits. [employer] is the payer of last resort. Please have the pharmacy bill the member's other insurance plan first. If the member feels that this is in error, please have the member call Member Services at [phone].  Reached out to patient via mychart.  PA #/Case ID/Reference #: [id number]

## 2023-06-22 NOTE — Progress Notes (Signed)
 Patient presents to nurse clinic for wound care of bilateral lower extremities.   Purulent, bloody drainage noted on duoderm bandages. Patient denies fever or chills.   Dr. Pollie Meyer evaluated areas and took picture for chart.   Received verbal orders to continue wound care with duoderm dressings and coban wraps.   Patient has follow up with Dr. Yetta Barre scheduled for 06/26/23.

## 2023-06-22 NOTE — Telephone Encounter (Signed)
 Pharmacy Patient Advocate Encounter  Received notification from [facility] that Prior Authorization for Marcelline Deist has been  N/A  Per insurance: this member has alternative pharmacy benefits. [employer] is the payer of last resort. Please have the pharmacy bill the member's other insurance plan first. If the member feels that this is in error, please have the member call Member Services at [phone].  Reached out to pt via mychart.  PA #/Case ID/Reference #: [id number]

## 2023-06-24 ENCOUNTER — Other Ambulatory Visit: Payer: Self-pay | Admitting: Student

## 2023-06-24 DIAGNOSIS — E1165 Type 2 diabetes mellitus with hyperglycemia: Secondary | ICD-10-CM

## 2023-06-25 ENCOUNTER — Other Ambulatory Visit (HOSPITAL_COMMUNITY): Payer: Self-pay

## 2023-06-25 NOTE — Telephone Encounter (Signed)
 Attempting 2nd PA since patient says she spoke to [facility] about not having a primary coverage.   Key [id number]

## 2023-06-25 NOTE — Telephone Encounter (Signed)
 Attempting 2nd PA since patient says she spoke to [facility] about not having a primary coverage.    Key [id number]

## 2023-06-26 ENCOUNTER — Ambulatory Visit: Payer: Self-pay | Admitting: Student

## 2023-06-26 ENCOUNTER — Encounter: Payer: Self-pay | Admitting: Student

## 2023-06-26 VITALS — BP 150/81 | HR 94 | Ht 65.0 in | Wt 326.8 lb

## 2023-06-26 DIAGNOSIS — Z794 Long term (current) use of insulin: Secondary | ICD-10-CM

## 2023-06-26 DIAGNOSIS — L97909 Non-pressure chronic ulcer of unspecified part of unspecified lower leg with unspecified severity: Secondary | ICD-10-CM | POA: Diagnosis not present

## 2023-06-26 DIAGNOSIS — I739 Peripheral vascular disease, unspecified: Secondary | ICD-10-CM

## 2023-06-26 DIAGNOSIS — E1165 Type 2 diabetes mellitus with hyperglycemia: Secondary | ICD-10-CM

## 2023-06-26 MED ORDER — BASAGLAR KWIKPEN 100 UNIT/ML ~~LOC~~ SOPN: 44.0000 [IU] | PEN_INJECTOR | Freq: Every day | SUBCUTANEOUS | Status: DC

## 2023-06-26 NOTE — Progress Notes (Unsigned)
    SUBJECTIVE:   CHIEF COMPLAINT / HPI:   Venous stasis ulcers Has been coming for twice weekly bandage changes for venous stasis ulcers of bilateral extremities. Denies any fever, chills, increased pain today ABIs showed mild PAD and she was started on aspirin 81 mg daily Has wound care appointment in April  T2DM At last visit, basal insulin was increased to 42 units daily and she was continued on 8 units of Humalog with meals in addition to metformin, Farxiga, dulaglutide Today patient states fasting and mealtime sugars have been ranging from 140s to 150s  PERTINENT  PMH / PSH: HTN, T2DM, HLD  OBJECTIVE:   BP (!) 150/81   Pulse 94   Ht 5\' 5"  (1.651 m)   Wt (!) 326 lb 12.8 oz (148.2 kg)   SpO2 98%   BMI 54.38 kg/m    General: NAD, pleasant, able to participate in exam Cardiac: RRR, no murmurs. Respiratory: Breathing comfortably on room air Extremities: Ulcers of bilateral lower extremities draining copious amounts of serosanguineous blood-tinged fluid.  No surrounding edema, erythema or warmth Neuro: alert, no obvious focal deficits Psych: Normal affect and mood  ASSESSMENT/PLAN:   Type 2 diabetes mellitus with hyperglycemia (HCC) Like to obtain optimal blood glucose control to prevent infection of leg ulcers and promote healing -Increase Basaglar to 44 units daily -Continue Humalog 8 units daily before meals, dulaglutide 3 mg weekly, Farxiga 10 mg daily, metformin 500 mg twice daily  Ulcer of lower extremity (HCC) Not much change in venous stasis ulcer since last visit.  Will trial Xeroform gauze over ulcers today which were then wrapped with gauze and covered in Coban from foot to mid shin.  No signs of infection today. -Patient to return in 3 days for RN wound check, can reapply Xeroform dressing if appropriate -Appointment made with physician for 07/06/2023 for wound check and dressing change -Will continue either twice weekly or weekly dressing changes until  patient can get in with wound care  PAD (peripheral artery disease) (HCC) Mild PAD of left extremity -Continue aspirin 81 mg daily -Referral placed for vascular surgery to establish care -Patient is already on a statin.  Considered lipid panel today but she did not want to stay for labs, can consider checking at future visit     Dr. Erick Alley, DO [facility]

## 2023-06-26 NOTE — Patient Instructions (Addendum)
 It was great to see you! Thank you for allowing me to participate in your care!  I recommend that you always bring your medications to each appointment as this makes it easy to ensure you are on the correct medications and helps [place] not miss when refills are needed.  Our plans for today:  - Return on Friday for RN wound check - Return on 3/21 for visit with doctor - you will be called to schedule apt with vascular  -increase long acting insulin to 44 U daily   We are checking some labs today, I will call you if they are abnormal will send you a MyChart message or a letter if they are normal.  If you do not hear about your labs in the next 2 weeks please let us know.  Take care and seek immediate care sooner if you develop any concerns.   Dr. Erick Alley, DO [facility]

## 2023-06-27 DIAGNOSIS — I739 Peripheral vascular disease, unspecified: Secondary | ICD-10-CM | POA: Insufficient documentation

## 2023-06-27 MED ORDER — HUMALOG KWIKPEN 200 UNIT/ML ~~LOC~~ SOPN: 8.0000 [IU] | PEN_INJECTOR | Freq: Three times a day (TID) | SUBCUTANEOUS | Status: DC

## 2023-06-27 NOTE — Assessment & Plan Note (Addendum)
 Mild PAD of left extremity -Continue aspirin 81 mg daily -Referral placed for vascular surgery to establish care -Patient is already on a statin.  Considered lipid panel today but she did not want to stay for labs, can consider checking at future visit

## 2023-06-27 NOTE — Assessment & Plan Note (Signed)
 Like to obtain optimal blood glucose control to prevent infection of leg ulcers and promote healing -Increase Basaglar to 44 units daily -Continue Humalog 8 units daily before meals, dulaglutide 3 mg weekly, Farxiga 10 mg daily, metformin 500 mg twice daily

## 2023-06-27 NOTE — Assessment & Plan Note (Addendum)
 Not much change in venous stasis ulcer since last visit.  Will trial Xeroform gauze over ulcers today which were then wrapped with gauze and covered in Coban from foot to mid shin.  No signs of infection today. -Patient to return in 3 days for RN wound check, can reapply Xeroform dressing if appropriate -Appointment made with physician for 07/06/2023 for wound check and dressing change -Will continue either twice weekly or weekly dressing changes until patient can get in with wound care

## 2023-06-28 NOTE — Telephone Encounter (Signed)
 Pharmacy Patient Advocate Encounter  Received notification from [facility] that Prior Authorization for TRULICITY has been APPROVED from 06/25/23 to 06/24/24   PA #/Case ID/Reference #: [id number]

## 2023-06-28 NOTE — Telephone Encounter (Signed)
 Pharmacy Patient Advocate Encounter  Received notification from [facility] that Prior Authorization for FARXIGA 10MG  has been APPROVED from 06/25/23 to 06/24/24

## 2023-06-29 ENCOUNTER — Ambulatory Visit (INDEPENDENT_AMBULATORY_CARE_PROVIDER_SITE_OTHER)

## 2023-06-29 DIAGNOSIS — Z5189 Encounter for other specified aftercare: Secondary | ICD-10-CM

## 2023-06-29 NOTE — Progress Notes (Signed)
 Here for wound care with the RN team.   Her wound was dressed last week with Xerofoam. Much improved since the last visit. She endorsed less drainage from her wound.  Neg tenderness of her feet and ankles. No erythema or signs of infection. She does have non-pitting edema of her feet B/L, which might be part of her venous stasis. Endorsed some skin flakiness secondary to recurrent dressing. Plan to try an ace bandage instead of a cobane and make it loose. F/U in 1 week or sooner for wound care.  She has a wound care appointment with a wound specialist on 07/19/23.

## 2023-06-29 NOTE — Patient Instructions (Signed)
   [facility] Diagnosis and Treatment of Childhood Mood Disorders, ADHD, Autism, and Developmental Delay  [address], [address] [place],  [place]  [address] Get Driving Directions Main: [phone]  Assessments for ADHD and Therapy for Children  [facility]: [phone] [facility] [address], [place], [place] [address]  [phone]  [phone]  [facility]- [facility] for Mental Health Disorders  This also provides regular therapy at low cost for children Therapists speak Spanish and English  [address], [place], [place] [address] Monday - Friday: 8:30am-12:00pm / 1:00pm-2:30pm  

## 2023-07-02 NOTE — Progress Notes (Signed)
 Patient presents to nurse clinic for wound care.   See provider note.   Dressed legs with Xeroform, Duoderm and loosely wrapped with coban.   Patient requests to schedule follow up nursing visit on Tuesday, 07/03/23 for wound care.   Veronda Prude, RN

## 2023-07-03 ENCOUNTER — Ambulatory Visit

## 2023-07-03 DIAGNOSIS — Z5189 Encounter for other specified aftercare: Secondary | ICD-10-CM

## 2023-07-03 NOTE — Progress Notes (Signed)
 Patient presents to nurse clinic for wound check.   Patient denies any swelling, redness, fevers or chills. Wounds improved and starting to scab over.   Precepted with Dr. Lum Babe. Dressed legs with Xeroform, Duoderm and loosely wrapped with coban.   Patient has an ATC apt for provider wound check on 3/21.

## 2023-07-06 ENCOUNTER — Ambulatory Visit: Payer: Self-pay

## 2023-07-06 VITALS — BP 129/86 | HR 91 | Temp 99.3°F | Ht 65.0 in | Wt 318.1 lb

## 2023-07-06 DIAGNOSIS — Z5189 Encounter for other specified aftercare: Secondary | ICD-10-CM

## 2023-07-06 NOTE — Patient Instructions (Addendum)
 It was great to see you! Thank you for allowing me to participate in your care!   Our plans for today:  - We will rewrap your wound - Follow up with nurse visit on 3/25  Take care and seek immediate care sooner if you develop any concerns.  Levin Erp, MD

## 2023-07-06 NOTE — Progress Notes (Signed)
    SUBJECTIVE:   CHIEF COMPLAINT / HPI: Wound f/u  Discussed the use of AI scribe software for clinical note transcription with the patient, who gave verbal consent to proceed.  Has been taking twice weekly bandage for changes for venous stasis ulcers. ABI showed mild PAD.  Was trialed on Xeroform gauze over ulcers which is wrapped with gauze and covered in Coban from to mid shin.  Had 2 additional check visits by nursing 3/14, 3/18.  She does have appt with wound care center 07/19/23.  History of Present Illness Wound dressings are changed twice a week here, with the last change occurring on Tuesday. The patient's condition sometimes causes discomfort, although the regular dressing changes sometimes alleviate the discomfort. Reports that the condition appears to be improving, although there are still areas of concern. No fevers or systemic symptoms   PERTINENT  PMH / PSH: PAD, HTN, asthma, T2DM  OBJECTIVE:   BP 129/86   Pulse 91   Temp 99.3 F (37.4 C) (Oral)   Ht 5\' 5"  (1.651 m)   Wt (!) 318 lb 2 oz (144.3 kg)   SpO2 97%   BMI 52.94 kg/m   Physical Exam General: Well appearing, NAD, awake, alert, responsive to questions Head: Normocephalic atraumatic Respiratory: chest rises symmetrically,  no increased work of breathing Extremities: b/l heels and calves with ulcers present, seems to be improving, no significant discharge    ASSESSMENT/PLAN:   Assessment & Plan Visit for wound check Bilateral leg wounds, improving - Continue dressing changes twice a week - next 3/25 with RN  - RN applied xeroform and Duoderm for padding with coban around - F/u wound care 4/3   Levin Erp, MD [facility]

## 2023-07-10 ENCOUNTER — Ambulatory Visit (INDEPENDENT_AMBULATORY_CARE_PROVIDER_SITE_OTHER)

## 2023-07-10 DIAGNOSIS — Z5189 Encounter for other specified aftercare: Secondary | ICD-10-CM

## 2023-07-10 NOTE — Progress Notes (Signed)
 Patient presents to nurse clinic for wound care. Dr. Jennette Kettle evaluated areas and took pictures for chart. Received verbal orders to continue with wound care.   Dressed legs with Xeroform, Duoderm and loosely wrapped with coban.   Patient reports having appointment with Dermatology tomorrow. She has follow up with our office on Friday, 07/13/23 with Dr. Sharion Dove.   Veronda Prude, RN

## 2023-07-11 ENCOUNTER — Encounter: Payer: Self-pay | Admitting: Dermatology

## 2023-07-11 ENCOUNTER — Ambulatory Visit (INDEPENDENT_AMBULATORY_CARE_PROVIDER_SITE_OTHER): Admitting: Dermatology

## 2023-07-11 VITALS — BP 121/80

## 2023-07-11 DIAGNOSIS — T148XXA Other injury of unspecified body region, initial encounter: Secondary | ICD-10-CM

## 2023-07-11 DIAGNOSIS — I872 Venous insufficiency (chronic) (peripheral): Secondary | ICD-10-CM

## 2023-07-11 DIAGNOSIS — S81809A Unspecified open wound, unspecified lower leg, initial encounter: Secondary | ICD-10-CM | POA: Diagnosis not present

## 2023-07-11 NOTE — Progress Notes (Signed)
 New Patient Visit   Subjective  Holly [name] is a 43 y.o. female who presents for the following: New Pt - Ulcer  The patient presents with chronic wounds on her lower legs that have been ongoing since January. She reports being managed by her family doctor and is scheduled to start with WoundCare on the 3rd. A biopsy was performed on the 14th, and she saw the doctor on the 25th, with results showing stasis dermatitis. The patient was diagnosed with stasis dermatitis and has been treated with doxycycline and another antibiotic for infection. She denies recurrent infection after initial treatment but notes slow healing. The patient receives wound care from her primary doctor twice weekly, involving bandage changes. Various dressings have been used, including thick bandages, xeroform gauze, coban, and Duoderm for padding. She reports that one area started scabbing up with a particular dressing. The patient wears compression stockings and elevates her legs most of the time, noting pain in the affected area when not elevated and when touched. The patient attributes the initial wound to flea bites from her son's dog, which she initially mistook for mosquito bites. She reports scratching the area, leading to a secondary infection. A vascular consultation has been completed, revealing good blood flow in one foot and reduced flow in the other, though surgery was not recommended. The patient has implemented dietary changes and intermittent fasting, resulting in a 20-pound weight loss.  The patient has spots, moles and lesions to be evaluated, some may be new or changing and the patient may have concern these could be cancer.   The following portions of the chart were reviewed this encounter and updated as appropriate: medications, allergies, medical history  Review of Systems:  No other skin or systemic complaints except as noted in HPI or Assessment and Plan.  Objective  Well appearing patient in no  apparent distress; mood and affect are within normal limits.   A focused examination was performed of the following areas: lower leg   Relevant exam findings are noted in the Assessment and Plan.    Assessment & Plan   1. Chronic leg wounds with stasis dermatitis - Assessment: Patient presents with chronic wounds on lower legs since January 2025. Recent biopsy confirmed stasis dermatitis with no fungal infection. Previous bacterial infections treated with doxycycline. Current wound care includes twice-weekly dressing changes by primary care physician using various dressings. Vascular consultation revealed reduced blood flow in one foot, but surgery not indicated. Patient reports pain in wound areas. Delayed wound healing likely due to compromised blood flow.  - Plan:    Perform wound culture to rule out active bacterial infection    Apply Unna boot to leg with open wounds      Continue compression stockings    Advise patient to:     - Elevate legs when at home     - Perform heel raises when up and about     - Reduce salt intake     - Continue weight loss efforts (20 lbs lost so far)    Follow up with Wound Care on July 19, 2023    Follow up with dermatology in 2 weeks to assess progress    Defer to Wound Care recommendations for further management if they offer more advanced wound care options.  Follow-up with dermatology in 2 weeks and with Wound Care on July 19, 2023 for reassessment and continued management.  OPEN WOUND   Related Procedures Aerobic Culture  Return in about  2 weeks (around 07/25/2023) for wound follow up.    Documentation: I have reviewed the above documentation for accuracy and completeness, and I agree with the above.   I, Shirron Marcha Solders, CMA, am acting as scribe for [employer], DO.   Langston Reusing, DO

## 2023-07-11 NOTE — Patient Instructions (Addendum)
 Hello Holly [name],  Thank you for visiting today. Here is a summary of the key instructions:  - Wound Care:   - Keep legs elevated when at home   - Do heel raises when up and about to improve blood flow   - Continue twice-weekly wound care appointments with primary doctor   - Use compression stockings to aid in managing stasis dermatitis  - Medications:   - Apply bacitracin ointment to wounds before wrapping  - Treatments:   - Unna boot will be applied to the leg with open wounds   - Dermal cleanser and Coban wrap will be used on the healing leg  - Please follow up with wound care to see if they have any advanced wound healing options (whirlpool therapy etc)  if they don't then I recommend you returning here in 2 weeks for a new Unna boot  - Lifestyle Changes:   - Reduce salt intake   - Continue with weight loss efforts and intermittent fasting   - Monitor diabetic numbers, which have shown improvement  - Follow-up:   - Next appointment with Wound Care on Wednesday, April 3rd   - Follow-up appointment in two weeks to check progress  - Tests:   - Cultures will be taken from open wounds to check for bacterial growth  - Additional Notes:   - Wound care specialists may perform debridement to promote healing or offer more advanced treatment options.  They will remove the UnnaBoot we applied today.    Please reach out if you have any questions or concerns.  Warm regards,  Dr. Langston Reusing Dermatology     Important Information  Due to recent changes in healthcare laws, you may see results of your pathology and/or laboratory studies on MyChart before the doctors have had a chance to review them. We understand that in some cases there may be results that are confusing or concerning to you. Please understand that not all results are received at the same time and often the doctors may need to interpret multiple results in order to provide you with the best plan of care or course of  treatment. Therefore, we ask that you please give [place] 2 business days to thoroughly review all your results before contacting the office for clarification. Should we see a critical lab result, you will be contacted sooner.   If You Need Anything After Your Visit  If you have any questions or concerns for your doctor, please call our main line at [phone] If no one answers, please leave a voicemail as directed and we will return your call as soon as possible. Messages left after 4 pm will be answered the following business day.   You may also send [place] a message via MyChart. We typically respond to MyChart messages within 1-2 business days.  For prescription refills, please ask your pharmacy to contact our office. Our fax number is [phone].  If you have an urgent issue when the clinic is closed that cannot wait until the next business day, you can page your doctor at the number below.    Please note that while we do our best to be available for urgent issues outside of office hours, we are not available 24/7.   If you have an urgent issue and are unable to reach [place], you may choose to seek medical care at your doctor's office, retail clinic, urgent care center, or emergency room.  If you have a medical emergency, please immediately call 911 or  go to the emergency department. In the event of inclement weather, please call our main line at [phone] for an update on the status of any delays or closures.  Dermatology Medication Tips: Please keep the boxes that topical medications come in in order to help keep track of the instructions about where and how to use these. Pharmacies typically print the medication instructions only on the boxes and not directly on the medication tubes.   If your medication is too expensive, please contact our office at [phone] or send [place] a message through MyChart.   We are unable to tell what your co-pay for medications will be in advance as this is  different depending on your insurance coverage. However, we may be able to find a substitute medication at lower cost or fill out paperwork to get insurance to cover a needed medication.   If a prior authorization is required to get your medication covered by your insurance company, please allow [place] 1-2 business days to complete this process.  Drug prices often vary depending on where the prescription is filled and some pharmacies may offer cheaper prices.  The website www.goodrx.com contains coupons for medications through different pharmacies. The prices here do not account for what the cost may be with help from insurance (it may be cheaper with your insurance), but the website can give you the price if you did not use any insurance.  - You can print the associated coupon and take it with your prescription to the pharmacy.  - You may also stop by our office during regular business hours and pick up a GoodRx coupon card.  - If you need your prescription sent electronically to a different pharmacy, notify our office through [facility] or by phone at [phone]

## 2023-07-13 ENCOUNTER — Ambulatory Visit: Admitting: Family Medicine

## 2023-07-16 LAB — AEROBIC CULTURE

## 2023-07-16 LAB — SPECIMEN STATUS REPORT

## 2023-07-19 ENCOUNTER — Encounter (HOSPITAL_BASED_OUTPATIENT_CLINIC_OR_DEPARTMENT_OTHER): Payer: No Typology Code available for payment source | Attending: Internal Medicine | Admitting: Internal Medicine

## 2023-07-19 DIAGNOSIS — L97822 Non-pressure chronic ulcer of other part of left lower leg with fat layer exposed: Secondary | ICD-10-CM | POA: Insufficient documentation

## 2023-07-19 DIAGNOSIS — L97812 Non-pressure chronic ulcer of other part of right lower leg with fat layer exposed: Secondary | ICD-10-CM | POA: Insufficient documentation

## 2023-07-19 DIAGNOSIS — I87312 Chronic venous hypertension (idiopathic) with ulcer of left lower extremity: Secondary | ICD-10-CM | POA: Insufficient documentation

## 2023-07-19 DIAGNOSIS — I87311 Chronic venous hypertension (idiopathic) with ulcer of right lower extremity: Secondary | ICD-10-CM | POA: Insufficient documentation

## 2023-07-19 DIAGNOSIS — E11622 Type 2 diabetes mellitus with other skin ulcer: Secondary | ICD-10-CM | POA: Diagnosis not present

## 2023-07-20 ENCOUNTER — Encounter: Admitting: Vascular Surgery

## 2023-07-23 ENCOUNTER — Telehealth: Payer: Self-pay | Admitting: Pharmacist

## 2023-07-23 NOTE — Telephone Encounter (Signed)
 Patient contacted for follow-up of glucose control with on-going wound infection.  Patient reports she is going to wound care and her wound is improving.  She also reports majority of readings < 200  Medication Plan: - Continue current treatment plan  Scheduled follow-up with Dr. Yetta Barre in ~ 5 weeks for Diabetes Follow-up and A1C reassessment.    Total time with patient call and documentation of interaction: 12 minutes.

## 2023-07-24 ENCOUNTER — Encounter: Admitting: Vascular Surgery

## 2023-07-24 NOTE — Telephone Encounter (Signed)
 Reviewed and agree with Dr Macky Lower plan.

## 2023-07-26 ENCOUNTER — Encounter (HOSPITAL_BASED_OUTPATIENT_CLINIC_OR_DEPARTMENT_OTHER): Admitting: Internal Medicine

## 2023-07-26 DIAGNOSIS — I87311 Chronic venous hypertension (idiopathic) with ulcer of right lower extremity: Secondary | ICD-10-CM | POA: Diagnosis not present

## 2023-07-26 DIAGNOSIS — I87312 Chronic venous hypertension (idiopathic) with ulcer of left lower extremity: Secondary | ICD-10-CM

## 2023-07-26 DIAGNOSIS — L97812 Non-pressure chronic ulcer of other part of right lower leg with fat layer exposed: Secondary | ICD-10-CM | POA: Diagnosis not present

## 2023-07-26 DIAGNOSIS — E11622 Type 2 diabetes mellitus with other skin ulcer: Secondary | ICD-10-CM | POA: Diagnosis not present

## 2023-07-26 DIAGNOSIS — L97822 Non-pressure chronic ulcer of other part of left lower leg with fat layer exposed: Secondary | ICD-10-CM | POA: Diagnosis not present

## 2023-08-01 ENCOUNTER — Other Ambulatory Visit: Payer: Self-pay | Admitting: Student

## 2023-08-01 ENCOUNTER — Other Ambulatory Visit: Payer: Self-pay

## 2023-08-01 ENCOUNTER — Ambulatory Visit: Admitting: Dermatology

## 2023-08-01 ENCOUNTER — Encounter (HOSPITAL_BASED_OUTPATIENT_CLINIC_OR_DEPARTMENT_OTHER): Admitting: Internal Medicine

## 2023-08-01 DIAGNOSIS — E11622 Type 2 diabetes mellitus with other skin ulcer: Secondary | ICD-10-CM | POA: Diagnosis not present

## 2023-08-01 DIAGNOSIS — L97822 Non-pressure chronic ulcer of other part of left lower leg with fat layer exposed: Secondary | ICD-10-CM | POA: Diagnosis not present

## 2023-08-01 DIAGNOSIS — I87312 Chronic venous hypertension (idiopathic) with ulcer of left lower extremity: Secondary | ICD-10-CM | POA: Diagnosis not present

## 2023-08-01 DIAGNOSIS — I87311 Chronic venous hypertension (idiopathic) with ulcer of right lower extremity: Secondary | ICD-10-CM | POA: Diagnosis not present

## 2023-08-01 DIAGNOSIS — L97812 Non-pressure chronic ulcer of other part of right lower leg with fat layer exposed: Secondary | ICD-10-CM | POA: Diagnosis not present

## 2023-08-02 MED ORDER — BASAGLAR KWIKPEN 100 UNIT/ML ~~LOC~~ SOPN: 44.0000 [IU] | PEN_INJECTOR | Freq: Every day | SUBCUTANEOUS | 3 refills | Status: DC

## 2023-08-02 NOTE — Telephone Encounter (Signed)
 Patient returns call to nurse line regarding refill.   She reports that she is out of medication and is concerned about not having refill before our office closes for the holiday. Spoke with Dr. Koval regarding patient.   Advised that I could send to him for refill.   Elsie Halo, RN

## 2023-08-06 ENCOUNTER — Ambulatory Visit (INDEPENDENT_AMBULATORY_CARE_PROVIDER_SITE_OTHER): Admitting: Surgery

## 2023-08-06 VITALS — BP 121/77 | HR 78 | Temp 98.0°F | Ht 65.0 in | Wt 321.0 lb

## 2023-08-06 DIAGNOSIS — I83001 Varicose veins of unspecified lower extremity with ulcer of thigh: Secondary | ICD-10-CM | POA: Diagnosis not present

## 2023-08-06 DIAGNOSIS — L97101 Non-pressure chronic ulcer of unspecified thigh limited to breakdown of skin: Secondary | ICD-10-CM | POA: Diagnosis not present

## 2023-08-06 NOTE — Progress Notes (Signed)
 Vascular and Vein Specialist of [facility]  Patient name: Holly [name] MRN: [id number] DOB: 11-06-1980 Sex: female   REQUESTING PROVIDER:    Dr. Adeline Hone   REASON FOR CONSULT:    LE wounds  HISTORY OF PRESENT ILLNESS:   Holly [name] is a 43 y.o. female, who developed wounds on both of her feet from a spider bite back in January.  She recently saw the wound center who has placed her in compression.  She feels that they are getting better.  She denies claudication symptoms.  PAST MEDICAL HISTORY    Past Medical History:  Diagnosis Date   Eczema    Hx: of   Pregnancy    Pregnancy induced hypertension    Seasonal allergies    Hx: of     FAMILY HISTORY   Family History  Problem Relation Age of Onset   Hypertension Father    Hyperlipidemia Father    Lung cancer Paternal Grandmother    Diabetes Maternal Grandfather    Kidney disease Paternal Uncle    Colon cancer Neg Hx    Esophageal cancer Neg Hx    Stomach cancer Neg Hx     SOCIAL HISTORY:   Social History   Socioeconomic History   Marital status: Single    Spouse name: Not on file   Number of children: 1   Years of education: Not on file   Highest education level: Some college, no degree  Occupational History   Occupation: Herbalist   Tobacco Use   Smoking status: Never    Passive exposure: Past   Smokeless tobacco: Never  Vaping Use   Vaping status: Never Used  Substance and Sexual Activity   Alcohol use: No   Drug use: No   Sexual activity: Yes    Birth control/protection: I.U.D.  Other Topics Concern   Not on file  Social History Narrative   Not on file   Social Drivers of Health   Financial Resource Strain: Medium Risk (06/01/2023)   Overall Financial Resource Strain (CARDIA)    Difficulty of Paying Living Expenses: Somewhat hard  Food Insecurity: Food Insecurity Present (06/01/2023)   Hunger Vital Sign    Worried About Running Out of Food in  the Last Year: Sometimes true    Ran Out of Food in the Last Year: Sometimes true  Transportation Needs: No Transportation Needs (06/01/2023)   PRAPARE - Administrator, Civil Service (Medical): No    Lack of Transportation (Non-Medical): No  Physical Activity: Insufficiently Active (10/12/2022)   Exercise Vital Sign    Days of Exercise per Week: 2 days    Minutes of Exercise per Session: 10 min  Stress: No Stress Concern Present (06/01/2023)   [employer] of Occupational Health - Occupational Stress Questionnaire    Feeling of Stress : Not at all  Social Connections: Moderately Integrated (06/01/2023)   Social Connection and Isolation Panel [NHANES]    Frequency of Communication with Friends and Family: More than three times a week    Frequency of Social Gatherings with Friends and Family: Once a week    Attends Religious Services: More than 4 times per year    Active Member of [employer] or Organizations: Yes    Attends Banker Meetings: More than 4 times per year    Marital Status: Never married  Intimate Partner Violence: Unknown (07/20/2021)   Received from [employer]   HITS    Physically Hurt: Not  on file    Insult or Talk Down To: Not on file    Threaten Physical Harm: Not on file    Scream or Curse: Not on file    ALLERGIES:    Allergies  Allergen Reactions   Naproxen Sodium Other (See Comments)    Chest burns - denies airway involvement     CURRENT MEDICATIONS:    Current Outpatient Medications  Medication Sig Dispense Refill   Accu-Chek Softclix Lancets lancets Use daily 100 each 12   albuterol  (VENTOLIN  HFA) 108 (90 Base) MCG/ACT inhaler Inhale 2 puffs into the lungs every 6 (six) hours as needed for wheezing or shortness of breath. 1 each 2   amLODipine  (NORVASC ) 10 MG tablet TAKE 1 TABLET BY MOUTH EVERY DAY 30 tablet 5   aspirin  EC 81 MG tablet Take 1 tablet (81 mg total) by mouth daily. Swallow whole. 30 tablet 12    atorvastatin  (LIPITOR) 40 MG tablet Take 1 tablet (40 mg total) by mouth daily. 30 tablet 11   Blood Glucose Monitoring Suppl (ACCU-CHEK GUIDE) w/Device KIT Check blood sugar daily. 1 kit 0   cetirizine  (ZYRTEC ) 10 MG tablet TAKE 1 TABLET BY MOUTH EVERY DAY 90 tablet 3   Continuous Glucose Sensor (FREESTYLE LIBRE 3 SENSOR) MISC 1 Device by Does not apply route every 14 (fourteen) days. Place 1 sensor on the skin every 14 days. Use to check glucose continuously 2 each 11   dapagliflozin  propanediol (FARXIGA ) 10 MG TABS tablet Take 1 tablet (10 mg total) by mouth daily. 90 tablet 3   diclofenac  Sodium (VOLTAREN ) 1 % GEL APPLY 2 GRAMS TO AFFECTED AREA 4 TIMES A DAY 100 g 0   doxycycline  (VIBRAMYCIN ) 100 MG capsule Take 1 capsule (100 mg total) by mouth 2 (two) times daily. 10 capsule 0   Dulaglutide  3 MG/0.5ML SOAJ Inject 3 mg into the skin once a week. 2 mL 3   fluticasone  (FLONASE ) 50 MCG/ACT nasal spray Place 2 sprays into both nostrils daily. 16 g 6   glucose blood (ACCU-CHEK GUIDE) test strip Use as instructed 100 each 12   HYDROcodone -acetaminophen  (NORCO) 5-325 MG tablet Take 1 tablet by mouth every 6 (six) hours as needed for moderate pain (pain score 4-6). 15 tablet 0   ibuprofen  (ADVIL ) 800 MG tablet Take 1 tablet (800 mg total) by mouth every 8 (eight) hours as needed. 30 tablet 0   Insulin  Glargine (BASAGLAR  KWIKPEN) 100 UNIT/ML Inject 44 Units into the skin daily. 15 mL 3   insulin  lispro (HUMALOG  KWIKPEN) 200 UNIT/ML KwikPen Inject 8 Units into the skin 3 (three) times daily before meals.     Insulin  Pen Needle (BD PEN NEEDLE NANO 2ND GEN) 32G X 4 MM MISC Inject 1 Container into the skin in the morning, at noon, and at bedtime. 100 each 1   Lancets Misc. (ACCU-CHEK SOFTCLIX LANCET DEV) KIT 1 Device by Does not apply route daily. 1 kit 1   losartan -hydrochlorothiazide  (HYZAAR) 50-12.5 MG tablet Take 1 tablet by mouth daily. 90 tablet 3   metFORMIN  (GLUCOPHAGE -XR) 500 MG 24 hr tablet Take 1  tablet (500 mg total) by mouth 2 (two) times daily with a meal. 180 tablet 3   triamcinolone  ointment (KENALOG ) 0.1 % Apply 1 Application topically 2 (two) times daily. Do not use for more than two weeks at a time 60 g 1   No current facility-administered medications for this visit.    REVIEW OF SYSTEMS:   [X]  denotes positive finding, [ ]   denotes negative finding Cardiac  Comments:  Chest pain or chest pressure:    Shortness of breath upon exertion:    Short of breath when lying flat:    Irregular heart rhythm:        Vascular    Pain in calf, thigh, or hip brought on by ambulation:    Pain in feet at night that wakes you up from your sleep:     Blood clot in your veins:    Leg swelling:         Pulmonary    Oxygen at home:    Productive cough:     Wheezing:         Neurologic    Sudden weakness in arms or legs:     Sudden numbness in arms or legs:     Sudden onset of difficulty speaking or slurred speech:    Temporary loss of vision in one eye:     Problems with dizziness:         Gastrointestinal    Blood in stool:      Vomited blood:         Genitourinary    Burning when urinating:     Blood in urine:        Psychiatric    Major depression:         Hematologic    Bleeding problems:    Problems with blood clotting too easily:        Skin    Rashes or ulcers:        Constitutional    Fever or chills:     PHYSICAL EXAM:   Vitals:   08/06/23 0908  BP: 121/77  Pulse: 78  Temp: 98 F (36.7 C)  TempSrc: Temporal  SpO2: 98%  Weight: (!) 321 lb (145.6 kg)  Height: 5\' 5"  (1.651 m)    GENERAL: The patient is a well-nourished female, in no acute distress. The vital signs are documented above. CARDIAC: There is a regular rate and rhythm.  VASCULAR: Palpable pedal pulses.  I evaluated the saphenous vein with SonoSite and they were not dilated PULMONARY: Nonlabored respirations ABDOMEN: Soft and non-tender with normal pitched bowel sounds.   MUSCULOSKELETAL: There are no major deformities or cyanosis. NEUROLOGIC: No focal weakness or paresthesias are detected. SKIN: There are no ulcers or rashes noted. PSYCHIATRIC: The patient has a normal affect.  STUDIES:   I have reviewed the following: +-------+-----------+-----------+------------+------------+  ABI/TBIToday's ABIToday's TBIPrevious ABIPrevious TBI  +-------+-----------+-----------+------------+------------+  Right 0.95       0.90                                 +-------+-----------+-----------+------------+------------+  Left  0.86       0.75                                 +-------+-----------+-----------+------------+------------+  Right toe pressure: 132 Left toe pressure: 109 Waveforms are triphasic   ASSESSMENT and PLAN   Bilateral lower extremity wounds: These appear to be secondary to a spider bite.  Because of her obesity and edema, these have been slow to heal.  Fortunately, her ABIs are essentially normal with normal toe pressures and triphasic waveforms.  I do not think from an arterial perspective she will have issues with wound healing.  In addition I looked at her saphenous veins with  the SonoSite ultrasound and these were not markedly dilated which would suggest underlying venous insufficiency.  Therefore, I do not feel that she has any arterial or venous issues that will impact healing.  She should just continue with compression and elevation.   Marti Slates, MD, FACS Vascular and Vein Specialists of [facility] [phone] Pager [phone]

## 2023-08-09 ENCOUNTER — Ambulatory Visit (HOSPITAL_BASED_OUTPATIENT_CLINIC_OR_DEPARTMENT_OTHER): Admitting: Internal Medicine

## 2023-08-13 ENCOUNTER — Encounter (HOSPITAL_BASED_OUTPATIENT_CLINIC_OR_DEPARTMENT_OTHER): Admitting: Internal Medicine

## 2023-08-13 DIAGNOSIS — I87312 Chronic venous hypertension (idiopathic) with ulcer of left lower extremity: Secondary | ICD-10-CM | POA: Diagnosis not present

## 2023-08-13 DIAGNOSIS — E11622 Type 2 diabetes mellitus with other skin ulcer: Secondary | ICD-10-CM | POA: Diagnosis not present

## 2023-08-13 DIAGNOSIS — L97812 Non-pressure chronic ulcer of other part of right lower leg with fat layer exposed: Secondary | ICD-10-CM | POA: Diagnosis not present

## 2023-08-13 DIAGNOSIS — L97822 Non-pressure chronic ulcer of other part of left lower leg with fat layer exposed: Secondary | ICD-10-CM | POA: Diagnosis not present

## 2023-08-13 DIAGNOSIS — I87311 Chronic venous hypertension (idiopathic) with ulcer of right lower extremity: Secondary | ICD-10-CM | POA: Diagnosis not present

## 2023-08-31 ENCOUNTER — Ambulatory Visit: Admitting: Student

## 2023-08-31 NOTE — Progress Notes (Deleted)

## 2023-09-17 ENCOUNTER — Encounter: Payer: Self-pay | Admitting: Student

## 2023-09-17 ENCOUNTER — Ambulatory Visit: Admitting: Student

## 2023-09-17 VITALS — BP 132/78 | HR 91 | Ht 65.0 in | Wt 319.6 lb

## 2023-09-17 DIAGNOSIS — Z794 Long term (current) use of insulin: Secondary | ICD-10-CM | POA: Diagnosis not present

## 2023-09-17 DIAGNOSIS — E785 Hyperlipidemia, unspecified: Secondary | ICD-10-CM

## 2023-09-17 DIAGNOSIS — Z23 Encounter for immunization: Secondary | ICD-10-CM | POA: Diagnosis not present

## 2023-09-17 DIAGNOSIS — Z1159 Encounter for screening for other viral diseases: Secondary | ICD-10-CM | POA: Diagnosis not present

## 2023-09-17 DIAGNOSIS — E1165 Type 2 diabetes mellitus with hyperglycemia: Secondary | ICD-10-CM | POA: Diagnosis not present

## 2023-09-17 LAB — POCT GLYCOSYLATED HEMOGLOBIN (HGB A1C): HbA1c, POC (controlled diabetic range): 6.5 % (ref 0.0–7.0)

## 2023-09-17 NOTE — Progress Notes (Signed)
    SUBJECTIVE:   CHIEF COMPLAINT / HPI:   Holly [name] is a 43 year old female with diabetes who presents for diabetes management and follow-up.  She manages her diabetes with lifestyle changes and medications, including intermittent fasting, dietary monitoring, and regular exercise.   Her current medication regimen includes metformin , Humalog  insulin  with main meals at 8 units, 42 units of Basaglar  daily, 10 mg of Farxiga  daily, and weekly Trulicity  injections. Mealtime blood sugars range between 135 and 138 mg/dL, and fasting blood sugars are generally between 120 and 138 mg/dL.  PERTINENT  PMH / PSH: T2DM, obesity, HTN, PAD  OBJECTIVE:   BP 132/78   Pulse 91   Ht 5\' 5"  (1.651 m)   Wt (!) 319 lb 9.6 oz (145 kg)   SpO2 99%   BMI 53.18 kg/m    General: NAD, pleasant, able to participate in exam Cardiac: Well-perfused Respiratory: Normal effort Neuro: alert, no obvious focal deficits Psych: Normal affect and mood  ASSESSMENT/PLAN:   Type 2 diabetes mellitus with hyperglycemia (HCC) Type 2 diabetes with improved glycemic control. A1c reduced to 6.5%. Fasting and postprandial glucose levels within target. Current management effective. - Continue metformin , Humalog , Basaglar , Farxiga , and Trulicity  as in HPI. - Order urine microalbumin/creatinine ratio test. - Provide referral for diabetic eye exam. - Schedule follow-up in 3 months for A1c evaluation. - Instruct to report hypoglycemic episodes or symptoms. - Perform lipid panel and continue atorvastatin     Health maintenance Tdap given today Hep C ordered  Dr. Glenn Lange, DO [facility]

## 2023-09-17 NOTE — Patient Instructions (Signed)
 It was great to see you! Thank you for allowing me to participate in your care!  I recommend that you always bring your medications to each appointment as this makes it easy to ensure you are on the correct medications and helps us  not miss when refills are needed.  Our plans for today:  - Continue current medications - return in 3 months for next A1c - If you have lows (<100) or feel unwell, please return sooner - you will be contacted to schedule eye exam - please schedule mammogram   We are checking some labs today, I will call you if they are abnormal will send you a MyChart message or a letter if they are normal.  If you do not hear about your labs in the next 2 weeks please let us  know.  Take care and seek immediate care sooner if you develop any concerns.   Dr. Glenn Lange, DO [facility]

## 2023-09-18 ENCOUNTER — Ambulatory Visit: Payer: Self-pay | Admitting: Student

## 2023-09-18 LAB — LIPID PANEL
Chol/HDL Ratio: 4 ratio (ref 0.0–4.4)
Cholesterol, Total: 125 mg/dL (ref 100–199)
HDL: 31 mg/dL — ABNORMAL LOW (ref >39)
LDL Chol Calc (NIH): 78 mg/dL (ref 0–99)
Triglycerides: 79 mg/dL (ref 0–149)
VLDL Cholesterol Cal: 16 mg/dL (ref 5–40)

## 2023-09-18 LAB — HCV INTERPRETATION

## 2023-09-18 LAB — HCV AB W REFLEX TO QUANT PCR: HCV Ab: NONREACTIVE

## 2023-09-18 NOTE — Assessment & Plan Note (Signed)
 Type 2 diabetes with improved glycemic control. A1c reduced to 6.5%. Fasting and postprandial glucose levels within target. Current management effective. - Continue metformin , Humalog , Basaglar , Farxiga , and Trulicity  as in HPI. - Order urine microalbumin/creatinine ratio test. - Provide referral for diabetic eye exam. - Schedule follow-up in 3 months for A1c evaluation. - Instruct to report hypoglycemic episodes or symptoms. - Perform lipid panel and continue atorvastatin 

## 2023-09-19 LAB — MICROALBUMIN / CREATININE URINE RATIO
Creatinine, Urine: 109.4 mg/dL
Microalb/Creat Ratio: 54 mg/g{creat} — ABNORMAL HIGH (ref 0–29)
Microalbumin, Urine: 58.9 ug/mL

## 2023-10-05 ENCOUNTER — Telehealth: Payer: Self-pay

## 2023-10-05 NOTE — Telephone Encounter (Signed)
 Pharmacy Patient Advocate Encounter   Received notification from CoverMyMeds that prior authorization for Dapagliflozin  Propanediol 10MG  tablets (FARXIGA ) is required/requested.   Insurance verification completed.   The patient is insured through [facility] .   Per test claim: PA required; PA submitted to above mentioned insurance via CoverMyMeds Key/confirmation #/EOC [employer]. Status is pending

## 2023-10-08 ENCOUNTER — Other Ambulatory Visit (HOSPITAL_COMMUNITY): Payer: Self-pay

## 2023-10-08 NOTE — Telephone Encounter (Signed)
 Pharmacy Patient Advocate Encounter  Received notification from [facility] that Prior Authorization for Dapagliflozin  Propanediol 10MG  tablets has been DENIED.  Full denial letter will be uploaded to the media tab. See denial reason below.  Patient already has an approval on file for brand Farxiga  10mg  tabs.    PA #/Case ID/Reference #: [id number]

## 2023-11-02 DIAGNOSIS — H524 Presbyopia: Secondary | ICD-10-CM | POA: Diagnosis not present

## 2023-11-13 ENCOUNTER — Ambulatory Visit: Admitting: Family Medicine

## 2023-11-13 ENCOUNTER — Encounter: Payer: Self-pay | Admitting: Family Medicine

## 2023-11-13 VITALS — BP 135/80 | HR 83 | Ht 65.0 in | Wt 324.8 lb

## 2023-11-13 DIAGNOSIS — I10 Essential (primary) hypertension: Secondary | ICD-10-CM

## 2023-11-13 DIAGNOSIS — S81802A Unspecified open wound, left lower leg, initial encounter: Secondary | ICD-10-CM | POA: Diagnosis present

## 2023-11-13 DIAGNOSIS — R600 Localized edema: Secondary | ICD-10-CM | POA: Diagnosis not present

## 2023-11-13 MED ORDER — LOSARTAN POTASSIUM-HCTZ 100-25 MG PO TABS: 1.0000 | ORAL_TABLET | Freq: Every day | ORAL | 0 refills | Status: DC

## 2023-11-13 NOTE — Progress Notes (Cosign Needed)
    SUBJECTIVE:   CHIEF COMPLAINT / HPI:   Leg wounds  New wound on left leg starting a week ago. No fevers. Slight discharge from wound. Wound was scabbed. Scab fell off and now is open. Similar area to previous flea bites that she was treated for at wound care clinic in April. The flea bite wounds had completely healed soon after last wound care visit at the end of April. Hurts to touch but otherwise not bothersome. No significant diabetic neuropathy.  Patient says that she might of been scratching at it overnight or without noticing  Lower extremity edema Patient says she has had lower extremity edema for a long time now however cannot remember having it before starting amlodipine .  Denies orthopnea.  Denies dyspnea on exertion or dyspnea otherwise.  Has been using compression socks since going to wound care and tries to elevate her feet.  She does sit a lot at work which she thinks could be a reason for her lower extremity edema as well.  Hypertension Patient is on amlodipine  and Hyzaar.  Patient takes both of these regularly.  PERTINENT  PMH / PSH: Hypertension, ADD, diabetes type 2, obesity  OBJECTIVE:   BP 135/80   Pulse 83   Ht 5' 5 (1.651 m)   Wt (!) 324 lb 12.8 oz (147.3 kg)   SpO2 99%   BMI 54.05 kg/m   General: well appearing, in no acute distress, obese CV: RRR, radial pulses equal and palpable, dorsal pedal pulses palpable and equal Resp: Normal work of breathing on room air Neuro: Alert & Oriented x 4  Ext: 2+ pitting edema bilaterally, no erythema, slightly increased warmth bilaterally, left lower extremity with small circular ulcerated wound that is dry, 2 smaller scabbed wounds around this 1.  Picture in media tab  ASSESSMENT/PLAN:   Assessment & Plan Open wound of left lower leg, initial encounter Wound appears to be very similar to flea bite wounds that patient had earlier.  Wound does not appear to be infected at this time most likely that patient scratched  at it without realizing and prevented healing.  Lower extremity edema as well as diabetes could also slow healing. - Applied DuoDERM and instructed patient of dressing change instructions (change DuoDERM after 3 days, can apply Coban or compression stocking over to hold the DuoDERM) - Advised patient to have dog treated for fleas and to wash all of the sheets, clothing. - Follow-up for wound check in 1 week Bilateral lower extremity edema Concerned that bilateral lower extremity edema could be due to amlodipine .  No evidence of CHF at this time. - Discontinue amlodipine , follow-up in 2 weeks and monitor lower extremity edema Hypertension, unspecified type Controlled at this time however given possible side effect of amlodipine  will discontinue. - Discontinue amlodipine , increase Hyzaar to 100-12.5 mg from hyzaar 50-12.5 - Follow-up in 2 weeks for blood pressure check and BMP     Areta Saliva, MD [facility]

## 2023-11-13 NOTE — Patient Instructions (Addendum)
 It was wonderful to see you today.  Please bring ALL of your medications with you to every visit.   Today we talked about:  Leg wound- I believe this could be due to flea bites again. I have applied a hydrating dressing. Please change this in 3 days. Please have your dog seen by a vet and get it treated for fleas.   Follow up is in one week.   For your blood pressure I have taken you off of the amlodipine  please stop this. I have increased your hyzaar dose. We will check labs in two weeks.   Thank you for choosing [facility].   Please call [phone] with any questions about today's appointment.   Areta Saliva, MD  Family Medicine

## 2023-11-13 NOTE — Assessment & Plan Note (Signed)
 Controlled at this time however given possible side effect of amlodipine  will discontinue. - Discontinue amlodipine , increase Hyzaar to 100-25 mg - Follow-up in 2 weeks for blood pressure check and BMP

## 2023-11-15 ENCOUNTER — Telehealth: Payer: Self-pay | Admitting: Family Medicine

## 2023-11-15 MED ORDER — LOSARTAN POTASSIUM-HCTZ 100-12.5 MG PO TABS: 1.0000 | ORAL_TABLET | Freq: Every day | ORAL | 0 refills | Status: DC

## 2023-11-15 NOTE — Addendum Note (Signed)
 Addended by: Aldina Porta on: 11/15/2023 04:32 PM   Modules accepted: Orders

## 2023-11-15 NOTE — Telephone Encounter (Signed)
 Called patient to discuss change in medication recommendation. Given BP recheck was normal range in the office, will only increase losartan  part of hyzaar, since we are discontinuing amlodipine .   Changes include:  Discontinuing amlodipine   Hyzaar 50-12.5 --> hyzaar 100-12.5   Patient had doubled up her hyzaar for the last two days. I informed her I sent the new prescription which only has the doubled arb rather than both medications doubled. She understood and had no questions.

## 2023-11-19 ENCOUNTER — Other Ambulatory Visit: Payer: Self-pay | Admitting: *Deleted

## 2023-11-19 ENCOUNTER — Ambulatory Visit: Payer: Self-pay | Admitting: Family Medicine

## 2023-11-19 MED ORDER — BASAGLAR KWIKPEN 100 UNIT/ML ~~LOC~~ SOPN: 44.0000 [IU] | PEN_INJECTOR | Freq: Every day | SUBCUTANEOUS | 3 refills | Status: DC

## 2023-11-29 ENCOUNTER — Ambulatory Visit (INDEPENDENT_AMBULATORY_CARE_PROVIDER_SITE_OTHER): Payer: Self-pay | Admitting: Family Medicine

## 2023-11-29 ENCOUNTER — Encounter: Payer: Self-pay | Admitting: Family Medicine

## 2023-11-29 VITALS — BP 138/95 | HR 84 | Ht 65.0 in | Wt 317.5 lb

## 2023-11-29 DIAGNOSIS — Z794 Long term (current) use of insulin: Secondary | ICD-10-CM

## 2023-11-29 DIAGNOSIS — E119 Type 2 diabetes mellitus without complications: Secondary | ICD-10-CM

## 2023-11-29 DIAGNOSIS — S81802D Unspecified open wound, left lower leg, subsequent encounter: Secondary | ICD-10-CM | POA: Diagnosis present

## 2023-11-29 DIAGNOSIS — E1165 Type 2 diabetes mellitus with hyperglycemia: Secondary | ICD-10-CM

## 2023-11-29 DIAGNOSIS — I10 Essential (primary) hypertension: Secondary | ICD-10-CM | POA: Diagnosis not present

## 2023-11-29 MED ORDER — DULAGLUTIDE 3 MG/0.5ML ~~LOC~~ SOAJ: 3.0000 mg | SUBCUTANEOUS | 3 refills | Status: DC

## 2023-11-29 MED ORDER — BD PEN NEEDLE NANO 2ND GEN 32G X 4 MM MISC: 1.0000 | Freq: Three times a day (TID) | 1 refills | Status: DC

## 2023-11-29 MED ORDER — FREESTYLE LIBRE 3 PLUS SENSOR MISC: 2 refills | Status: DC

## 2023-11-29 NOTE — Patient Instructions (Addendum)
 It was wonderful to see you today.  Please bring ALL of your medications with you to every visit.   Today we talked about:  Leg wound - Please call wound center to make appointment. Continue using antibiotic cream.   [phone]  Leg swelling - I'm glad that this swelling has gone down so much! Continue to use stockings as needed and try to walk as much as possible.   Blood pressure - Please track your blood pressures at home every day and we will review them at the next visit.   Please follow up in 1 months   Thank you for choosing [facility].   Please call [phone] with any questions about today's appointment.  Please be sure to schedule follow up at the front desk before you leave today.   Areta Saliva, MD  Family Medicine    Blood Pressure Record Sheet To take your blood pressure, you will need a blood pressure machine. You can buy a blood pressure machine (blood pressure monitor) at your clinic, drug store, or online. When choosing one, consider: An automatic monitor that has an arm cuff. A cuff that wraps snugly around your upper arm. You should be able to fit only one finger between your arm and the cuff. A device that stores blood pressure reading results. Do not choose a monitor that measures your blood pressure from your wrist or finger. Follow your health care provider's instructions for how to take your blood pressure. To use this form: Take your blood pressure medications every day These measurements should be taken when you have been at rest for at least 10-15 min Take at least 2 readings with each blood pressure check. This makes sure the results are correct. Wait 1-2 minutes between measurements. Write down the results in the spaces on this form. Keep in mind it should always be recorded systolic over diastolic. Both numbers are important.  Repeat this every day for 2-3 weeks, or as told by your health care provider.  Make a follow-up  appointment with your health care provider to discuss the results.  Blood Pressure Log Date Medications taken? (Y/N) Blood Pressure Time of Day

## 2023-11-29 NOTE — Progress Notes (Signed)
    SUBJECTIVE:   CHIEF COMPLAINT / HPI:   Leg wound  Patient has open wound on left lower leg.  She has been following the wound care instructions that were given to her since last visit on July 29.  However patient says that her wound has started to have drainage which she did not have before.  She says that she cleaned the wound recently and applied an antibiotic ointment that was given to her by the wound care center when she was previously seen by them for similar wounds on bilateral legs.  She says that this helped a little bit.  Denies denies worsening pain.  HTN Patient has been taking new medication of losartan  hydrochlorothiazide  100-any adverse side effects.  Says that her leg swelling has greatly reduced since stopping amlodipine .  PERTINENT  PMH / PSH: HTN, PAD  OBJECTIVE:   BP (!) 140/81   Pulse 84   Ht 5' 5 (1.651 m)   Wt (!) 317 lb 8 oz (144 kg)   SpO2 98%   BMI 52.83 kg/m   General: well appearing, in no acute distress Cards: well perfused, dorsal pedal pulses intact, bilateral lower extremity trace pitting edema bilaterally  Resp: Normal work of breathing on room air Left leg:  2 cm ulcerated lesion with some pinpoint bleeding in the center, circumferential hyperpigmentation, slightly indurated, no fluctuance or purulence.  No other drainage. (Pictures in the media tab)   ASSESSMENT/PLAN:   Assessment & Plan Open wound of left lower leg, subsequent encounter Wound seems to have gotten larger.  Now is wet with some bleeding and mildly indurated whereas previously wound was dry.  Concerning that patient mentions some drainage.  Reassuring that there is no erythema or fluctuance. - Will hold off on antibiotics at this time - Redressed the wound and advised patient to follow-up with the wound center - Follow-up in 2 weeks Primary hypertension Blood pressure mostly controlled with new regimen however slightly elevated from goal.  Patient mentions that her blood  pressure has been normal at home thus will ask patient to keep blood pressure log for 2 weeks and follow-up blood pressure at next visit. - Follow-up blood pressure log at visit in 2 weeks, if still elevated will increase medication from losartan  hydrochlorothiazide  100-12.5 to losartan  hydrochlorothiazide  100-25 - BMP today     Areta Saliva, MD [facility]

## 2023-11-30 ENCOUNTER — Telehealth: Payer: Self-pay

## 2023-11-30 LAB — BASIC METABOLIC PANEL WITH GFR
BUN/Creatinine Ratio: 13 (ref 9–23)
BUN: 9 mg/dL (ref 6–24)
CO2: 25 mmol/L (ref 20–29)
Calcium: 9.7 mg/dL (ref 8.7–10.2)
Chloride: 97 mmol/L (ref 96–106)
Creatinine, Ser: 0.68 mg/dL (ref 0.57–1.00)
Glucose: 86 mg/dL (ref 70–99)
Potassium: 4.3 mmol/L (ref 3.5–5.2)
Sodium: 138 mmol/L (ref 134–144)
eGFR: 111 mL/min/1.73 (ref >59)

## 2023-11-30 NOTE — Assessment & Plan Note (Signed)
 Blood pressure mostly controlled with new regimen however slightly elevated from goal.  Patient mentions that her blood pressure has been normal at home thus will ask patient to keep blood pressure log for 2 weeks and follow-up blood pressure at next visit. - Follow-up blood pressure log at visit in 2 weeks, if still elevated will increase medication from losartan  hydrochlorothiazide  100-12.5 to losartan  hydrochlorothiazide  100-25 - BMP today

## 2023-11-30 NOTE — Telephone Encounter (Signed)
 PA pending. Awaiting chart notes to close.

## 2023-12-01 ENCOUNTER — Ambulatory Visit: Payer: Self-pay | Admitting: Family Medicine

## 2023-12-03 NOTE — Telephone Encounter (Signed)
 Prior authorization submitted for FREESTYLE LIBRE 3 PLUS SENSORS to [facility] Medicaid via Latent.   Key: [id number]

## 2023-12-05 NOTE — Telephone Encounter (Signed)
 Pharmacy Patient Advocate Encounter  Received notification from [facility] that Prior Authorization for FREESTYLE LIBRE 3 PLUS SENSORS has been APPROVED from 12/03/23 to 06/05/23   PA #/Case ID/Reference #: [id number]

## 2023-12-12 ENCOUNTER — Other Ambulatory Visit: Payer: Self-pay | Admitting: Family Medicine

## 2024-01-02 ENCOUNTER — Encounter (HOSPITAL_BASED_OUTPATIENT_CLINIC_OR_DEPARTMENT_OTHER): Attending: Internal Medicine | Admitting: Internal Medicine

## 2024-01-04 ENCOUNTER — Ambulatory Visit: Admitting: Family Medicine

## 2024-01-24 ENCOUNTER — Other Ambulatory Visit: Payer: Self-pay | Admitting: Family Medicine

## 2024-01-24 DIAGNOSIS — E1165 Type 2 diabetes mellitus with hyperglycemia: Secondary | ICD-10-CM

## 2024-02-06 ENCOUNTER — Other Ambulatory Visit: Payer: Self-pay | Admitting: Student

## 2024-02-06 ENCOUNTER — Other Ambulatory Visit: Payer: Self-pay | Admitting: Family Medicine

## 2024-02-06 DIAGNOSIS — E1165 Type 2 diabetes mellitus with hyperglycemia: Secondary | ICD-10-CM

## 2024-02-06 DIAGNOSIS — I10 Essential (primary) hypertension: Secondary | ICD-10-CM

## 2024-02-18 ENCOUNTER — Ambulatory Visit: Admitting: Family Medicine

## 2024-03-17 ENCOUNTER — Other Ambulatory Visit: Payer: Self-pay | Admitting: Family Medicine

## 2024-03-17 DIAGNOSIS — E1165 Type 2 diabetes mellitus with hyperglycemia: Secondary | ICD-10-CM

## 2024-03-17 DIAGNOSIS — E119 Type 2 diabetes mellitus without complications: Secondary | ICD-10-CM

## 2024-03-18 ENCOUNTER — Other Ambulatory Visit: Payer: Self-pay | Admitting: Family Medicine

## 2024-03-18 DIAGNOSIS — Z794 Long term (current) use of insulin: Secondary | ICD-10-CM

## 2024-03-20 ENCOUNTER — Telehealth: Payer: Self-pay

## 2024-03-20 NOTE — Telephone Encounter (Signed)
 Received call from [facility] for refill request on Humalog  and test strips.   Per chart review, patient should have refills at local [facility].   Called patient. Patient reports that she originally requested mail order pharmacy, however, has heard bad reviews. She would like to keep prescriptions at [facility].   She will call back if she would like to have prescriptions sent to an alternate pharmacy.   Chiquita JAYSON English, RN

## 2024-03-27 ENCOUNTER — Other Ambulatory Visit: Payer: Self-pay | Admitting: Family Medicine

## 2024-03-27 DIAGNOSIS — E119 Type 2 diabetes mellitus without complications: Secondary | ICD-10-CM
